# Patient Record
Sex: Female | Born: 1940 | Race: Black or African American | Hispanic: No | Marital: Married | Smoking: Never smoker
Health system: Southern US, Community
[De-identification: ages and names within clinical notes are randomized; demographics above are authoritative.]

## PROBLEM LIST (undated history)

## (undated) DIAGNOSIS — R5381 Other malaise: Secondary | ICD-10-CM

## (undated) DIAGNOSIS — G40901 Epilepsy, unspecified, not intractable, with status epilepticus: Secondary | ICD-10-CM

## (undated) DIAGNOSIS — I639 Cerebral infarction, unspecified: Secondary | ICD-10-CM

## (undated) DIAGNOSIS — E872 Acidosis, unspecified: Secondary | ICD-10-CM

## (undated) DIAGNOSIS — I951 Orthostatic hypotension: Secondary | ICD-10-CM

## (undated) DIAGNOSIS — R55 Syncope and collapse: Secondary | ICD-10-CM

## (undated) DIAGNOSIS — N1330 Unspecified hydronephrosis: Secondary | ICD-10-CM

## (undated) DIAGNOSIS — F0390 Unspecified dementia without behavioral disturbance: Secondary | ICD-10-CM

## (undated) DIAGNOSIS — R1310 Dysphagia, unspecified: Secondary | ICD-10-CM

## (undated) DIAGNOSIS — I4892 Unspecified atrial flutter: Secondary | ICD-10-CM

## (undated) DIAGNOSIS — D72829 Elevated white blood cell count, unspecified: Secondary | ICD-10-CM

## (undated) DIAGNOSIS — N189 Chronic kidney disease, unspecified: Secondary | ICD-10-CM

## (undated) DIAGNOSIS — I5032 Chronic diastolic (congestive) heart failure: Secondary | ICD-10-CM

## (undated) DIAGNOSIS — E860 Dehydration: Secondary | ICD-10-CM

## (undated) DIAGNOSIS — E43 Unspecified severe protein-calorie malnutrition: Secondary | ICD-10-CM

## (undated) DIAGNOSIS — J9600 Acute respiratory failure, unspecified whether with hypoxia or hypercapnia: Secondary | ICD-10-CM

## (undated) DIAGNOSIS — M8580 Other specified disorders of bone density and structure, unspecified site: Secondary | ICD-10-CM

## (undated) DIAGNOSIS — H409 Unspecified glaucoma: Secondary | ICD-10-CM

## (undated) DIAGNOSIS — I440 Atrioventricular block, first degree: Secondary | ICD-10-CM

## (undated) DIAGNOSIS — E785 Hyperlipidemia, unspecified: Secondary | ICD-10-CM

## (undated) DIAGNOSIS — R269 Unspecified abnormalities of gait and mobility: Secondary | ICD-10-CM

## (undated) DIAGNOSIS — I10 Essential (primary) hypertension: Secondary | ICD-10-CM

## (undated) DIAGNOSIS — E119 Type 2 diabetes mellitus without complications: Secondary | ICD-10-CM

## (undated) HISTORY — DX: Acidosis, unspecified: E87.20

## (undated) HISTORY — DX: Unspecified dementia without behavioral disturbance: F03.90

## (undated) HISTORY — DX: Hyperlipidemia, unspecified: E78.5

## (undated) HISTORY — DX: Essential (primary) hypertension: I10

## (undated) HISTORY — DX: Chronic kidney disease, unspecified: N18.9

## (undated) HISTORY — DX: Other malaise: R53.81

## (undated) HISTORY — DX: Orthostatic hypotension: I95.1

## (undated) HISTORY — DX: Unspecified atrial flutter: I48.92

## (undated) HISTORY — DX: Elevated white blood cell count, unspecified: D72.829

## (undated) HISTORY — DX: Unspecified glaucoma: H40.9

## (undated) HISTORY — DX: Acute respiratory failure, unspecified whether with hypoxia or hypercapnia: J96.00

## (undated) HISTORY — DX: Dysphagia, unspecified: R13.10

## (undated) HISTORY — PX: ABDOMINAL HYSTERECTOMY: SHX81

## (undated) HISTORY — DX: Unspecified severe protein-calorie malnutrition: E43

## (undated) HISTORY — DX: Cerebral infarction, unspecified: I63.9

## (undated) HISTORY — DX: Unspecified abnormalities of gait and mobility: R26.9

## (undated) HISTORY — DX: Type 2 diabetes mellitus without complications: E11.9

## (undated) HISTORY — DX: Acidosis: E87.2

## (undated) HISTORY — DX: Chronic diastolic (congestive) heart failure: I50.32

## (undated) HISTORY — DX: Unspecified hydronephrosis: N13.30

## (undated) HISTORY — DX: Epilepsy, unspecified, not intractable, with status epilepticus: G40.901

## (undated) HISTORY — DX: Other specified disorders of bone density and structure, unspecified site: M85.80

## (undated) HISTORY — DX: Atrioventricular block, first degree: I44.0

## (undated) HISTORY — DX: Dehydration: E86.0

---

## 1999-11-08 ENCOUNTER — Encounter: Payer: Self-pay | Admitting: Family Medicine

## 1999-11-08 ENCOUNTER — Ambulatory Visit (HOSPITAL_COMMUNITY): Admission: RE | Admit: 1999-11-08 | Discharge: 1999-11-08 | Payer: Self-pay | Admitting: Family Medicine

## 1999-11-14 ENCOUNTER — Encounter: Payer: Self-pay | Admitting: Family Medicine

## 1999-11-14 ENCOUNTER — Ambulatory Visit (HOSPITAL_COMMUNITY): Admission: RE | Admit: 1999-11-14 | Discharge: 1999-11-14 | Payer: Self-pay | Admitting: Family Medicine

## 1999-11-24 ENCOUNTER — Ambulatory Visit (HOSPITAL_COMMUNITY): Admission: RE | Admit: 1999-11-24 | Discharge: 1999-11-24 | Payer: Self-pay | Admitting: Family Medicine

## 1999-11-24 ENCOUNTER — Encounter: Payer: Self-pay | Admitting: Family Medicine

## 2003-11-07 ENCOUNTER — Emergency Department (HOSPITAL_COMMUNITY): Admission: EM | Admit: 2003-11-07 | Discharge: 2003-11-07 | Payer: Self-pay | Admitting: Emergency Medicine

## 2006-10-04 ENCOUNTER — Other Ambulatory Visit: Admission: RE | Admit: 2006-10-04 | Discharge: 2006-10-04 | Payer: Self-pay | Admitting: Family Medicine

## 2008-06-03 ENCOUNTER — Inpatient Hospital Stay (HOSPITAL_COMMUNITY)
Admission: RE | Admit: 2008-06-03 | Discharge: 2008-06-11 | Payer: Self-pay | Admitting: Physical Medicine & Rehabilitation

## 2008-06-03 ENCOUNTER — Ambulatory Visit: Payer: Self-pay | Admitting: Physical Medicine & Rehabilitation

## 2008-07-20 ENCOUNTER — Encounter
Admission: RE | Admit: 2008-07-20 | Discharge: 2008-07-20 | Payer: Self-pay | Admitting: Physical Medicine & Rehabilitation

## 2008-08-02 ENCOUNTER — Encounter
Admission: RE | Admit: 2008-08-02 | Discharge: 2008-10-31 | Payer: Self-pay | Admitting: Physical Medicine & Rehabilitation

## 2008-11-09 ENCOUNTER — Encounter
Admission: RE | Admit: 2008-11-09 | Discharge: 2008-11-11 | Payer: Self-pay | Admitting: Physical Medicine & Rehabilitation

## 2011-05-15 NOTE — Discharge Summary (Signed)
Dana Blair NO.:  [id number]   MEDICAL RECORD NO.:  [id number]          PATIENT TYPE:  IPS   LOCATION:  4004                         FACILITY:  [facility]   PHYSICIAN:  Dana Blair, M.D.   DATE OF BIRTH:  Apr 11, 1941   DATE OF ADMISSION:  06/03/2008  DATE OF DISCHARGE:  06/11/2008                               DISCHARGE SUMMARY   DISCHARGE DIAGNOSES:  1. Right caudate hemorrhage.  2. Dysphagia.  3. Diabetes mellitus.  4. Hypertension.  5. Hyperlipidemia.   HISTORY OF PRESENT ILLNESS:  This is a 70 year old [place] American  female with history of diabetes mellitus, the patient from [place],  who is on vacation in [place], [place] who was admitted to  [facility] in [place] with altered mental status  collapsed on 05/22/2008.  She was intubated in the emergency department.  Noted blood sugar of 862, hemoglobin A1c of 12.3.  Cranial CT scan  showed a right caudate hemorrhage with some intraventricular extension.  She was subsequently transferred to [facility] for management of  intracerebral hemorrhage.  Neurosurgery, Dr. Lyman Blair with conservative  care.  Followup cranial CT scan with subtle enlargement of the  ventricles, overall improved.  Blood pressures monitored with clonidine,  lisinopril, and Lopressor.  She was on a puree diet.   PAST MEDICAL HISTORY:  See discharge diagnoses.  No alcohol or tobacco.   ALLERGIES:  None.   SOCIAL HISTORY:  Married, lives with her husband, one level home with  family assistance.   MEDICATIONS:  Medications prior to admission, the patient could not  recall.   HOSPITAL COURSE:  The patient was admitted to inpatient rehab services  where therapy was initiated on a 3-hour daily basis consisting of  physical therapy, occupational therapy, speech therapy, and  rehabilitation nursing.  The following issues were addressed during the  patient's rehabilitation stay.  Pertaining to Ms.  Bergen's right  caudate hemorrhage, she continued to participate fully with her  therapies, progressing nicely.  She was minimal assist to supervision  without the use of assistive device for her mobility, supervision for  activities of daily living with moderate cues secondary to some  decreased cognition.  A safety belt initially was in place for her  overall safety.  Her diet was advanced to a regular thin liquid which  she tolerated well.  Her blood pressures were monitored while on  clonidine, Lopressor, and lisinopril.  She had no orthostatic changes.  However, she was stressed to follow up with Dr. Everardo Blair of [facility].  Her blood sugars had some mild elevations.  Her Glucophage had been increased to 850 mg twice daily on June 10, 2008.  She had been on statin drugs in the past.  However, old paperwork  had showed that this was discontinued due to a low LDL.  Again, she  would followup with her primary MD.   LABORATORY DATA:  Overall, her latest labs showed sodium 139, potassium  3.5, BUN 8, creatinine 0.7, hemoglobin 11.7, hematocrit 34.4, WBC of  10.1, and platelet 318,000.  Full family teaching was  completed and she  was discharged to home.   DISCHARGE MEDICATIONS:  1. Glucophage 850 mg twice daily.  2. Clonidine 0.2 mg 3 times daily.  3. Folic acid 1 mg twice daily.  4. Lopressor 50 mg every 6 hours.  5. Multivitamin daily.  6. Vitamin C 2 tablets daily.  7. Lisinopril 20 mg daily.  8. Amaryl 2 mg 3 tablets daily.   DIET:  Diabetic diet.   SPECIAL INSTRUCTIONS:  No drinking.  No driving.  No smoking.   FOLLOWUP:  Dr. Everardo Blair of [facility], Dr. Ellwood Blair  appointment to be made at the outpatient rehab service office.      Dana Blair, P.A.    ______________________________  Dana Blair, M.D.    DA/MEDQ  D:  06/10/2008  T:  06/10/2008  Job:  [id number]   cc:   Dana Signs A. Dana All, MD

## 2011-05-15 NOTE — H&P (Signed)
NAME:  Dana Blair, NOVAK NO.:  [id number]   MEDICAL RECORD NO.:  [id number]          PATIENT TYPE:  IPS   LOCATION:  4004                         FACILITY:  [facility]   PHYSICIAN:  Ellwood Dense, M.D.   DATE OF BIRTH:  November 12, 1941   DATE OF ADMISSION:  06/03/2008  DATE OF DISCHARGE:                              HISTORY & PHYSICAL   ADMITTING PHYSICIAN:  Herold Harms, MD   PRIMARY CARE PHYSICIAN:  Dr. Everardo All with .   HISTORY OF PRESENT ILLNESS:  Dana Blair is a 70 year old African  American female with history of diabetes mellitus and hypertension.  She  lives in [place] but was on vacation in the [place] part of the  state.  She [place] [facility] [place] with altered [place] status after having collapsed on May 22, 2008.  She was intubated in the emergency department with a blood sugar  of 862 and hemoglobin A1c of 12.3.  There was no report about initial  blood pressure on admission.   Cranial CT was positive for right caudate hemorrhage with some  intraventricular extension.  She was subsequently transferred to [facility] in [place] for management of intracranial hemorrhage.  She  was seen by Dr. Lyman Bishop, and they recommended conservative care from a  neurosurgical standpoint.  Followup cranial CT showed subtle enlargement  of ventricles with intraventricular component of blood being diminished.  Blood pressure was monitored on clonidine, lisinopril, and metoprolol.  She was on a pureed diet with a modified barium swallow to be completed.  She has had recent history of dyslipidemia but her statin was  discontinued secondary to low LDL level of 50.   The patient was evaluated by the rehabilitation physicians and felt to  be an appropriate candidate for inpatient rehabilitation.   REVIEW OF SYSTEMS:  Positive for reflux.   PAST MEDICAL HISTORY:  1. Hypertension.  2. Diabetes mellitus.  3.  Dyslipidemia.   FAMILY HISTORY:  Noncontributory.   SOCIAL HISTORY:  The patient is married, lives with her husband in a one  level-home locally in [place].  She does not use alcohol or tobacco.   FUNCTIONAL HISTORY PRIOR TO ADMISSION:  Independent per the patient's  reports.   ALLERGIES:  No known drug allergies.   MEDICATIONS:  Unknown at this time.   LABORATORY:  Recent hemoglobin was 12.2, hematocrit of 37.4, platelet  count of 185,000, and white count of 8.4.  Recent total cholesterol was  118 with LDL cholesterol of 50 as noted above.  Recent sodium was 134,  potassium 3.7, chloride 100, bicarbonate 30, BUN 12, and creatinine 1.0.   PHYSICAL EXAMINATION:  GENERAL:  Reasonably well-appearing elderly adult  female lying in bed in mild-to-no acute discomfort.  VITAL SIGNS:  Blood pressure is 204/92 with a pulse 78, respiratory rate  18, and temperature 98.8 with O2 saturation 98% on room air.  Weight was  58.5 kg.  HEENT:  Normocephalic and nontraumatic.  LUNGS:  Clear to auscultation bilaterally.  CARDIOVASCULAR:  Regular rate and rhythm, S1 and S2 without murmurs.  ABDOMEN:  Soft and nontender with positive bowel sounds.  NEUROLOGIC:  Alert and oriented x1 without cues and 2-3 with several  cues.  She was able to answer only the most simple questions and was not  sure that she was in [facility] or why she was there at the  time of this health problems.  She is unable to give any significant  medical history.  EXTREMITY:  Bilateral upper extremity exam shows 4-/5 strength  throughout.  Bulk and tone are normal and reflexes are 2+ and  symmetrical.  Sensation is intact to light touch throughout the  bilateral upper extremities.  Lower extremity exam shows 3+ to 4- over 5  strength throughout.  She had decreased coordinating movements and  decreased standing balance.  Sensation appears intact to light touch  throughout the bilateral upper and lower  extremities.   DIAGNOSES:  1. Status post right caudate hemorrhage, likely secondary to poorly      controlled hypertension.  2. Poorly controlled non-insulin dependent diabetes per admission labs      and hemoglobin A1c levels.  3. Dyslipidemia.   Presently, the patient has deficits in ADLs, transfers, ambulation,  cognition related to the above-noted right caudate hemorrhage.  She also  has a history of significant hypertension and diabetes mellitus with  probable poor control at home.   PLAN:  1. Admit to the rehabilitation unit for daily therapies to include      physical therapy for range of motion, strengthening, bed mobility,      transfers, pre-gait training, gait training, and equipment      evaluation.  2. Occupational therapy for range of motion, strengthening, ADLs,      cognitive/perceptual training, splinting and equipment evaluation.  3. Rehab nursing for skin care, wound care, and bowel and bladder      training as necessary.  4. Speech therapy for higher level communication and evaluation of      swallow as necessary with evaluation of communication aids.  5. Case management to assess home environment, assist with discharge      planning, and arrange for appropriate followup care.  6. Social work to assess family and social support and assist in      discharge planning.  7. Continue pureed, high-carbohydrate modified thin liquid diet.  8. Check admission labs including CBC and CMET in a.m. of June 04, 2008.  9. CBGs a.c. and at night.  10.Sliding-scale NovoLog insulin for blood sugars greater than 151.  11.Side rails up x4 for safety.  12.Vitamin C 1000 mg p.o. daily.  13.Clonidine 0.2 mg p.o. t.i.d.  14.Pepcid 20 mg p.o. b.i.d.  15.Folic acid 1 mg p.o. b.i.d.  16.Glimepiride 4 mg p.o. daily.  17.Lisinopril 10 mg p.o. daily.  18.Metformin 500 mg p.o. b.i.d.  19.Lopressor 50 mg p.o. q.6 h., holding for heart rate less than 60.  20.Multivitamin one p.o.  daily.  21.Routine turning to prevent skin breakdown.  22.Keep Foley tube for now.  23.Nystatin powder to sacral areas b.i.d. and p.r.n.  24.Extra barrier cream b.i.d. and p.r.n. to sacral area.   PROGNOSIS:  Fair.   ESTIMATED LENGTH OF STAY:  15-20 days.   GOALS:  Standby assist for ADLs, transfers, and ambulation.           ______________________________  Ellwood Dense, M.D.     DC/MEDQ  D:  06/04/2008  T:  06/04/2008  Job:  [id number]

## 2011-09-27 LAB — COMPREHENSIVE METABOLIC PANEL
ALT: 15
AST: 20
Albumin: 2.1 — ABNORMAL LOW
Alkaline Phosphatase: 51
BUN: 5 — ABNORMAL LOW
CO2: 32
Calcium: 8.3 — ABNORMAL LOW
Chloride: 102
Creatinine, Ser: 0.79
GFR calc Af Amer: >60
GFR calc non Af Amer: >60
Glucose, Bld: 130 — ABNORMAL HIGH
Potassium: 3 — ABNORMAL LOW
Sodium: 138
Total Bilirubin: 0.4
Total Protein: 5.1 — ABNORMAL LOW

## 2011-09-27 LAB — BASIC METABOLIC PANEL
CO2: 31
Chloride: 103
GFR calc Af Amer: >60
Glucose, Bld: 174 — ABNORMAL HIGH
Potassium: 3.5
Sodium: 139

## 2011-09-27 LAB — DIFFERENTIAL
Lymphocytes Relative: 13
Lymphs Abs: 1.3
Monocytes Relative: 7
Neutro Abs: 8 — ABNORMAL HIGH
Neutrophils Relative %: 79 — ABNORMAL HIGH

## 2011-09-27 LAB — CBC
HCT: 34.4 — ABNORMAL LOW
Hemoglobin: 11.7 — ABNORMAL LOW
MCHC: 34
MCV: 86
Platelets: 318
RBC: 4
RDW: 13.9
WBC: 10.1

## 2013-10-28 ENCOUNTER — Telehealth: Payer: Self-pay | Admitting: Cardiology

## 2013-10-28 NOTE — Telephone Encounter (Signed)
Walk In pt Form " Pt Needs Samples of Meds" gave to [facility]  10/28/13/KM

## 2013-10-30 ENCOUNTER — Telehealth: Payer: Self-pay

## 2013-10-30 NOTE — Telephone Encounter (Signed)
Gave samples to patient's son

## 2013-10-30 NOTE — Telephone Encounter (Signed)
Sent an assistance program application to patient for bystolic and vyitron for her son to fill out and return

## 2013-12-04 ENCOUNTER — Telehealth: Payer: Self-pay

## 2013-12-07 ENCOUNTER — Other Ambulatory Visit: Payer: Self-pay | Admitting: Cardiology

## 2013-12-07 MED ORDER — CLONIDINE HCL 0.1 MG PO TABS: 0.0500 mg | ORAL_TABLET | Freq: Two times a day (BID) | ORAL | Status: DC

## 2013-12-10 NOTE — Telephone Encounter (Signed)
refill 

## 2013-12-30 ENCOUNTER — Telehealth: Payer: Self-pay

## 2013-12-30 NOTE — Telephone Encounter (Signed)
[name] came to office to get samples of bystolic and vytorin

## 2014-01-08 ENCOUNTER — Telehealth: Payer: Self-pay | Admitting: *Deleted

## 2014-01-08 NOTE — Telephone Encounter (Signed)
Patient needs refill on amlodipine and lisinopril to be sent to [facility] on [address]. Thanks, MI

## 2014-01-12 MED ORDER — LISINOPRIL-HYDROCHLOROTHIAZIDE 20-12.5 MG PO TABS: 1.0000 | ORAL_TABLET | Freq: Every day | ORAL | Status: DC

## 2014-01-12 MED ORDER — AMLODIPINE BESYLATE 10 MG PO TABS: 10.0000 mg | ORAL_TABLET | Freq: Every day | ORAL | Status: DC

## 2014-05-14 ENCOUNTER — Telehealth: Payer: Self-pay

## 2014-05-14 NOTE — Telephone Encounter (Signed)
[name] came to office to pick up bystolic and vytorin gave them to him at the front

## 2014-06-17 ENCOUNTER — Telehealth: Payer: Self-pay | Admitting: Cardiology

## 2014-06-17 NOTE — Telephone Encounter (Signed)
[name] came by to get Bystolic and Vitorin samples.  He said he would come back later after you call him.

## 2014-06-18 ENCOUNTER — Telehealth: Payer: Self-pay

## 2014-06-18 NOTE — Telephone Encounter (Signed)
Pt has not been sen in this office.verified dosage of pt bystolic 20mg  daily  and vytorin 10-20mg  daily .pt [facility]. pt son walked in. Samples of bystolic and vytorin given.

## 2014-06-23 ENCOUNTER — Encounter: Payer: Self-pay | Admitting: Cardiology

## 2014-07-01 ENCOUNTER — Telehealth: Payer: Self-pay

## 2014-07-01 NOTE — Telephone Encounter (Signed)
Patient son came to office to pick up bystolic 20 mg and vytorin 10/20 mg placed up front

## 2014-07-16 ENCOUNTER — Ambulatory Visit: Payer: Self-pay | Admitting: Cardiology

## 2014-07-28 ENCOUNTER — Telehealth: Payer: Self-pay

## 2014-07-28 ENCOUNTER — Other Ambulatory Visit: Payer: Self-pay

## 2014-07-28 MED ORDER — AMLODIPINE BESYLATE 10 MG PO TABS: 10.0000 mg | ORAL_TABLET | Freq: Every day | ORAL | Status: DC

## 2014-07-28 NOTE — Telephone Encounter (Signed)
Yes just give enough till pts scheduled appt.

## 2014-08-13 ENCOUNTER — Encounter: Payer: Self-pay | Admitting: Cardiology

## 2014-08-13 ENCOUNTER — Ambulatory Visit (INDEPENDENT_AMBULATORY_CARE_PROVIDER_SITE_OTHER): Payer: Medicare Other | Admitting: Cardiology

## 2014-08-13 VITALS — BP 132/76 | HR 70 | Ht 64.0 in | Wt 134.0 lb

## 2014-08-13 DIAGNOSIS — I340 Nonrheumatic mitral (valve) insufficiency: Secondary | ICD-10-CM | POA: Insufficient documentation

## 2014-08-13 DIAGNOSIS — I10 Essential (primary) hypertension: Secondary | ICD-10-CM

## 2014-08-13 DIAGNOSIS — I059 Rheumatic mitral valve disease, unspecified: Secondary | ICD-10-CM

## 2014-08-13 DIAGNOSIS — I440 Atrioventricular block, first degree: Secondary | ICD-10-CM

## 2014-08-13 DIAGNOSIS — Z8673 Personal history of transient ischemic attack (TIA), and cerebral infarction without residual deficits: Secondary | ICD-10-CM

## 2014-08-13 DIAGNOSIS — E785 Hyperlipidemia, unspecified: Secondary | ICD-10-CM

## 2014-08-13 DIAGNOSIS — E119 Type 2 diabetes mellitus without complications: Secondary | ICD-10-CM | POA: Insufficient documentation

## 2014-08-13 NOTE — Progress Notes (Signed)
[address] [place]  [address] Phone: [phone] Fax:  [phone]  Date:  08/13/2014   ID:  Dana LeylandBarbara J Swander, DOB Jul 31, 1941, MRN [id number]  PCP:  Leanor RubensteinSUN,VYVYAN Y, MD   History of Present Illness: Scharlene GlossBarbara J Beightol is a 73 y.o. female with difficult to control hypertension, diabetes, prior CVA, hyperlipidemia here for followup hypertension.  Hypertension-very well controlled on multidrug regimen. She does have a mild first degree AV block on EKG and she is on beta blocker. We will carefully monitor. She's not having any syncopal symptoms or dizziness.  Murmur-echocardiogram performed on 7/13 demonstrated mild MR/TR. She has mild LVH with EF of 70%. Overall reassuring.  Hyperlipidemia-currently very well controlled with LDL of 85. Continuing with Vytorin     Wt Readings from Last 3 Encounters:  08/13/14 134 lb (60.782 kg)     Past Medical History  Diagnosis Date  . Diabetes     Diabetes mellitus with chronic kidney disease   . Hypertension     [facility]- ECHO 06/29/09 - LVH, normal EF, renal ultrasound-no renal artery stenosis  . Hyperlipidemia   . 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 She had concomitant respiratory failure. On May 22, 2008, CT of the head without contrast demonstrated a 9 x 20 mm acute hematoma within the head of the right caudate with intraventricular extension of hemorrhage. Neurosurgery consult-no ventriculostomy.  . Glaucoma   . Osteopenia   . Chronic kidney disease     Past Surgical History  Procedure Laterality Date  . Other surgical history  1970    Hysterectomy    Current Outpatient Prescriptions  Medication Sig Dispense Refill  . ALENDRONATE SODIUM PO Take by mouth.      Marland Kitchen. amLODipine (NORVASC) 10 MG tablet Take 1 tablet (10 mg total) by mouth daily.  30 tablet  0  . aspirin 81 MG tablet Take 81 mg by mouth daily.      . cloNIDine (CATAPRES) 0.1 MG tablet Take 0.5 tablets (0.05 mg total) by mouth  2 (two) times daily.  30 tablet  6  . Ezetimibe-Simvastatin (VYTORIN PO) Take by mouth.      Marland Kitchen. GLIPIZIDE PO Take by mouth.      Marland Kitchen. lisinopril-hydrochlorothiazide (PRINZIDE) 20-12.5 MG per tablet Take 1 tablet by mouth daily.  30 tablet  5  . Nebivolol HCl (BYSTOLIC PO) Take by mouth.      . SitaGLIPtin-MetFORMIN HCl (JANUMET PO) Take by mouth.       No current facility-administered medications for this visit.    Allergies:   No Known Allergies  Social History:  The patient  reports that she has never smoked. She does not have any smokeless tobacco history on file.   Family History  Problem Relation Age of Onset  . Diabetes Mother   . Diabetes Father   . Diabetes Sister     ROS:  Please see the history of present illness.   Denies any new strokelike symptoms, fevers, chills, orthopnea, PND   All other systems reviewed and negative.   PHYSICAL EXAM: VS:  BP 132/76  Pulse 70  Ht 5\' 4"  (1.626 m)  Wt 134 lb (60.782 kg)  BMI 22.99 kg/m2 Well nourished, well developed, in no acute distress HEENT: normal, [place]/AT, EOMI Neck: no JVD, normal carotid upstroke, no bruit Cardiac:  normal S1, S2; RRR; 2/6 apical systolic murmur Lungs:  clear to auscultation bilaterally, no  wheezing, rhonchi or raleskyphosis Abd: soft, nontender, no hepatomegaly, no bruits Ext: no edema, 2+ distal pulses Skin: warm and dry GU: deferred Neuro: no focal abnormalities noted, AAO x 3  EKG:  08/13/14-sinus rhythm, first degree AV block, PR interval 214 ms, poor R-wave progression    Labs: A1c-6.2, LDL 85  ASSESSMENT AND PLAN:  1. History of stroke-secondary prevention medications as above. No significant symptoms. 2. Hypertension-previously difficult to control. Currently doing very well on this regimen. 3. Mitral regurgitation-mild. Mild tricuspid regurgitation as well. Should be of no clinical significance. 4. Diabetes-well controlled. Dr. [name]. 5. Hyperlipidemia -- Vytorin 10/20. LDL 85. Continue. Post  stroke. 6. 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 Continue to monitor. I'm fine with her continuing with beta blocker. 7. One-year followup.  Signed, [name], MD [facility]  08/13/2014 9:03 AM

## 2014-08-13 NOTE — Patient Instructions (Signed)
The current medical regimen is effective;  continue present plan and medications.  Follow up in 1 year with Dr Skains.  You will receive a letter in the mail 2 months before you are due.  Please call us when you receive this letter to schedule your follow up appointment.  

## 2014-08-27 ENCOUNTER — Other Ambulatory Visit: Payer: Self-pay | Admitting: *Deleted

## 2014-08-27 MED ORDER — AMLODIPINE BESYLATE 10 MG PO TABS: 10.0000 mg | ORAL_TABLET | Freq: Every day | ORAL | Status: DC

## 2014-09-28 NOTE — Progress Notes (Signed)
Patient ID: [name], female   DOB: 1941-09-25, 73 y.o.   MRN: [id number] Patient son came to office to get samples of bystolic and vytorin. I gave a month of vytorin and just two weeks of bystolic this is all we had

## 2014-10-15 ENCOUNTER — Telehealth: Payer: Self-pay

## 2014-10-15 NOTE — Telephone Encounter (Signed)
Patient 's son came to office to pick up vytorin and bystolic, we have no bystolic but I gave him a month of vytorin

## 2014-10-29 ENCOUNTER — Telehealth: Payer: Self-pay | Admitting: *Deleted

## 2014-10-29 NOTE — Telephone Encounter (Signed)
Bystolic samples placed at the front desk for patient. 

## 2014-10-30 ENCOUNTER — Other Ambulatory Visit: Payer: Self-pay

## 2014-10-30 MED ORDER — CLONIDINE HCL 0.1 MG PO TABS: 0.0500 mg | ORAL_TABLET | Freq: Two times a day (BID) | ORAL | Status: DC

## 2014-11-02 ENCOUNTER — Other Ambulatory Visit: Payer: Self-pay

## 2014-11-02 MED ORDER — CLONIDINE HCL 0.1 MG PO TABS: 0.0500 mg | ORAL_TABLET | Freq: Two times a day (BID) | ORAL | Status: DC

## 2014-11-19 ENCOUNTER — Other Ambulatory Visit: Payer: Self-pay

## 2014-11-19 NOTE — Telephone Encounter (Signed)
Patient son came to the office to get samples of vytorin and bystolic. I gave him samples of vytorin but we were out of bystolic

## 2014-12-03 ENCOUNTER — Telehealth: Payer: Self-pay

## 2014-12-03 NOTE — Telephone Encounter (Signed)
[name] came to office to pick up samples of bystoilc

## 2015-02-25 ENCOUNTER — Telehealth: Payer: Self-pay

## 2015-02-25 NOTE — Telephone Encounter (Signed)
Patient son came to office  to pick up samples of vytorin 10/20 and bystolic 20 mg gave to son at front desk

## 2015-03-09 ENCOUNTER — Other Ambulatory Visit: Payer: Self-pay | Admitting: Cardiology

## 2015-06-15 ENCOUNTER — Encounter: Payer: Self-pay | Admitting: Cardiology

## 2015-06-21 ENCOUNTER — Other Ambulatory Visit: Payer: Self-pay

## 2015-06-21 MED ORDER — NEBIVOLOL HCL 20 MG PO TABS: 20.0000 mg | ORAL_TABLET | Freq: Every day | ORAL | Status: DC

## 2015-06-21 MED ORDER — EZETIMIBE-SIMVASTATIN 10-20 MG PO TABS
1.0000 | ORAL_TABLET | Freq: Every day | ORAL | Status: DC
Start: 2015-06-21 — End: 2015-10-02

## 2015-06-21 NOTE — Telephone Encounter (Signed)
[name] walked in today requesting samples for her. He requested Bystolic 20mg  and Vytorin 10-20mg . We have neither. I told him we could call them in to a pharmacy for her. He is agreeable to that. Also noted that she will need an appointment in August with Dr. Anne Fu. Said he would schedule it for her.

## 2015-07-07 ENCOUNTER — Encounter: Payer: Self-pay | Admitting: *Deleted

## 2015-07-26 ENCOUNTER — Emergency Department (HOSPITAL_COMMUNITY): Payer: Medicare Other

## 2015-07-26 ENCOUNTER — Encounter (HOSPITAL_COMMUNITY): Payer: Self-pay | Admitting: *Deleted

## 2015-07-26 ENCOUNTER — Inpatient Hospital Stay (HOSPITAL_COMMUNITY)
Admission: EM | Admit: 2015-07-26 | Discharge: 2015-08-02 | DRG: 682 | Disposition: A | Discharge status: Home Health | Payer: Medicare Other | Attending: Internal Medicine | Admitting: Internal Medicine

## 2015-07-26 DIAGNOSIS — I959 Hypotension, unspecified: Secondary | ICD-10-CM

## 2015-07-26 DIAGNOSIS — E872 Acidosis, unspecified: Secondary | ICD-10-CM | POA: Diagnosis present

## 2015-07-26 DIAGNOSIS — I5032 Chronic diastolic (congestive) heart failure: Secondary | ICD-10-CM | POA: Diagnosis present

## 2015-07-26 DIAGNOSIS — E876 Hypokalemia: Secondary | ICD-10-CM | POA: Diagnosis present

## 2015-07-26 DIAGNOSIS — H409 Unspecified glaucoma: Secondary | ICD-10-CM | POA: Diagnosis present

## 2015-07-26 DIAGNOSIS — M6282 Rhabdomyolysis: Secondary | ICD-10-CM | POA: Diagnosis present

## 2015-07-26 DIAGNOSIS — R195 Other fecal abnormalities: Secondary | ICD-10-CM | POA: Diagnosis present

## 2015-07-26 DIAGNOSIS — I517 Cardiomegaly: Secondary | ICD-10-CM | POA: Diagnosis present

## 2015-07-26 DIAGNOSIS — N1330 Unspecified hydronephrosis: Secondary | ICD-10-CM | POA: Diagnosis present

## 2015-07-26 DIAGNOSIS — Z681 Body mass index (BMI) 19 or less, adult: Secondary | ICD-10-CM

## 2015-07-26 DIAGNOSIS — Z7982 Long term (current) use of aspirin: Secondary | ICD-10-CM

## 2015-07-26 DIAGNOSIS — E785 Hyperlipidemia, unspecified: Secondary | ICD-10-CM | POA: Diagnosis present

## 2015-07-26 DIAGNOSIS — Z794 Long term (current) use of insulin: Secondary | ICD-10-CM

## 2015-07-26 DIAGNOSIS — Z8673 Personal history of transient ischemic attack (TIA), and cerebral infarction without residual deficits: Secondary | ICD-10-CM | POA: Diagnosis not present

## 2015-07-26 DIAGNOSIS — N179 Acute kidney failure, unspecified: Principal | ICD-10-CM | POA: Diagnosis present

## 2015-07-26 DIAGNOSIS — R55 Syncope and collapse: Secondary | ICD-10-CM | POA: Diagnosis present

## 2015-07-26 DIAGNOSIS — B9620 Unspecified Escherichia coli [E. coli] as the cause of diseases classified elsewhere: Secondary | ICD-10-CM | POA: Diagnosis present

## 2015-07-26 DIAGNOSIS — E860 Dehydration: Secondary | ICD-10-CM | POA: Diagnosis present

## 2015-07-26 DIAGNOSIS — E119 Type 2 diabetes mellitus without complications: Secondary | ICD-10-CM | POA: Diagnosis not present

## 2015-07-26 DIAGNOSIS — N189 Chronic kidney disease, unspecified: Secondary | ICD-10-CM | POA: Diagnosis present

## 2015-07-26 DIAGNOSIS — E1129 Type 2 diabetes mellitus with other diabetic kidney complication: Secondary | ICD-10-CM

## 2015-07-26 DIAGNOSIS — I951 Orthostatic hypotension: Secondary | ICD-10-CM | POA: Diagnosis present

## 2015-07-26 DIAGNOSIS — E1122 Type 2 diabetes mellitus with diabetic chronic kidney disease: Secondary | ICD-10-CM | POA: Diagnosis present

## 2015-07-26 DIAGNOSIS — I10 Essential (primary) hypertension: Secondary | ICD-10-CM | POA: Diagnosis not present

## 2015-07-26 DIAGNOSIS — I248 Other forms of acute ischemic heart disease: Secondary | ICD-10-CM | POA: Diagnosis present

## 2015-07-26 DIAGNOSIS — N390 Urinary tract infection, site not specified: Secondary | ICD-10-CM | POA: Diagnosis present

## 2015-07-26 DIAGNOSIS — E43 Unspecified severe protein-calorie malnutrition: Secondary | ICD-10-CM

## 2015-07-26 DIAGNOSIS — N1339 Other hydronephrosis: Secondary | ICD-10-CM | POA: Diagnosis not present

## 2015-07-26 DIAGNOSIS — I129 Hypertensive chronic kidney disease with stage 1 through stage 4 chronic kidney disease, or unspecified chronic kidney disease: Secondary | ICD-10-CM | POA: Diagnosis present

## 2015-07-26 DIAGNOSIS — R740 Nonspecific elevation of levels of transaminase and lactic acid dehydrogenase [LDH]: Secondary | ICD-10-CM

## 2015-07-26 DIAGNOSIS — I440 Atrioventricular block, first degree: Secondary | ICD-10-CM | POA: Diagnosis present

## 2015-07-26 DIAGNOSIS — M8580 Other specified disorders of bone density and structure, unspecified site: Secondary | ICD-10-CM | POA: Diagnosis present

## 2015-07-26 DIAGNOSIS — D72829 Elevated white blood cell count, unspecified: Secondary | ICD-10-CM | POA: Diagnosis not present

## 2015-07-26 HISTORY — DX: Syncope and collapse: R55

## 2015-07-26 LAB — URINE MICROSCOPIC-ADD ON

## 2015-07-26 LAB — CBG MONITORING, ED: Glucose-Capillary: 135 mg/dL — ABNORMAL HIGH (ref 65–99)

## 2015-07-26 LAB — BASIC METABOLIC PANEL
ANION GAP: 28 — AB (ref 5–15)
BUN: 42 mg/dL — AB (ref 6–20)
CO2: 14 mmol/L — ABNORMAL LOW (ref 22–32)
CREATININE: 2.59 mg/dL — AB (ref 0.44–1.00)
Calcium: 9.7 mg/dL (ref 8.9–10.3)
Chloride: 99 mmol/L — ABNORMAL LOW (ref 101–111)
GFR, EST AFRICAN AMERICAN: 20 mL/min — AB (ref >60)
GFR, EST NON AFRICAN AMERICAN: 17 mL/min — AB (ref >60)
GLUCOSE: 165 mg/dL — AB (ref 65–99)
Potassium: 5.7 mmol/L — ABNORMAL HIGH (ref 3.5–5.1)
Sodium: 141 mmol/L (ref 135–145)

## 2015-07-26 LAB — CBC
HEMATOCRIT: 44.9 % (ref 36.0–46.0)
HEMOGLOBIN: 14.6 g/dL (ref 12.0–15.0)
MCH: 28.1 pg (ref 26.0–34.0)
MCHC: 32.5 g/dL (ref 30.0–36.0)
MCV: 86.3 fL (ref 78.0–100.0)
PLATELETS: 333 10*3/uL (ref 150–400)
RBC: 5.2 MIL/uL — AB (ref 3.87–5.11)
RDW: 13.8 % (ref 11.5–15.5)
WBC: 14.2 10*3/uL — AB (ref 4.0–10.5)

## 2015-07-26 LAB — URINALYSIS, ROUTINE W REFLEX MICROSCOPIC
BILIRUBIN URINE: NEGATIVE
Glucose, UA: NEGATIVE mg/dL
KETONES UR: NEGATIVE mg/dL
NITRITE: NEGATIVE
PH: 5 (ref 5.0–8.0)
PROTEIN: 100 mg/dL — AB
Specific Gravity, Urine: 1.011 (ref 1.005–1.030)
Urobilinogen, UA: 0.2 mg/dL (ref 0.0–1.0)

## 2015-07-26 LAB — I-STAT CG4 LACTIC ACID, ED
LACTIC ACID, VENOUS: 15.13 mmol/L — AB (ref 0.5–2.0)
Lactic Acid, Venous: 4.01 mmol/L (ref 0.5–2.0)

## 2015-07-26 LAB — I-STAT TROPONIN, ED: TROPONIN I, POC: 0.02 ng/mL (ref 0.00–0.08)

## 2015-07-26 LAB — LIPASE, BLOOD: LIPASE: 64 U/L — AB (ref 22–51)

## 2015-07-26 MED ORDER — SODIUM CHLORIDE 0.9 % IV BOLUS (SEPSIS)
1000.0000 mL | Freq: Once | INTRAVENOUS | Status: AC
  Administered 2015-07-26: 1000 mL via INTRAVENOUS

## 2015-07-26 MED ORDER — EZETIMIBE-SIMVASTATIN 10-20 MG PO TABS: 1.0000 | ORAL_TABLET | Freq: Every day | ORAL | Status: DC

## 2015-07-26 MED ORDER — INSULIN ASPART 100 UNIT/ML ~~LOC~~ SOLN: 0.0000 [IU] | Freq: Three times a day (TID) | SUBCUTANEOUS | Status: DC

## 2015-07-26 MED ORDER — ASPIRIN EC 81 MG PO TBEC
81.0000 mg | DELAYED_RELEASE_TABLET | Freq: Every day | ORAL | Status: DC
  Administered 2015-07-28 – 2015-08-02 (×7): 81 mg via ORAL
  Filled 2015-07-26 (×7): qty 1

## 2015-07-26 MED ORDER — SODIUM CHLORIDE 0.9 % IV SOLN
INTRAVENOUS | Status: DC
  Administered 2015-07-27: via INTRAVENOUS
  Administered 2015-07-27: 1000 mL via INTRAVENOUS

## 2015-07-26 MED ORDER — SODIUM CHLORIDE 0.9 % IV BOLUS (SEPSIS)
500.0000 mL | Freq: Once | INTRAVENOUS | Status: AC
  Administered 2015-07-26: 500 mL via INTRAVENOUS

## 2015-07-26 NOTE — ED Notes (Signed)
CBG 135  

## 2015-07-26 NOTE — ED Notes (Addendum)
Ps son states that she became generalized weak 2 days ago. pts baseline is ambulatory without assistive devices. Son states that he found his mother on the floor at 6p tonight. Pt unsure how long mother was on the floor. States that she got dizzy before she fell. Denies hitting head, denies LOC. Son states that she was so weak he had to pick her up off the floor.

## 2015-07-26 NOTE — H&P (Addendum)
[facility] History and Physical  Dana Blair:[id number] DOB: 22-Jul-1941 DOA: 07/26/2015  Referring physician: EDP PCP: Leanor Rubenstein, MD   Chief Complaint: Fall, weakness   HPI: Dana Blair is a 74 y.o. female who is brought in by son after being found down at home.  Had generalized weakness onset 2 days ago.  At baseline is ambulatory and lives alone.  Patients son found mother on floor at 6pm tonight, unclear how long she was down for.  Patient states she was dizzy before she fell, denies hitting head, denies LOC.  Patient brought in to ED with initially low BPs with SBP in the 60s.  This has improved after 3L IVF.  Review of Systems: Systems reviewed.  As above, otherwise negative  Past Medical History  Diagnosis Date  . Diabetes     Diabetes mellitus with chronic kidney disease   . Hypertension     [facility]- [facility]CHO 06/29/09 - LVH, normal EF, renal ultrasound-no renal artery stenosis  . Hyperlipidemia   . 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 She had concomitant respiratory failure. On May 22, 2008, CT of the head without contrast demonstrated a 9 x 20 mm acute hematoma within the head of the right caudate with intraventricular extension of hemorrhage. Neurosurgery consult-no ventriculostomy.  . Glaucoma   . Osteopenia   . Chronic kidney disease    Past Surgical History  Procedure Laterality Date  . Other surgical history  1970    Hysterectomy  . Abdominal hysterectomy     Social History:  reports that she has never smoked. She does not have any smokeless tobacco history on file. She reports that she does not drink alcohol or use illicit drugs.  No Known Allergies  Family History  Problem Relation Age of Onset  . Diabetes Mother   . Diabetes Father   . Diabetes Sister      Prior to Admission medications   Medication Sig Start Date End Date Taking? Authorizing Provider  alendronate (FOSAMAX) 70 MG tablet Take 70 mg by mouth once a  week. Take with a full glass of water on an empty stomach.   Yes Historical Provider, MD  amLODipine (NORVASC) 10 MG tablet TAKE ONE TABLET BY MOUTH ONCE DAILY 03/10/15  Yes [name], MD  aspirin 81 MG tablet Take 81 mg by mouth daily.   Yes Historical Provider, MD  cloNIDine (CATAPRES) 0.1 MG tablet Take 0.5 tablets (0.05 mg total) by mouth 2 (two) times daily. Patient taking differently: Take 0.05 mg by mouth daily.  11/02/14  Yes [name], MD  ezetimibe-simvastatin (VYTORIN) 10-20 MG per tablet Take 1 tablet by mouth daily. 06/21/15  Yes [name], MD  lisinopril-hydrochlorothiazide (PRINZIDE) 20-12.5 MG per tablet Take 1 tablet by mouth daily. 01/12/14  Yes [name], MD  Nebivolol HCl 20 MG TABS Take 1 tablet (20 mg total) by mouth daily. 06/21/15  Yes [name], MD  sitaGLIPtin-metformin (JANUMET) 50-1000 MG per tablet Take 1 tablet by mouth 2 (two) times daily with a meal.   Yes Historical Provider, MD   Physical Exam: Filed Vitals:   07/26/15 2245  BP: 115/64  Pulse: 94  Temp:   Resp: 18    BP 115/64 mmHg  Pulse 94  Temp(Src) 96.4 F (35.8 C) (Rectal)  Resp 18  SpO2 99%  General Appearance:    Alert, oriented, no distress, appears stated age  Head:  Normocephalic, atraumatic  Eyes:    PERRL, EOMI, sclera non-icteric        Nose:   Nares without drainage or epistaxis. Mucosa, turbinates normal  Throat:   Dry mucous membranes. Oropharynx without erythema or exudate.  Neck:   Supple. No carotid bruits.  No thyromegaly.  No lymphadenopathy.   Back:     No CVA tenderness, no spinal tenderness  Lungs:     Clear to auscultation bilaterally, without wheezes, rhonchi or rales  Chest wall:    No tenderness to palpitation  Heart:    Regular rate and rhythm without murmurs, gallops, rubs  Abdomen:     Soft, non-tender, nondistended, normal bowel sounds, no organomegaly  Genitalia:    deferred  Rectal:    deferred  Extremities:   No clubbing, cyanosis or edema.   Pulses:   2+ and symmetric all extremities  Skin:   Skin color, texture, turgor normal, no rashes or lesions  Lymph nodes:   Cervical, supraclavicular, and axillary nodes normal  Neurologic:   CNII-XII intact. Normal strength, sensation and reflexes      throughout    Labs on Admission:  Basic Metabolic Panel:  Recent Labs Lab 07/26/15 1858  NA 141  K 5.7*  CL 99*  CO2 14*  GLUCOSE 165*  BUN 42*  CREATININE 2.59*  CALCIUM 9.7   Liver Function Tests:  Recent Labs Lab 07/26/15 1858  AST 225*  ALT <5*  ALKPHOS 82  BILITOT 2.2*  PROT 8.4*  ALBUMIN 3.4*    Recent Labs Lab 07/26/15 1858  LIPASE 64*   No results for input(s): AMMONIA in the last 168 hours. CBC:  Recent Labs Lab 07/26/15 1858  WBC 14.2*  HGB 14.6  HCT 44.9  MCV 86.3  PLT 333   Cardiac Enzymes: No results for input(s): CKTOTAL, CKMB, CKMBINDEX, TROPONINI in the last 168 hours.  BNP (last 3 results) No results for input(s): PROBNP in the last 8760 hours. CBG:  Recent Labs Lab 07/26/15 1837  GLUCAP 135*    Radiological Exams on Admission: Dg Chest Portable 1 View  07/26/2015   CLINICAL DATA:  Mallet pain for 1 week.  Hypotension today.  EXAM: PORTABLE CHEST - 1 VIEW  COMPARISON:  None.  FINDINGS: The lungs are clear. Heart size is normal. No pneumothorax or pleural effusion.  IMPRESSION: No acute disease.   Electronically Signed   By: Drusilla Kanner M.D.   On: 07/26/2015 19:15    EKG: Independently reviewed.  Assessment/Plan Principal Problem:   AKI (acute kidney injury) Active Problems:   Essential hypertension   DM2 (diabetes mellitus, type 2)   Dehydration   Occult blood positive stool   1. AKI - due to profound dehydration and possibly rhabdomyolysis 1. Large HGB in urine, only 3-6 RBC, suspicious for rhabdo.  Holding statin and checking CPK.  Is already receiving large volumes of IVF. 2. CPK ordered and pending 3. Repeat BMP in AM 4. Intake and  output 5.  2. Dehydration - 1. improved after 3L of IVF 2. given another 500 CC bolus for SBP in the 90s 3. continuous IVF at 125 cc/hr 3. Lactic acidosis -  1. Initially lactate of 15, this improves down to 4 after 3L IVF suggesting dehydration as the cause. 2. Doesn't really appear toxic or septic at this point, no obvious source of infection on exam. 4. Occult blood positive stool - 1. HGB is NL at 13 2. Repeat in AM 3. No stigmata of  massive of frank GIB as primary cause for todays presentation at this point (no hemetemesis, melena, hematochezia, etc). 4. SCDs only for DVT ppx. 5. DM2 - 1. Hold metformin given AKI 2. SSI sensitive scale AC/HS 6. Transaminitis? 1. AST of 225, ALT < 5... Repeat in AM, again am suspicious that this may be related to rhabdo.    Code Status: Full Code  Family Communication: No family in room Disposition Plan: Admit to SDU   Time spent: 70 min  GARDNER, JARED M. [facility] Pager [phone]  If 7AM-7PM, please contact the day team taking care of the patient [email or web address] Password [facility] 07/26/2015, 11:22 PM

## 2015-07-26 NOTE — ED Provider Notes (Signed)
CSN: [id number]     Arrival date & time 07/26/15  1811 History   First MD Initiated Contact with Patient 07/26/15 1838     Chief Complaint  Patient presents with  . Fall  . Weakness   (Consider location/radiation/quality/duration/timing/severity/associated sxs/prior Treatment) Patient is a 74 y.o. female presenting with general illness. The history is provided by the patient and a relative. No language interpreter was used.  Illness Location:  Diffuse weakness Severity:  Moderate Onset quality:  Gradual Timing:  Constant Progression:  Worsening Chronicity:  New Context:  At rest Relieved by:  Nothing tried Worsened by:  Nothing tried Ineffective treatments:  Nothing tried Associated symptoms: fatigue and myalgias   Associated symptoms: no abdominal pain, no chest pain, no cough, no diarrhea, no fever, no headaches, no nausea, no rash, no shortness of breath and no vomiting     Past Medical History  Diagnosis Date  . Diabetes     Diabetes mellitus with chronic kidney disease   . Hypertension     [facility]- [facility] 06/29/09 - LVH, normal EF, renal ultrasound-no renal artery stenosis  . Hyperlipidemia   . 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 She had concomitant respiratory failure. On May 22, 2008, CT of the head without contrast demonstrated a 9 x 20 mm acute hematoma within the head of the right caudate with intraventricular extension of hemorrhage. Neurosurgery consult-no ventriculostomy.  . Glaucoma   . Osteopenia   . Chronic kidney disease    Past Surgical History  Procedure Laterality Date  . Other surgical history  1970    Hysterectomy  . Abdominal hysterectomy     Family History  Problem Relation Age of Onset  . Diabetes Mother   . Diabetes Father   . Diabetes Sister    History  Substance Use Topics  . Smoking status: Never Smoker   . Smokeless tobacco: Not on file  . Alcohol Use: No   OB History    No data available     Review of Systems   Constitutional: Positive for fatigue. Negative for fever.  Respiratory: Negative for cough, chest tightness and shortness of breath.   Cardiovascular: Negative for chest pain.  Gastrointestinal: Negative for nausea, vomiting, abdominal pain and diarrhea.  Musculoskeletal: Positive for myalgias and gait problem (2/2 diffuse weakness).  Skin: Negative for rash.  Neurological: Positive for weakness (generalized). Negative for light-headedness and headaches.  Psychiatric/Behavioral: Negative for confusion.  All other systems reviewed and are negative.     Allergies  Review of patient's allergies indicates no known allergies.  Home Medications   Prior to Admission medications   Medication Sig Start Date End Date Taking? Authorizing Provider  ALENDRONATE SODIUM PO Take by mouth.    Historical Provider, MD  amLODipine (NORVASC) 10 MG tablet TAKE ONE TABLET BY MOUTH ONCE DAILY 03/10/15   [name], MD  aspirin 81 MG tablet Take 81 mg by mouth daily.    Historical Provider, MD  cloNIDine (CATAPRES) 0.1 MG tablet Take 0.5 tablets (0.05 mg total) by mouth 2 (two) times daily. 11/02/14   [name], MD  ezetimibe-simvastatin (VYTORIN) 10-20 MG per tablet Take 1 tablet by mouth daily. 06/21/15   [name], MD  GLIPIZIDE PO Take by mouth.    Historical Provider, MD  lisinopril-hydrochlorothiazide (PRINZIDE) 20-12.5 MG per tablet Take 1 tablet by mouth daily. 01/12/14   [name], MD  Nebivolol HCl 20 MG TABS Take 1  tablet (20 mg total) by mouth daily. 06/21/15   [name], MD  SitaGLIPtin-MetFORMIN HCl (JANUMET PO) Take by mouth.    Historical Provider, MD   BP 61/40 mmHg  Temp(Src)   SpO2    Filed Vitals:   07/26/15 1930 07/26/15 2000 07/26/15 2015 07/26/15 2046  BP: 112/69 110/56 111/58 126/60  Pulse: 92 92 95 59  Temp:      TempSrc:      Resp: SpO2: 100% 100% 100%   ,  Physical Exam  Constitutional: She is oriented to person, place, and time. She appears  well-developed and well-nourished. No distress.  HENT:  Head: Normocephalic and atraumatic.  Nose: Nose normal.  Mouth/Throat: Oropharynx is clear and moist. No oropharyngeal exudate.  Eyes: EOM are normal. Pupils are equal, round, and reactive to light.  Neck: Normal range of motion. Neck supple.  Cardiovascular: Normal rate, regular rhythm, normal heart sounds and intact distal pulses.   No murmur heard. Pulmonary/Chest: Effort normal and breath sounds normal. No respiratory distress. She has no wheezes. She exhibits no tenderness.  Abdominal: Soft. There is no tenderness. There is no rebound and no guarding.  Musculoskeletal: Normal range of motion. She exhibits no tenderness.  Lymphadenopathy:    She has no cervical adenopathy.  Neurological: She is alert and oriented to person, place, and time. No cranial nerve deficit. Coordination normal.  Skin: Skin is warm and dry. She is not diaphoretic.  Psychiatric: She has a normal mood and affect. Her behavior is normal. Judgment and thought content normal.  Nursing note and vitals reviewed.   ED Course  Procedures (including critical care time) Labs Review Labs Reviewed  BASIC METABOLIC PANEL - Abnormal; Notable for the following:    Potassium 5.7 (*)    Chloride 99 (*)    CO2 14 (*)    Glucose, Bld 165 (*)    BUN 42 (*)    Creatinine, Ser 2.59 (*)    GFR calc non Af Amer 17 (*)    GFR calc Af Amer 20 (*)    Anion gap 28 (*)    All other components within normal limits  CBC - Abnormal; Notable for the following:    WBC 14.2 (*)    RBC 5.20 (*)    All other components within normal limits  URINALYSIS, ROUTINE W REFLEX MICROSCOPIC (NOT AT [facility]) - Abnormal; Notable for the following:    APPearance CLOUDY (*)    Hgb urine dipstick LARGE (*)    Protein, ur 100 (*)    Leukocytes, UA TRACE (*)    All other components within normal limits  HEPATIC FUNCTION PANEL - Abnormal; Notable for the following:    Total Protein 8.4 (*)     Albumin 3.4 (*)    AST 225 (*)    ALT <5 (*)    Total Bilirubin 2.2 (*)    Indirect Bilirubin 1.7 (*)    All other components within normal limits  LIPASE, BLOOD - Abnormal; Notable for the following:    Lipase 64 (*)    All other components within normal limits  URINE MICROSCOPIC-ADD ON - Abnormal; Notable for the following:    Squamous Epithelial / LPF FEW (*)    All other components within normal limits  CBG MONITORING, ED - Abnormal; Notable for the following:    Glucose-Capillary 135 (*)    All other components within normal limits  I-STAT CG4 LACTIC ACID, ED - Abnormal; Notable  for the following:    Lactic Acid, Venous 15.13 (*)    All other components within normal limits  URINE CULTURE  CULTURE, BLOOD (ROUTINE X 2)  CULTURE, BLOOD (ROUTINE X 2)  I-STAT TROPOININ, ED  I-STAT CG4 LACTIC ACID, ED  I-STAT CG4 LACTIC ACID, ED    Imaging Review Dg Chest Portable 1 View  07/26/2015   CLINICAL DATA:  Mallet pain for 1 week.  Hypotension today.  EXAM: PORTABLE CHEST - 1 VIEW  COMPARISON:  None.  FINDINGS: The lungs are clear. Heart size is normal. No pneumothorax or pleural effusion.  IMPRESSION: No acute disease.   Electronically Signed   By: Drusilla Kanner M.D.   On: 07/26/2015 19:15     EKG Interpretation   Date/Time:  Tuesday July 26 2015 18:39:14 EDT Ventricular Rate:  93 PR Interval:  172 QRS Duration: 68 QT Interval:  352 QTC Calculation: 437 R Axis:   125 Text Interpretation:  Normal sinus rhythm Right atrial enlargement Right  axis deviation Anterior infarct , age undetermined Abnormal ECG Sinus  rhythm Rightward axis QT prolonged Abnormal ekg Confirmed by Gerhard Munch  MD ([id number]) on 07/26/2015 8:00:26 PM      MDM   Final diagnoses:  AKI (acute kidney injury)  Hypotension, unspecified hypotension type   Pt is a 74 yo F with hx of DM, HTN, CVA, CKD who presents with 2 days of fatigue and generalized weakness.  Initially was hypotensive in triage with  60/palp, then up to 80/60 when brought back to the room.  Alert and oriented to person/place/situation but not time.  Denies current chest or abdominal pain.  No obvious wounds or rashes.  Lungs clear with normal work of breathing, good O2 sats on room air after repositioning it several times to get a good wave form.    Son presented to the ED and reported that he found her sitting on the ground, unable to get up from the floor on her own.  She reports that she felt weak and sat down on the ground but denies any fall.  Felt diffusely weak so couldn't get up on her own.  Denies hitting head, nontraumatic exam.    Due to hypotension in this elderly patient with several risk factors, will work up with blood and urine cultures and broad labs.   Given 2 L NS bolus.  Blood pressures significantly improved after initial IVF boluses.  Still mentating well.    EKG: NSR at 93 bpm, QTc 437.  No acute ischemic changes.  No peaked T waves.   Initial lactate 15.  Labs show significant AKI.  Baseline Cr < 2, today is 2.59 with BUN 42.   K 5.7 but no EKG changes.  Treated with IVF hydration, will continue to follow for now.    Given 3L NS boluses in the ED Repeat lactate trended down to 4  Spoke to Dr. Julian Reil with hospitalist team for admission    If performed, labs, EKGs, and imaging were reviewed and interpreted by myself and my attending, and incorporated in the medical decision making.  Patient was seen with ED Attending, Dr. Derrick Ravel, MD   Lenell Antu, MD 07/27/15 1610  Gerhard Munch, MD 07/29/15 1022

## 2015-07-26 NOTE — ED Notes (Addendum)
Dana Blair (son) [phone]  (CELL)

## 2015-07-27 ENCOUNTER — Inpatient Hospital Stay (HOSPITAL_COMMUNITY): Payer: Medicare Other

## 2015-07-27 ENCOUNTER — Encounter (HOSPITAL_COMMUNITY): Payer: Self-pay | Admitting: General Practice

## 2015-07-27 DIAGNOSIS — R55 Syncope and collapse: Secondary | ICD-10-CM | POA: Diagnosis present

## 2015-07-27 DIAGNOSIS — E872 Acidosis, unspecified: Secondary | ICD-10-CM | POA: Diagnosis present

## 2015-07-27 DIAGNOSIS — I5032 Chronic diastolic (congestive) heart failure: Secondary | ICD-10-CM | POA: Diagnosis present

## 2015-07-27 DIAGNOSIS — I951 Orthostatic hypotension: Secondary | ICD-10-CM | POA: Diagnosis present

## 2015-07-27 DIAGNOSIS — I517 Cardiomegaly: Secondary | ICD-10-CM | POA: Diagnosis present

## 2015-07-27 LAB — LIPID PANEL
Cholesterol: 99 mg/dL (ref 0–200)
Cholesterol: 119 mg/dL (ref 0–200)
HDL: 38 mg/dL — ABNORMAL LOW (ref >40)
HDL: 45 mg/dL (ref >40)
LDL Cholesterol: 48 mg/dL (ref 0–99)
LDL Cholesterol: 52 mg/dL (ref 0–99)
Total CHOL/HDL Ratio: 2.6 RATIO
Total CHOL/HDL Ratio: 2.6 RATIO
Triglycerides: 65 mg/dL (ref <150)
Triglycerides: 111 mg/dL (ref <150)
VLDL: 13 mg/dL (ref 0–40)
VLDL: 22 mg/dL (ref 0–40)

## 2015-07-27 LAB — GLUCOSE, CAPILLARY
GLUCOSE-CAPILLARY: 209 mg/dL — AB (ref 65–99)
Glucose-Capillary: 120 mg/dL — ABNORMAL HIGH (ref 65–99)

## 2015-07-27 LAB — COMPREHENSIVE METABOLIC PANEL
ALK PHOS: 63 U/L (ref 38–126)
ALT: 207 U/L — ABNORMAL HIGH (ref 14–54)
ANION GAP: 12 (ref 5–15)
AST: 315 U/L — ABNORMAL HIGH (ref 15–41)
Albumin: 2.8 g/dL — ABNORMAL LOW (ref 3.5–5.0)
BILIRUBIN TOTAL: 0.6 mg/dL (ref 0.3–1.2)
BUN: 41 mg/dL — ABNORMAL HIGH (ref 6–20)
CALCIUM: 8.3 mg/dL — AB (ref 8.9–10.3)
CHLORIDE: 113 mmol/L — AB (ref 101–111)
CO2: 20 mmol/L — ABNORMAL LOW (ref 22–32)
Creatinine, Ser: 1.47 mg/dL — ABNORMAL HIGH (ref 0.44–1.00)
GFR calc Af Amer: 39 mL/min — ABNORMAL LOW (ref >60)
GFR calc non Af Amer: 34 mL/min — ABNORMAL LOW (ref >60)
Glucose, Bld: 162 mg/dL — ABNORMAL HIGH (ref 65–99)
Potassium: 3.7 mmol/L (ref 3.5–5.1)
Sodium: 145 mmol/L (ref 135–145)
TOTAL PROTEIN: 6.7 g/dL (ref 6.5–8.1)

## 2015-07-27 LAB — CBC WITH DIFFERENTIAL/PLATELET
BASOS ABS: 0 10*3/uL (ref 0.0–0.1)
BASOS PCT: 0 % (ref 0–1)
EOS ABS: 0 10*3/uL (ref 0.0–0.7)
Eosinophils Relative: 0 % (ref 0–5)
HCT: 37.8 % (ref 36.0–46.0)
Hemoglobin: 12.6 g/dL (ref 12.0–15.0)
Lymphocytes Relative: 10 % — ABNORMAL LOW (ref 12–46)
Lymphs Abs: 2.1 10*3/uL (ref 0.7–4.0)
MCH: 27.5 pg (ref 26.0–34.0)
MCHC: 33.3 g/dL (ref 30.0–36.0)
MCV: 82.4 fL (ref 78.0–100.0)
MONOS PCT: 6 % (ref 3–12)
Monocytes Absolute: 1.2 10*3/uL — ABNORMAL HIGH (ref 0.1–1.0)
NEUTROS PCT: 84 % — AB (ref 43–77)
Neutro Abs: 17.7 10*3/uL — ABNORMAL HIGH (ref 1.7–7.7)
Platelets: 317 10*3/uL (ref 150–400)
RBC: 4.59 MIL/uL (ref 3.87–5.11)
RDW: 13.7 % (ref 11.5–15.5)
WBC: 21 10*3/uL — AB (ref 4.0–10.5)

## 2015-07-27 LAB — CBC
HEMATOCRIT: 54.1 % — AB (ref 36.0–46.0)
Hemoglobin: 17.3 g/dL — ABNORMAL HIGH (ref 12.0–15.0)
MCH: 26.6 pg (ref 26.0–34.0)
MCHC: 32 g/dL (ref 30.0–36.0)
MCV: 83.1 fL (ref 78.0–100.0)
Platelets: 159 10*3/uL (ref 150–400)
RBC: 6.51 MIL/uL — ABNORMAL HIGH (ref 3.87–5.11)
RDW: 13.6 % (ref 11.5–15.5)
WBC: 18.7 10*3/uL — ABNORMAL HIGH (ref 4.0–10.5)

## 2015-07-27 LAB — TROPONIN I
TROPONIN I: 0.24 ng/mL — AB (ref <0.031)
TROPONIN I: 0.2 ng/mL — AB (ref <0.031)
Troponin I: 0.23 ng/mL — ABNORMAL HIGH (ref <0.031)

## 2015-07-27 LAB — HEPATIC FUNCTION PANEL
ALT: 194 U/L — ABNORMAL HIGH (ref 14–54)
AST: 225 U/L — AB (ref 15–41)
Albumin: 3.4 g/dL — ABNORMAL LOW (ref 3.5–5.0)
Alkaline Phosphatase: 82 U/L (ref 38–126)
BILIRUBIN INDIRECT: 1.7 mg/dL — AB (ref 0.3–0.9)
Bilirubin, Direct: 0.5 mg/dL (ref 0.1–0.5)
Total Bilirubin: 2.2 mg/dL — ABNORMAL HIGH (ref 0.3–1.2)
Total Protein: 8.4 g/dL — ABNORMAL HIGH (ref 6.5–8.1)

## 2015-07-27 LAB — HEPARIN LEVEL (UNFRACTIONATED): Heparin Unfractionated: 0.53 IU/mL (ref 0.30–0.70)

## 2015-07-27 LAB — I-STAT CG4 LACTIC ACID, ED: Lactic Acid, Venous: 1.04 mmol/L (ref 0.5–2.0)

## 2015-07-27 LAB — CBG MONITORING, ED: GLUCOSE-CAPILLARY: 105 mg/dL — AB (ref 65–99)

## 2015-07-27 LAB — LACTIC ACID, PLASMA: LACTIC ACID, VENOUS: 1 mmol/L (ref 0.5–2.0)

## 2015-07-27 LAB — CK: CK TOTAL: 157 U/L (ref 38–234)

## 2015-07-27 MED ORDER — PIPERACILLIN-TAZOBACTAM 3.375 G IVPB 30 MIN
3.3750 g | Freq: Once | INTRAVENOUS | Status: AC
  Administered 2015-07-27: 3.375 g via INTRAVENOUS
  Filled 2015-07-27: qty 50

## 2015-07-27 MED ORDER — VANCOMYCIN HCL IN DEXTROSE 750-5 MG/150ML-% IV SOLN
750.0000 mg | INTRAVENOUS | Status: DC
  Filled 2015-07-27: qty 150

## 2015-07-27 MED ORDER — PIPERACILLIN-TAZOBACTAM 3.375 G IVPB
3.3750 g | Freq: Three times a day (TID) | INTRAVENOUS | Status: DC
  Administered 2015-07-27 – 2015-07-28 (×3): 3.375 g via INTRAVENOUS
  Filled 2015-07-27 (×4): qty 50

## 2015-07-27 MED ORDER — INSULIN ASPART 100 UNIT/ML ~~LOC~~ SOLN
0.0000 [IU] | SUBCUTANEOUS | Status: DC
  Administered 2015-07-27: 3 [IU] via SUBCUTANEOUS
  Administered 2015-07-28 (×2): 1 [IU] via SUBCUTANEOUS
  Administered 2015-07-28: 2 [IU] via SUBCUTANEOUS
  Administered 2015-07-28: 1 [IU] via SUBCUTANEOUS
  Administered 2015-07-28: 2 [IU] via SUBCUTANEOUS
  Administered 2015-07-28: 1 [IU] via SUBCUTANEOUS
  Administered 2015-07-29: 9 [IU] via SUBCUTANEOUS
  Administered 2015-07-29: 5 [IU] via SUBCUTANEOUS
  Administered 2015-07-29: 1 [IU] via SUBCUTANEOUS
  Administered 2015-07-29: 2 [IU] via SUBCUTANEOUS
  Administered 2015-07-29: 3 [IU] via SUBCUTANEOUS
  Administered 2015-07-30 (×2): 1 [IU] via SUBCUTANEOUS
  Administered 2015-07-30: 2 [IU] via SUBCUTANEOUS
  Administered 2015-07-30: 7 [IU] via SUBCUTANEOUS
  Administered 2015-07-30 – 2015-07-31 (×2): 3 [IU] via SUBCUTANEOUS
  Administered 2015-07-31: 2 [IU] via SUBCUTANEOUS
  Administered 2015-07-31: 3 [IU] via SUBCUTANEOUS
  Administered 2015-07-31: 2 [IU] via SUBCUTANEOUS
  Administered 2015-07-31: 5 [IU] via SUBCUTANEOUS
  Administered 2015-08-01: 2 [IU] via SUBCUTANEOUS
  Administered 2015-08-01: 3 [IU] via SUBCUTANEOUS
  Administered 2015-08-01 – 2015-08-02 (×6): 2 [IU] via SUBCUTANEOUS
  Administered 2015-08-02: 3 [IU] via SUBCUTANEOUS
  Administered 2015-08-02 (×2): 2 [IU] via SUBCUTANEOUS

## 2015-07-27 MED ORDER — HEPARIN (PORCINE) IN NACL 100-0.45 UNIT/ML-% IJ SOLN
650.0000 [IU]/h | INTRAMUSCULAR | Status: DC
  Administered 2015-07-27: 650 [IU]/h via INTRAVENOUS
  Filled 2015-07-27: qty 250

## 2015-07-27 MED ORDER — HEPARIN BOLUS VIA INFUSION
3000.0000 [IU] | Freq: Once | INTRAVENOUS | Status: AC
  Administered 2015-07-27: 3000 [IU] via INTRAVENOUS
  Filled 2015-07-27: qty 3000

## 2015-07-27 MED ORDER — VANCOMYCIN HCL IN DEXTROSE 1-5 GM/200ML-% IV SOLN: 1000.0000 mg | Freq: Once | INTRAVENOUS | Status: DC

## 2015-07-27 NOTE — Progress Notes (Signed)
Paged on-call with 3rd + Troponin of 0.20. Also, family fixated on scant amount of urine and often needing of bedpan. Urine is dark and no odor

## 2015-07-27 NOTE — ED Notes (Signed)
Pt requesting food; RN explained to pt that she is NPO

## 2015-07-27 NOTE — Progress Notes (Signed)
  Echocardiogram 2D Echocardiogram has been performed.  Arvil Chaco 07/27/2015, 4:45 PM

## 2015-07-27 NOTE — ED Notes (Signed)
Paged Dr Joseph Art to report troponin of 0.23.

## 2015-07-27 NOTE — ED Notes (Signed)
Dr Joseph Art aware of pt's critical troponin 0.23

## 2015-07-27 NOTE — Progress Notes (Signed)
ANTIBIOTIC CONSULT NOTE - INITIAL  Pharmacy Consult:  Vancomycin / Zosyn Indication:  Sepsis  No Known Allergies  Patient Measurements: Height:  (162.6 cm) Weight: 120 lb (54.432 kg) IBW/kg (Calculated) : 54.7  Vital Signs: BP: 108/56 mmHg (07/27 0800) Pulse Rate: 62 (07/27 0800) Intake/Output from previous day: 07/26 0701 - 07/27 0700 In: 4500 [I.V.:1500; IV Piggyback:3000] Out: 900 [Urine:900]  Labs:  Recent Labs  07/26/15 1858 07/27/15 0320  WBC 14.2* 18.7*  HGB 14.6 17.3*  PLT 333 159  CREATININE 2.59* 1.47*   Estimated Creatinine Clearance: 28.8 mL/min (by C-G formula based on Cr of 1.47). No results for input(s): VANCOTROUGH, VANCOPEAK, VANCORANDOM, GENTTROUGH, GENTPEAK, GENTRANDOM, TOBRATROUGH, TOBRAPEAK, TOBRARND, AMIKACINPEAK, AMIKACINTROU, AMIKACIN in the last 72 hours.   Microbiology: No results found for this or any previous visit (from the past 720 hour(s)).  Medical History: Past Medical History  Diagnosis Date  . Diabetes     Diabetes mellitus with chronic kidney disease   . Hypertension     [facility]- [facility] 06/29/09 - LVH, normal EF, renal ultrasound-no renal artery stenosis  . Hyperlipidemia   . 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 She had concomitant respiratory failure. On May 22, 2008, CT of the head without contrast demonstrated a 9 x 20 mm acute hematoma within the head of the right caudate with intraventricular extension of hemorrhage. Neurosurgery consult-no ventriculostomy.  . Glaucoma   . Osteopenia   . Chronic kidney disease       Assessment: 20 YOF brought to the ED by her son after finding her down at home.  Pharmacy consulted to initiate vancomycin and Zosyn for sepsis.  Patient's AKI is resolving.   Goal of Therapy:  Vancomycin trough level 15-20 mcg/ml   Plan:  - Vanc 1gm IV x 1, then  IV Q24H - Zosyn 3.375gm IV Q8H, 4 hr infusion - Monitor renal fxn, clinical progress, vanc trough as  indicated   Icel Castles D. Laney Potash, PharmD, BCPS Pager:  [phone] 07/27/2015, 9:12 AM

## 2015-07-27 NOTE — Progress Notes (Signed)
[facility] TEAM 1 - Stepdown/ICU TEAM Progress Note  Dana Blair:[id number] DOB: 1941/10/23 DOA: 07/26/2015 PCP: Dana Rubenstein, MD  Admit HPI / Brief Narrative: 74 y.o. BF PMHx diabetes type 2, HTN, HLD, CVA/Iintracranial Hemorrhage, DM Type 2, CKD   Brought in by son after being found down at home. Had generalized weakness onset 2 days ago. At baseline is ambulatory and lives alone. Patients son found mother on floor at 6pm tonight, unclear how long she was down for. Patient states she was dizzy before she fell, denies hitting head, denies LOC.  HPI/Subjective: 7/27 A/O 4, NAD. States she was inside the house stood up and remembers becoming dizzy prior to positive syncope. States was also aware of her previous CVAs and that there were no neuro deficits.  Assessment/Plan: Sepsis unspecified organism -Patient meets criteria for sepsis unknown source -Blood cultures, urine cultures pending -PCXR nondiagnostic -Start patient on sepsis protocol antibiotics; will stop antibiotics in a.m. if no organism found. Most likely stress  Demargination.  Syncope with collapse -Cause unclear; CVA ? vs arrhythmia/MI? vs hypoglycemia? vs vasovagal? vs orthostatic hypotension? -Orthostatic; patient positive orthostatic vitals; still hypotensive for her age. -Patient with history of previous CVA/intracranial hemorrhage Head CT nondiagnostic -MRI/MRA brain; shows multiple chronic strokes, negative acute stroke  -Cardiac echo ; diastolic CHF -cycle troponins; troponins trending up most likely secondary to demand ischemia; patient currently asymptomatic  MI vs Demand Ischemia -Elevated troponin most likely secondary to demand ischemia, echocardiogram shows LVH/diastolic CHF (mild)  -Although patient has a positive occult blood stool, H/H has remained stable. GI bleed is questionable will start patient on heparin drip per ACS protocol, and monitor closely for any additional bleeding  Diastolic  CHF/LVH -Currently patient's BP will not tolerate CHF medication -Patient currently hypotensive for a patient of her age. Increase normal  saline 12ml/hr -Patient on multiple blood pressure medications at home amlodipine, clonidine, lisinopril-Hydrocort thiazide, Nebivolol. All these medications will be placed on hold, consider not restarting prior to discharge. -Symptomatic orthostatic hypotension  Essential HTN/hypotension -See diastolic CHF  Orthostatic hypotension -Most likely iatrogenic secondary to multiple BP medication -See diastolic CHF  Diabetes type 2  -Hemoglobin A1c pending -Lipid panel pending -Continue sensitive SSI  Acute kidney failure ?(Last Cr visible is 05/2008 Cr 0.77)/Dehydration -Monitor closely for improvement with hydration. -Hold all nephrotoxic medication  Lactic Acidosis  -Resolved   GI bleed? -Fecal occult positive however patient H&H stable, monitor closely    Code Status: FULL Family Communication: no family present at time of exam Disposition Plan: Resolution orthostatic hypotension    Consultants: NA  Procedure/Significant Events: 7/27 CT head without contrast;No acute intracranial pathology. Chronic changes noted. 7/27 MR I/MRA brain without contrast;- Moderate chronic small vessel ischemic disease. 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 -Chronic lacunar infarcts in the thalami. - Moderate to severe right and mild left proximal ACA stenoses. 7/27 echocardiogram;- Left ventricle: moderate LVH. -LVEF= 65%- 70%. - (grade 1 diastolicdysfunction).  -Pulmonary arteries: PA peak pressure: 17 mm Hg (S).   Culture 7/26 urine pending 7/26 blood 2 pending  Antibiotics: Zosyn 7/27>> Vancomycin 7/27>>  DVT prophylaxis: SCD   Devices    LINES / TUBES:      Continuous Infusions: . sodium chloride 1,000 mL (07/27/15 1758)  . heparin 650 Units/hr (07/27/15 1302)    Objective: VITAL SIGNS: Temp: 98.4 F  (36.9 C) (07/27 1645) Temp Source: Oral (07/27 1645) BP: 177/84 mmHg (07/27 1645) Pulse Rate: 66 (07/27 1122) SPO2; FIO2:  Intake/Output Summary (Last 24 hours) at 07/27/15 1924 Last data filed at 07/27/15 1230  Gross per 24 hour  Intake   4500 ml  Output   1200 ml  Net   3300 ml     Exam: General: A/O 4, cachectic, NAD, No acute respiratory distress Eyes: Negative headache, eye pain, double vision, negative scleral hemorrhage ENT: Negative Runny nose, negative ear pain, negative tinnitus, negative gingival bleeding Neck:  Negative scars, masses, torticollis, lymphadenopathy, JVD Lungs: Clear to auscultation bilaterally without wheezes or crackles Cardiovascular: Regular rate and rhythm without murmur gallop or rub normal S1 and S2 Abdomen:negative abdominal pain, negative dysphagia, Nontender, nondistended, soft, bowel sounds positive, no rebound, no ascites, no appreciable mass Extremities: No significant cyanosis, clubbing, or edema bilateral lower extremities Psychiatric:  Negative depression, negative anxiety, negative fatigue, negative mania  Neurologic:  Cranial nerves II through XII intact, tongue/uvula midline, all extremities muscle strength 5/5, sensation intact throughout,  negative dysarthria, negative expressive aphasia, negative receptive aphasia.      Data Reviewed: Basic Metabolic Panel:  Recent Labs Lab 07/26/15 1858 07/27/15 0320  NA 141 145  K 5.7* 3.7  CL 99* 113*  CO2 14* 20*  GLUCOSE 165* 162*  BUN 42* 41*  CREATININE 2.59* 1.47*  CALCIUM 9.7 8.3*   Liver Function Tests:  Recent Labs Lab 07/26/15 1858 07/27/15 0320  AST 225* 315*  ALT 194* 207*  ALKPHOS 82 63  BILITOT 2.2* 0.6  PROT 8.4* 6.7  ALBUMIN 3.4* 2.8*    Recent Labs Lab 07/26/15 1858  LIPASE 64*   No results for input(s): AMMONIA in the last 168 hours. CBC:  Recent Labs Lab 07/26/15 1858 07/27/15 0320 07/27/15 1336  WBC 14.2* 18.7* 21.0*  NEUTROABS  --   --   17.7*  HGB 14.6 17.3* 12.6  HCT 44.9 54.1* 37.8  MCV 86.3 83.1 82.4  PLT 333 159 317   Cardiac Enzymes:  Recent Labs Lab 07/26/15 1858 07/27/15 0859 07/27/15 1337  CKTOTAL 157  --   --   TROPONINI  --  0.23* 0.24*   BNP (last 3 results) No results for input(s): BNP in the last 8760 hours.  ProBNP (last 3 results) No results for input(s): PROBNP in the last 8760 hours.  CBG:  Recent Labs Lab 07/26/15 1837 07/27/15 1127 07/27/15 1630  GLUCAP 135* 105* 120*    Recent Results (from the past 240 hour(s))  Blood culture (routine x 2)     Status: None (Preliminary result)   Collection Time: 07/26/15  6:58 PM  Result Value Ref Range Status   Specimen Description BLOOD LEFT ANTECUBITAL  Final   Special Requests BOTTLES DRAWN AEROBIC AND ANAEROBIC 2CC  Final   Culture NO GROWTH < 24 HOURS  Final   Report Status PENDING  Incomplete  Blood culture (routine x 2)     Status: None (Preliminary result)   Collection Time: 07/26/15  7:43 PM  Result Value Ref Range Status   Specimen Description BLOOD LEFT HAND  Final   Special Requests BOTTLES DRAWN AEROBIC ONLY 3CC  Final   Culture NO GROWTH < 24 HOURS  Final   Report Status PENDING  Incomplete  Urine culture     Status: None (Preliminary result)   Collection Time: 07/26/15  8:23 PM  Result Value Ref Range Status   Specimen Description URINE, CATHETERIZED  Final   Special Requests NONE  Final   Culture CULTURE REINCUBATED FOR BETTER GROWTH  Final   Report Status PENDING  Incomplete     Studies:  Recent x-ray studies have been reviewed in detail by the Attending Physician  Scheduled Meds:  Scheduled Meds: . aspirin EC  81 mg Oral Daily  . insulin aspart  0-9 Units Subcutaneous 6 times per day  . piperacillin-tazobactam (ZOSYN)  IV  3.375 g Intravenous Q8H  . vancomycin  1,000 mg Intravenous Once  . [START ON 07/28/2015] vancomycin  750 mg Intravenous Q24H    Time spent on care of this patient: 40 mins   Dadrian Ballantine,  Roselind Messier , MD  [facility] Office  [phone] Pager - [phone]  On-Call/Text Page:      [email or web address]      password [email or web address]  If 7PM-7AM, please contact night-coverage [email or web address] Password [email or web address] 07/27/2015, 7:24 PM   LOS: 1 day   Care during the described time interval was provided by me .  I have reviewed this patient's available data, including medical history, events of note, physical examination, and all test results as part of my evaluation. I have personally reviewed and interpreted all radiology studies.   Carolyne Littles, MD [phone] Pager

## 2015-07-27 NOTE — Progress Notes (Addendum)
ANTICOAGULATION CONSULT NOTE - Initial Consult  Pharmacy Consult for heparin Indication: ACS  No Known Allergies  Patient Measurements: Height:  (162.6 cm) Weight: 120 lb (54.432 kg) IBW/kg (Calculated) : 54.7   Vital Signs: Temp: 98.6 F (37 C) (07/27 1205) Temp Source: Oral (07/27 1205) BP: 146/77 mmHg (07/27 1205) Pulse Rate: 66 (07/27 1122)  Labs:  Recent Labs  07/26/15 1858 07/27/15 0320 07/27/15 0859  HGB 14.6 17.3*  --   HCT 44.9 54.1*  --   PLT 333 159  --   CREATININE 2.59* 1.47*  --   CKTOTAL 157  --   --   TROPONINI  --   --  0.23*    Estimated Creatinine Clearance: 28.8 mL/min (by C-G formula based on Cr of 1.47).   Medical History: Past Medical History  Diagnosis Date  . Diabetes     Diabetes mellitus with chronic kidney disease   . Hypertension     [facility]- ECHO 06/29/09 - LVH, normal EF, renal ultrasound-no renal artery stenosis  . Hyperlipidemia   . 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 She had concomitant respiratory failure. On May 22, 2008, CT of the head without contrast demonstrated a 9 x 20 mm acute hematoma within the head of the right caudate with intraventricular extension of hemorrhage. Neurosurgery consult-no ventriculostomy.  . Glaucoma   . Osteopenia   . Chronic kidney disease     Medications:  Scheduled:  . aspirin EC  81 mg Oral Daily  . insulin aspart  0-9 Units Subcutaneous 6 times per day  . piperacillin-tazobactam (ZOSYN)  IV  3.375 g Intravenous Q8H  . vancomycin  1,000 mg Intravenous Once  . [START ON 07/28/2015] vancomycin  750 mg Intravenous Q24H    Assessment: 74 YOF brought to the ED by her son after finding her down at home.  Pharmacy consulted to initiate heparin for ACS. Troponin 0.23. H/H elevated 17.3/54.1. Plts wnl, trend down 333 > 159 prior to heparin initiation. Patient's AKI is resolving. SCr trend down 2.56 > 1.47.   Goal of Therapy:  Heparin level 0.3-0.7 units/ml Monitor  platelets by anticoagulation protocol: Yes   Plan:  - Heparin bolus 3000 units, then heparin gtt 650 units/hr - 1900 HL - Monitor CBC, s/sx bleeding, renal function  Greggory Stallion, PharmD Clinical Pharmacist Pager # [phone] 07/27/2015 12:37 PM   Addendum: Heparin level 0.53 which is therapeutic.   Will continue heparin at the same rate and recheck labs in the morning.  Celedonio Miyamoto, PharmD, BCPS-AQ ID Clinical Pharmacist Pager [phone]

## 2015-07-28 ENCOUNTER — Inpatient Hospital Stay (HOSPITAL_COMMUNITY): Payer: Medicare Other

## 2015-07-28 DIAGNOSIS — E43 Unspecified severe protein-calorie malnutrition: Secondary | ICD-10-CM

## 2015-07-28 LAB — CBC
HCT: 42.7 % (ref 36.0–46.0)
HEMOGLOBIN: 14.9 g/dL (ref 12.0–15.0)
MCH: 27.8 pg (ref 26.0–34.0)
MCHC: 34.9 g/dL (ref 30.0–36.0)
MCV: 79.7 fL (ref 78.0–100.0)
Platelets: DECREASED 10*3/uL (ref 150–400)
RBC: 5.36 MIL/uL — AB (ref 3.87–5.11)
RDW: 13.6 % (ref 11.5–15.5)
WBC: 21.6 10*3/uL — AB (ref 4.0–10.5)

## 2015-07-28 LAB — GLUCOSE, CAPILLARY
GLUCOSE-CAPILLARY: 145 mg/dL — AB (ref 65–99)
Glucose-Capillary: 139 mg/dL — ABNORMAL HIGH (ref 65–99)
Glucose-Capillary: 153 mg/dL — ABNORMAL HIGH (ref 65–99)
Glucose-Capillary: 136 mg/dL — ABNORMAL HIGH (ref 65–99)
Glucose-Capillary: 161 mg/dL — ABNORMAL HIGH (ref 65–99)
Glucose-Capillary: 138 mg/dL — ABNORMAL HIGH (ref 65–99)

## 2015-07-28 LAB — COMPREHENSIVE METABOLIC PANEL
ALT: 152 U/L — AB (ref 14–54)
ANION GAP: 16 — AB (ref 5–15)
AST: 100 U/L — AB (ref 15–41)
Albumin: 2.9 g/dL — ABNORMAL LOW (ref 3.5–5.0)
Alkaline Phosphatase: 74 U/L (ref 38–126)
BILIRUBIN TOTAL: 1.2 mg/dL (ref 0.3–1.2)
BUN: 22 mg/dL — AB (ref 6–20)
CHLORIDE: 109 mmol/L (ref 101–111)
CO2: 20 mmol/L — ABNORMAL LOW (ref 22–32)
CREATININE: 1.35 mg/dL — AB (ref 0.44–1.00)
Calcium: 8.8 mg/dL — ABNORMAL LOW (ref 8.9–10.3)
GFR, EST AFRICAN AMERICAN: 44 mL/min — AB (ref >60)
GFR, EST NON AFRICAN AMERICAN: 38 mL/min — AB (ref >60)
Glucose, Bld: 174 mg/dL — ABNORMAL HIGH (ref 65–99)
Potassium: 3.1 mmol/L — ABNORMAL LOW (ref 3.5–5.1)
SODIUM: 145 mmol/L (ref 135–145)
Total Protein: 7.5 g/dL (ref 6.5–8.1)

## 2015-07-28 LAB — MAGNESIUM: Magnesium: 1.7 mg/dL (ref 1.7–2.4)

## 2015-07-28 LAB — HEPARIN LEVEL (UNFRACTIONATED): Heparin Unfractionated: 0.31 IU/mL (ref 0.30–0.70)

## 2015-07-28 LAB — CLOSTRIDIUM DIFFICILE BY PCR: Toxigenic C. Difficile by PCR: NEGATIVE

## 2015-07-28 LAB — HEMOGLOBIN A1C
HEMOGLOBIN A1C: 6.2 % — AB (ref 4.8–5.6)
MEAN PLASMA GLUCOSE: 131 mg/dL

## 2015-07-28 MED ORDER — IOHEXOL 300 MG/ML  SOLN
25.0000 mL | INTRAMUSCULAR | Status: AC
  Administered 2015-07-28 (×2): 25 mL via ORAL

## 2015-07-28 MED ORDER — ENSURE ENLIVE PO LIQD
237.0000 mL | Freq: Three times a day (TID) | ORAL | Status: DC
  Administered 2015-07-29 – 2015-07-31 (×6): 237 mL via ORAL

## 2015-07-28 NOTE — Care Management Note (Signed)
Case Management Note  Patient Details  Name: Dana Blair MRN: [id number] Date of Birth: 1941-01-21  Subjective/Objective:   Pt admitted for Fall and weakness. Pt is from home with family support of son. However, son works during the day long hours. Pt has no DME @ home. Per daughter she had been pretty independent.                  Action/Plan: CM did speak with daughter in regards to disposition needs. Per daughter she would like to keep the pt in the home as long as possible since it is familiar to pt. CM will provide to daughter list of Personal Care Services that family will have to pay out of pocket. CM will also provide information for life alert button. PT /OT to consult for additional recommendations. CM will place a [facility] consult for pt as well to see if they can f/u in the community. CM will continue to monitor for additional disposition needs. Expected Discharge Date:                  Expected Discharge Plan:  Home w Home Health Services  In-House Referral:     Discharge planning Services  CM Consult  Post Acute Care Choice:    Choice offered to:     DME Arranged:    DME Agency:     HH Arranged:    HH Agency:     Status of Service:  In process, will continue to follow  Medicare Important Message Given:    Date Medicare IM Given:    Medicare IM give by:    Date Additional Medicare IM Given:    Additional Medicare Important Message give by:     If discussed at Long Length of Stay Meetings, dates discussed:    Additional Comments:  Gala Lewandowsky, RN 07/28/2015, 11:13 AM

## 2015-07-28 NOTE — Progress Notes (Addendum)
[facility] PROGRESS NOTE   Assessment/Plan: AKI (acute kidney injury): - In the setting of lisinopril, hydrochlorothiazide and diarrhea. - Likely prerenal, she was started on aggressive fluid hydration she seems to be euvolemic. Her creatinine has improved significantly. - Will continue IV fluids and check a basic metabolic panel in the morning.  Leukocytosis: - Started on empiric antibiotic vancomycin and Zosyn, her lactic acidosis did improve with fluid resuscitation. - She is in acute renal failure with a mild leukocytosis and she relates she's been having watery diarrhea 3 stools per day for the last 4 days. - I will hold antibiotics, will check a C. Difficile, check a CT of the abdomen and pelvis without contrast as mild tenderness on physical exam.. - Chest x-ray shows no acute cardio pulmonary disease. She is awake alert and oriented 3 no nuchal rigidity. UA shows 0-1 blood cells with negative leukocytes esterase.  Severe protein-calorie malnutrition - Get a nutrition consult.  Chronic diastolic congestive heart failure - Seems to be euvolemic. Continue nebivolol hold ACE inhibitor and hydrochlorothiazide.  Orthostatic hypotension: - Improving with IV fluids.  Lactic acidosis Likely due to hypotensive episode due to antihypertensive medications and possibly diarrhea. Resolved with IV hydration.  Essential hypertension C chronic diastolic heart failure further details.  DM2 (diabetes mellitus, type 2) A1c was 6.2 we'll continue sliding scale insulin.   Elevated troponins: Likely due to demand ischemia due to hypotensive episode. We'll stop heparin. EKG shows sinus rhythm with right axis deviation poor preparation but no ST segment changes. Code Status: full Family Communication: Daughter  Disposition Plan: home in 2-3 day   Consultants:  none  Procedures:  CT abd and pelvis  Antibiotics:   single dose on 07/28/2015 of vancomycin and  Zosyn  HPI/Subjective: She relates she feels much better than yesterday.  Objective: Filed Vitals:   07/27/15 2008 07/28/15 0014 07/28/15 0455 07/28/15 0740  BP: 160/71 137/64 160/74 171/76  Pulse:  71 78 73  Temp: 99.3 F (37.4 C) 98.7 F (37.1 C) 99.1 F (37.3 C) 99.2 F (37.3 C)  TempSrc: Oral Oral Oral Oral  Resp:    18  Height:      Weight:   54.885 kg (121 lb)   SpO2: 100% 100% 100% 100%    Intake/Output Summary (Last 24 hours) at 07/28/15 0929 Last data filed at 07/27/15 1230  Gross per 24 hour  Intake      0 ml  Output    300 ml  Net   -300 ml   Filed Weights   07/27/15 0900 07/27/15 1152 07/28/15 0455  Weight: 54.432 kg (120 lb) 54.432 kg (120 lb) 54.885 kg (121 lb)    Exam:  General: Alert, awake, oriented x3, in no acute distress.  HEENT: No bruits, no goiter.  Heart: Regular rate and rhythm. Lungs: Good air movement, clear. Abdomen: Soft, mild tender, nondistended, positive bowel sounds.  Neuro: Grossly intact, nonfocal. No nuchal rigidity   Data Reviewed: Basic Metabolic Panel:  Recent Labs Lab 07/26/15 1858 07/27/15 0320  NA 141 145  K 5.7* 3.7  CL 99* 113*  CO2 14* 20*  GLUCOSE 165* 162*  BUN 42* 41*  CREATININE 2.59* 1.47*  CALCIUM 9.7 8.3*   Liver Function Tests:  Recent Labs Lab 07/26/15 1858 07/27/15 0320  AST 225* 315*  ALT 194* 207*  ALKPHOS 82 63  BILITOT 2.2* 0.6  PROT 8.4* 6.7  ALBUMIN 3.4* 2.8*    Recent Labs Lab 07/26/15 1858  LIPASE  64*   No results for input(s): AMMONIA in the last 168 hours. CBC:  Recent Labs Lab 07/26/15 1858 07/27/15 0320 07/27/15 1336 07/28/15 0430  WBC 14.2* 18.7* 21.0* 21.6*  NEUTROABS  --   --  17.7*  --   HGB 14.6 17.3* 12.6 14.9  HCT 44.9 54.1* 37.8 42.7  MCV 86.3 83.1 82.4 79.7  PLT 333 159 317 PLATELET CLUMPS NOTED ON SMEAR, COUNT APPEARS DECREASED   Cardiac Enzymes:  Recent Labs Lab 07/26/15 1858 07/27/15 0859 07/27/15 1337 07/27/15 2130  CKTOTAL 157  --   --    --   TROPONINI  --  0.23* 0.24* 0.20*   BNP (last 3 results) No results for input(s): BNP in the last 8760 hours.  ProBNP (last 3 results) No results for input(s): PROBNP in the last 8760 hours.  CBG:  Recent Labs Lab 07/27/15 1630 07/27/15 2030 07/28/15 0017 07/28/15 0429 07/28/15 0737  GLUCAP 120* 209* 139* 153* 136*    Recent Results (from the past 240 hour(s))  Blood culture (routine x 2)     Status: None (Preliminary result)   Collection Time: 07/26/15  6:58 PM  Result Value Ref Range Status   Specimen Description BLOOD LEFT ANTECUBITAL  Final   Special Requests BOTTLES DRAWN AEROBIC AND ANAEROBIC 2CC  Final   Culture NO GROWTH < 24 HOURS  Final   Report Status PENDING  Incomplete  Blood culture (routine x 2)     Status: None (Preliminary result)   Collection Time: 07/26/15  7:43 PM  Result Value Ref Range Status   Specimen Description BLOOD LEFT HAND  Final   Special Requests BOTTLES DRAWN AEROBIC ONLY 3CC  Final   Culture NO GROWTH < 24 HOURS  Final   Report Status PENDING  Incomplete  Urine culture     Status: None (Preliminary result)   Collection Time: 07/26/15  8:23 PM  Result Value Ref Range Status   Specimen Description URINE, CATHETERIZED  Final   Special Requests NONE  Final   Culture CULTURE REINCUBATED FOR BETTER GROWTH  Final   Report Status PENDING  Incomplete     Studies: Ct Head Wo Contrast  07/27/2015   CLINICAL DATA:  Weakness and dizziness.  Hypotension.  EXAM: CT HEAD WITHOUT CONTRAST  TECHNIQUE: Contiguous axial images were obtained from the base of the skull through the vertex without intravenous contrast.  COMPARISON:  None.  FINDINGS: Chronic ischemic changes in the periventricular white matter. Mild global atrophy appropriate to age. No mass effect, midline shift, or acute intracranial hemorrhage. Mastoid air cells are clear. Minimal bubbly mucous material in the left sphenoid sinus. Visualized paranasal sinuses are otherwise clear.  Cranium is intact.  IMPRESSION: No acute intracranial pathology.  Chronic changes are noted.   Electronically Signed   By: Jolaine Click M.D.   On: 07/27/2015 11:32   Mr Maxine Glenn Head Wo Contrast  07/27/2015   CLINICAL DATA:  Syncope. Found down. Increased weakness the last 2 days. Prior stroke.  EXAM: MRI HEAD WITHOUT CONTRAST  MRA HEAD WITHOUT CONTRAST  TECHNIQUE: Multiplanar, multiecho pulse sequences of the brain and surrounding structures were obtained without intravenous contrast. Angiographic images of the head were obtained using MRA technique without contrast.  COMPARISON:  Head CT 07/27/2015  FINDINGS: MRI HEAD FINDINGS  There is no evidence of acute infarct, mass, midline shift, or extra-axial fluid collection. There is mild generalized cerebral atrophy. Patchy and confluent T2 hyperintensities involving the deep greater than subcortical cerebral white  matter bilaterally and pons are nonspecific but compatible with moderate chronic small vessel ischemic disease. There may be 1 or 2 tiny chronic infarcts in the cerebellum.  There is a chronic hemorrhagic infarct involving the body of the right caudate nucleus and internal capsule. Numerous additional T2 hyperintense foci throughout the basal ganglia likely represent dilated perivascular spaces. Chronic lacunar infarcts and microhemorrhages are noted in the thalami.  Prior bilateral cataract extraction is noted. Paranasal sinuses and mastoid air cells are clear. Major intracranial vascular flow voids are preserved.  MRA HEAD FINDINGS  The visualized distal vertebral arteries are patent with the right being dominant. PICA origins are patent. Right AICA origin is patent. SCA origins are patent. Basilar artery is patent without stenosis. There is a patent left posterior communicating artery. PCAs are patent with mild narrowing of the left P1 segment, although this may be at least partly developmental given the presence of a medium-sized ipsilateral posterior  communicating artery. There is mild PCA branch vessel irregularity bilaterally.  Internal carotid arteries are patent from skullbase to carotid termini without stenosis. Anterior communicating artery is patent. Proximal A1 stenoses are moderate to severe on the right and mild on the left. MCAs are patent with mild-to-moderate branch vessel irregularity but no evidence of significant proximal stenosis. No intracranial aneurysm is identified.  IMPRESSION: 1. No acute intracranial abnormality. 2. Moderate chronic small vessel ischemic disease. 3. 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 4. 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 5. No evidence of major intracranial arterial occlusion. 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   Electronically Signed   By: Sebastian Ache   On: 07/27/2015 11:50   Mr Brain Wo Contrast  07/27/2015   CLINICAL DATA:  Syncope. Found down. Increased weakness the last 2 days. Prior stroke.  EXAM: MRI HEAD WITHOUT CONTRAST  MRA HEAD WITHOUT CONTRAST  TECHNIQUE: Multiplanar, multiecho pulse sequences of the brain and surrounding structures were obtained without intravenous contrast. Angiographic images of the head were obtained using MRA technique without contrast.  COMPARISON:  Head CT 07/27/2015  FINDINGS: MRI HEAD FINDINGS  There is no evidence of acute infarct, mass, midline shift, or extra-axial fluid collection. There is mild generalized cerebral atrophy. Patchy and confluent T2 hyperintensities involving the deep greater than subcortical cerebral white matter bilaterally and pons are nonspecific but compatible with moderate chronic small vessel ischemic disease. There may be 1 or 2 tiny chronic infarcts in the cerebellum.  There is a chronic hemorrhagic infarct involving the body of the right caudate nucleus and internal capsule. Numerous additional T2 hyperintense foci throughout the basal ganglia likely represent dilated perivascular spaces. Chronic  lacunar infarcts and microhemorrhages are noted in the thalami.  Prior bilateral cataract extraction is noted. Paranasal sinuses and mastoid air cells are clear. Major intracranial vascular flow voids are preserved.  MRA HEAD FINDINGS  The visualized distal vertebral arteries are patent with the right being dominant. PICA origins are patent. Right AICA origin is patent. SCA origins are patent. Basilar artery is patent without stenosis. There is a patent left posterior communicating artery. PCAs are patent with mild narrowing of the left P1 segment, although this may be at least partly developmental given the presence of a medium-sized ipsilateral posterior communicating artery. There is mild PCA branch vessel irregularity bilaterally.  Internal carotid arteries are patent from skullbase to carotid termini without stenosis. Anterior communicating artery is patent. Proximal A1 stenoses are moderate to severe on the right and mild  on the left. MCAs are patent with mild-to-moderate branch vessel irregularity but no evidence of significant proximal stenosis. No intracranial aneurysm is identified.  IMPRESSION: 1. No acute intracranial abnormality. 2. Moderate chronic small vessel ischemic disease. 3. 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 4. 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 5. No evidence of major intracranial arterial occlusion. 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   Electronically Signed   By: Sebastian Ache   On: 07/27/2015 11:50   Dg Chest Portable 1 View  07/26/2015   CLINICAL DATA:  Mallet pain for 1 week.  Hypotension today.  EXAM: PORTABLE CHEST - 1 VIEW  COMPARISON:  None.  FINDINGS: The lungs are clear. Heart size is normal. No pneumothorax or pleural effusion.  IMPRESSION: No acute disease.   Electronically Signed   By: Drusilla Kanner M.D.   On: 07/26/2015 19:15    Scheduled Meds: . aspirin EC  81 mg Oral Daily  . insulin aspart  0-9 Units Subcutaneous  6 times per day  . vancomycin  1,000 mg Intravenous Once   Continuous Infusions: . sodium chloride 100 mL/hr at 07/27/15 1949  . heparin 650 Units/hr (07/27/15 1302)    Time Spent: 40 min   Marinda Elk  [facility] Pager [phone]. If 7PM-7AM, please contact night-coverage at [email or web address], password [facility] 07/28/2015, 9:29 AM  LOS: 2 days

## 2015-07-28 NOTE — Consult Note (Signed)
   [facility] CM Inpatient Consult   07/28/2015  Dana Blair 1941-02-28 [id number]    Referral received. Patient evaluated for [facility] Care Management services. Patient is not eligible for [facility] Care Management services because unfortunately, patient's [facility] Medicare is not in the delegation for [facility] Care Management services at this time. Will update inpatient care manager of outcome. For questions, please contact: Charlesetta Shanks, RN BSN CCM [facility]  [phone] business mobile phone

## 2015-07-28 NOTE — Progress Notes (Signed)
Initial Nutrition Assessment  DOCUMENTATION CODES:   Severe malnutrition in context of acute illness/injury  INTERVENTION:    Ensure Enlive PO TID, each supplement provides 350 kcal and 20 grams of protein  NUTRITION DIAGNOSIS:   Malnutrition related to acute illness as evidenced by moderate depletions of muscle mass, moderate depletion of body fat.  GOAL:   Patient will meet greater than or equal to 90% of their needs  MONITOR:   PO intake, Supplement acceptance, Labs, Weight trends  REASON FOR ASSESSMENT:   Malnutrition Screening Tool    ASSESSMENT:   74 y.o. female who is brought in by son after being found down at home. Low BP in the ED, improved with 3 L IVF.  Labs reviewed: potassium low, BUN and creatinine elevated.  Patient reports that she has been eating poorly for the past 2 weeks and has lost weight, but unsure amount of weight loss or her usual weight. Nutrition-Focused physical exam completed. Findings are mild-moderate fat depletion, mild-moderate and severe muscle depletion, and no edema. She says she has had Ensure supplements before, likes vanilla and chocolate flavors.  Diet Order:  Diet heart healthy/carb modified Room service appropriate?: Yes; Fluid consistency:: Thin  Skin:  Reviewed, no issues  Last BM:  7/27  Height:   Ht Readings from Last 1 Encounters:  07/27/15  (1.626 m)    Weight:   Wt Readings from Last 1 Encounters:  07/28/15 121 lb (54.885 kg)    Ideal Body Weight:  54.4 kg  BMI:  Body mass index is 20.76 kg/(m^2).  Estimated Nutritional Needs:   Kcal:  1550-1750  Protein:  75-85 gm  Fluid:  1.6-1.8 L  EDUCATION NEEDS:   Education needs addressed   Joaquin Courts, RD, LDN, CNSC Pager [phone] After Hours Pager [phone]

## 2015-07-28 NOTE — Progress Notes (Signed)
Utilization review completed. Codi Kertz, RN, BSN. 

## 2015-07-29 DIAGNOSIS — E43 Unspecified severe protein-calorie malnutrition: Secondary | ICD-10-CM

## 2015-07-29 DIAGNOSIS — R55 Syncope and collapse: Secondary | ICD-10-CM

## 2015-07-29 LAB — BASIC METABOLIC PANEL
ANION GAP: 7 (ref 5–15)
Anion gap: 10 (ref 5–15)
BUN: 14 mg/dL (ref 6–20)
BUN: 18 mg/dL (ref 6–20)
CALCIUM: 8.7 mg/dL — AB (ref 8.9–10.3)
CO2: 24 mmol/L (ref 22–32)
CO2: 27 mmol/L (ref 22–32)
CREATININE: 1.01 mg/dL — AB (ref 0.44–1.00)
Calcium: 8.4 mg/dL — ABNORMAL LOW (ref 8.9–10.3)
Chloride: 107 mmol/L (ref 101–111)
Chloride: 107 mmol/L (ref 101–111)
Creatinine, Ser: 0.96 mg/dL (ref 0.44–1.00)
GFR calc Af Amer: >60 mL/min (ref >60)
GFR calc Af Amer: >60 mL/min (ref >60)
GFR, EST NON AFRICAN AMERICAN: 57 mL/min — AB (ref >60)
GFR, EST NON AFRICAN AMERICAN: 53 mL/min — AB (ref >60)
GLUCOSE: 405 mg/dL — AB (ref 65–99)
Glucose, Bld: 148 mg/dL — ABNORMAL HIGH (ref 65–99)
Potassium: 2.5 mmol/L — CL (ref 3.5–5.1)
Potassium: 2.9 mmol/L — ABNORMAL LOW (ref 3.5–5.1)
SODIUM: 141 mmol/L (ref 135–145)
SODIUM: 141 mmol/L (ref 135–145)

## 2015-07-29 LAB — URINALYSIS, ROUTINE W REFLEX MICROSCOPIC
Bilirubin Urine: NEGATIVE
GLUCOSE, UA: NEGATIVE mg/dL
KETONES UR: 15 mg/dL — AB
Leukocytes, UA: NEGATIVE
Nitrite: NEGATIVE
PH: 5.5 (ref 5.0–8.0)
Protein, ur: NEGATIVE mg/dL
SPECIFIC GRAVITY, URINE: 1.008 (ref 1.005–1.030)
Urobilinogen, UA: 0.2 mg/dL (ref 0.0–1.0)

## 2015-07-29 LAB — CBC
HEMATOCRIT: 40.7 % (ref 36.0–46.0)
Hemoglobin: 13.6 g/dL (ref 12.0–15.0)
MCH: 27.1 pg (ref 26.0–34.0)
MCHC: 33.4 g/dL (ref 30.0–36.0)
MCV: 81.1 fL (ref 78.0–100.0)
Platelets: 310 10*3/uL (ref 150–400)
RBC: 5.02 MIL/uL (ref 3.87–5.11)
RDW: 13.6 % (ref 11.5–15.5)
WBC: 18.6 10*3/uL — ABNORMAL HIGH (ref 4.0–10.5)

## 2015-07-29 LAB — GLUCOSE, CAPILLARY
GLUCOSE-CAPILLARY: 100 mg/dL — AB (ref 65–99)
GLUCOSE-CAPILLARY: 281 mg/dL — AB (ref 65–99)
Glucose-Capillary: 138 mg/dL — ABNORMAL HIGH (ref 65–99)
Glucose-Capillary: 174 mg/dL — ABNORMAL HIGH (ref 65–99)
Glucose-Capillary: 224 mg/dL — ABNORMAL HIGH (ref 65–99)
Glucose-Capillary: 375 mg/dL — ABNORMAL HIGH (ref 65–99)

## 2015-07-29 LAB — URINE CULTURE: Culture: 50000

## 2015-07-29 LAB — URINE MICROSCOPIC-ADD ON

## 2015-07-29 LAB — HEMOGLOBIN A1C
Hgb A1c MFr Bld: 6.3 % — ABNORMAL HIGH (ref 4.8–5.6)
Mean Plasma Glucose: 134 mg/dL

## 2015-07-29 LAB — MAGNESIUM: Magnesium: 1.5 mg/dL — ABNORMAL LOW (ref 1.7–2.4)

## 2015-07-29 MED ORDER — POTASSIUM CHLORIDE 10 MEQ/100ML IV SOLN
10.0000 meq | INTRAVENOUS | Status: AC
  Administered 2015-07-29 (×3): 10 meq via INTRAVENOUS
  Filled 2015-07-29 (×3): qty 100

## 2015-07-29 MED ORDER — MAGNESIUM SULFATE 2 GM/50ML IV SOLN
2.0000 g | Freq: Once | INTRAVENOUS | Status: AC
  Administered 2015-07-29: 2 g via INTRAVENOUS
  Filled 2015-07-29: qty 50

## 2015-07-29 MED ORDER — CEFTRIAXONE SODIUM IN DEXTROSE 20 MG/ML IV SOLN
1.0000 g | INTRAVENOUS | Status: DC
  Administered 2015-07-29 – 2015-08-01 (×4): 1 g via INTRAVENOUS
  Filled 2015-07-29 (×5): qty 50

## 2015-07-29 MED ORDER — NEBIVOLOL HCL 5 MG PO TABS
5.0000 mg | ORAL_TABLET | Freq: Every day | ORAL | Status: DC
  Administered 2015-07-29 – 2015-08-02 (×5): 5 mg via ORAL
  Filled 2015-07-29 (×5): qty 1

## 2015-07-29 MED ORDER — POTASSIUM CHLORIDE CRYS ER 20 MEQ PO TBCR
30.0000 meq | EXTENDED_RELEASE_TABLET | ORAL | Status: AC
  Administered 2015-07-29 (×2): 30 meq via ORAL
  Filled 2015-07-29 (×6): qty 1

## 2015-07-29 NOTE — Care Management Note (Signed)
Case Management Note  Patient Details  Name: Dana Blair MRN: [id number] Date of Birth: 1941/06/25  Subjective/Objective: Pt admitted for Fall and weakness. Pt is from home with family support of son. However, son works during the day long hours. Pt has no DME @ home. Per daughter she had been pretty independent.    Action/Plan: CM did speak with daughter in regards to disposition needs. Per daughter she would like to keep the pt in the home as long as possible since it is familiar to pt. CM will provide to daughter list of Personal Care Services that family will have to pay out of pocket. CM will also provide information for life alert button. PT /OT to consult for additional recommendations. CM will place a [facility] consult for pt as well to see if they can f/u in the community. CM will continue to monitor for additional disposition needs.   Expected Discharge Date:                  Expected Discharge Plan:  Home w Home Health Services  In-House Referral:  NA  Discharge planning Services  CM Consult  Post Acute Care Choice:  NA Choice offered to:  Patient, Adult Children  DME Arranged:  N/A DME Agency:  NA  HH Arranged:  RN, PT, Nurse's Aide HH Agency:  [facility]  Status of Service:  Completed, signed off  Medicare Important Message Given:  Yes-second notification given Date Medicare IM Given:    Medicare IM give by:    Date Additional Medicare IM Given:    Additional Medicare Important Message give by:     If discussed at Long Length of Stay Meetings, dates discussed:    Additional Comments: 07-29-15 [address] Tomi Bamberger, RN,BSN [phone] CM did speak with pt and daughter both agreeable to [facility] to provide services listed above. CM made referral with [facility] and [facility] to begin within 24-48 hrs post d/c. [facility] will not be able to f/u outpatient. Daughter has PCS list and she is aware in order for services to be set up family will have to call. No  further needs from CM at this time.   Gala Lewandowsky, RN 07/29/2015, 3:49 PM

## 2015-07-29 NOTE — Care Management Important Message (Signed)
Important Message  Patient Details  Name: Dana Blair MRN: [id number] Date of Birth: Jul 19, 1941   Medicare Important Message Given:  Yes-second notification given    [name] 07/29/2015, 12:30 PM

## 2015-07-29 NOTE — Progress Notes (Signed)
PATIENT DETAILS Name: Dana Blair Age: 74 y.o. Sex: female Date of Birth: 1941/07/10 Admit Date: 07/26/2015 Admitting Physician Hillary Bow, DO PCP:SUN,VYVYAN Y, MD  Subjective: No further diarrhea. Complains of frequent urination. Still dizzy upon standing up.  Assessment/Plan: Principal Problem: Syncope and collapse: Likely secondary to orthostatic hypotension. Although still orthostatic, appears clinically euvolemic. Stop IV fluids, place TED hose. Follow.  Active Problems: Acute renal failure: Resolved. Etiology felt to be multifactorial-from dehydration/diarrhea/lisinopril and HCTZ use. Follow electrolytes periodically.  Leukocytosis: Has persistent leukocytosis, but afebrile. Chronically sick appearing. UA/chest x-ray negative for UTI/pneumonia. Blood cultures negative, urine culture on 7/26 positive for 50,000 colonies of Escherichia coli-given bilateral hydronephrosis-will cover with IV Rocephin-although UA on same date negative. CT abdomen shows distended bladder with bilateral hydronephrosis and rectal thickening. Discontinue vancomycin.  Urinary retention: Place Foley catheter, discontinue IV fluids. Voiding trial over the next few days.  Rectal thickening seen on CT scan abdomen: GI consultation placed. Given weight loss-high suspicion for malignancy. Suspect will require endoscopic evaluation.  Chronic diastolic heart failure: Clinically compensated. Stop IV fluids. Continue Bystolic cautiously  Hypokalemia: Suspect secondary to GI loss. Replete and recheck.  Mildly elevated troponins: Noted in the pattern of ACS-likely demand ischemia-however given ST changes and EKG Will consult cardiology. 2-D echocardiogram shows no regional wall motion abnormality.  Essential hypertension: Although still orthostatic-BP no significantly high-stop IV fluids, cautiously started on Bystolic. Follow.  Type 2 diabetes: CBGs controlled with SSI. Resume oral  hypoglycemics on discharge  Dyslipidemia: Hold Vytorin for now.  Severe malnutrition: Continue supplements  Disposition: Remain inpatient-home with home health services in the next 2-3 days once workup is complete  Antimicrobial agents  See below  Anti-infectives    Start     Dose/Rate Route Frequency Ordered Stop   07/28/15 1000  vancomycin (VANCOCIN) IVPB 750 mg/150 ml premix  Status:  Discontinued     750 mg 150 mL/hr over 60 Minutes Intravenous Every 24 hours 07/27/15 0915 07/28/15 0901   07/27/15 1800  piperacillin-tazobactam (ZOSYN) IVPB 3.375 g  Status:  Discontinued     3.375 g 12.5 mL/hr over 240 Minutes Intravenous Every 8 hours 07/27/15 0915 07/28/15 0901   07/27/15 0915  vancomycin (VANCOCIN) IVPB 1000 mg/200 mL premix  Status:  Discontinued     1,000 mg 200 mL/hr over 60 Minutes Intravenous  Once 07/27/15 0913 07/29/15 0917   07/27/15 0915  piperacillin-tazobactam (ZOSYN) IVPB 3.375 g     3.375 g 100 mL/hr over 30 Minutes Intravenous  Once 07/27/15 0913 07/27/15 1200      DVT Prophylaxis: SCD's  Code Status: Full code  Family Communication Daughter at bedside  Procedures: None  CONSULTS:  cardiology and GI  Time spent 30 minutes-Greater than 50% of this time was spent in counseling, explanation of diagnosis, planning of further management, and coordination of care  MEDICATIONS: Scheduled Meds: . aspirin EC  81 mg Oral Daily  . feeding supplement (ENSURE ENLIVE)  237 mL Oral TID BM  . insulin aspart  0-9 Units Subcutaneous 6 times per day  . nebivolol  5 mg Oral Daily   Continuous Infusions:  PRN Meds:.    PHYSICAL EXAM: Vital signs in last 24 hours: Filed Vitals:   07/28/15 2007 07/29/15 0001 07/29/15 0351 07/29/15 0800  BP: 174/70 153/100  169/69  Pulse: 74 72 86 70  Temp: 98.3 F (36.8 C) 98.6 F (37 C) 99.1  F (37.3 C) 98.7 F (37.1 C)  TempSrc: Oral Oral Oral Oral  Resp: Height:      Weight:   50.122 kg (110 lb 8  oz)   SpO2: 99% 99% 100% 100%    Weight change: -4.309 kg (-9 lb 8 oz) Filed Weights   07/27/15 1152 07/28/15 0455 07/29/15 0351  Weight: 54.432 kg (120 lb) 54.885 kg (121 lb) 50.122 kg (110 lb 8 oz)   Body mass index is 18.96 kg/(m^2).   Gen Exam: Awake and alert with clear speech. Chronically sick appearing Neck: Supple, No JVD.   Chest: B/L Clear.   CVS: S1 S2 Regular, no murmurs.  Abdomen: soft, BS +, non tender, bladder palpable. Extremities: no edema, lower extremities warm to touch. Neurologic: Non Focal.   Skin: No Rash.   Wounds: N/A.   Intake/Output from previous day:  Intake/Output Summary (Last 24 hours) at 07/29/15 1317 Last data filed at 07/29/15 1159  Gross per 24 hour  Intake    240 ml  Output   1000 ml  Net   -760 ml     LAB RESULTS: CBC  Recent Labs Lab 07/26/15 1858 07/27/15 0320 07/27/15 1336 07/28/15 0430 07/29/15 0338  WBC 14.2* 18.7* 21.0* 21.6* 18.6*  HGB 14.6 17.3* 12.6 14.9 13.6  HCT 44.9 54.1* 37.8 42.7 40.7  PLT 333 159 317 PLATELET CLUMPS NOTED ON SMEAR, COUNT APPEARS DECREASED 310  MCV 86.3 83.1 82.4 79.7 81.1  MCH 28.1 26.6 27.5 27.8 27.1  MCHC 32.5 32.0 33.3 34.9 33.4  RDW 13.8 13.6 13.7 13.6 13.6  LYMPHSABS  --   --  2.1  --   --   MONOABS  --   --  1.2*  --   --   EOSABS  --   --  0.0  --   --   BASOSABS  --   --  0.0  --   --     Chemistries   Recent Labs Lab 07/26/15 1858 07/27/15 0320 07/28/15 0855 07/29/15 0338  NA 141 145 145 141  K 5.7* 3.7 3.1* 2.5*  CL 99* 113* 109 107  CO2 14* 20* 20* 24  GLUCOSE 165* 162* 174* 148*  BUN 42* 41* 22* 14  CREATININE 2.59* 1.47* 1.35* 0.96  CALCIUM 9.7 8.3* 8.8* 8.7*  MG  --   --  1.7 1.5*    CBG:  Recent Labs Lab 07/28/15 2005 07/28/15 2359 07/29/15 0343 07/29/15 0736 07/29/15 1132  GLUCAP 145* 174* 138* 100* 224*    GFR Estimated Creatinine Clearance: 40.7 mL/min (by C-G formula based on Cr of 0.96).  Coagulation profile No results for input(s): INR,  PROTIME in the last 168 hours.  Cardiac Enzymes  Recent Labs Lab 07/27/15 0859 07/27/15 1337 07/27/15 2130  TROPONINI 0.23* 0.24* 0.20*    Invalid input(s): POCBNP No results for input(s): DDIMER in the last 72 hours.  Recent Labs  07/27/15 1334 07/27/15 2130  HGBA1C 6.2* 6.3*    Recent Labs  07/27/15 0918 07/27/15 2130  CHOL 99 119  HDL 38* 45  LDLCALC 48 52  TRIG 65 111  CHOLHDL 2.6 2.6   No results for input(s): TSH, T4TOTAL, T3FREE, THYROIDAB in the last 72 hours.  Invalid input(s): FREET3 No results for input(s): VITAMINB12, FOLATE, FERRITIN, TIBC, IRON, RETICCTPCT in the last 72 hours.  Recent Labs  07/26/15 1858  LIPASE 64*    Urine Studies No results for input(s): UHGB, CRYS in the  last 72 hours.  Invalid input(s): UACOL, UAPR, USPG, UPH, UTP, UGL, UKET, UBIL, UNIT, UROB, ULEU, UEPI, UWBC, URBC, UBAC, CAST, UCOM, BILUA  MICROBIOLOGY: Recent Results (from the past 240 hour(s))  Blood culture (routine x 2)     Status: None (Preliminary result)   Collection Time: 07/26/15  6:58 PM  Result Value Ref Range Status   Specimen Description BLOOD LEFT ANTECUBITAL  Final   Special Requests BOTTLES DRAWN AEROBIC AND ANAEROBIC 2CC  Final   Culture NO GROWTH 2 DAYS  Final   Report Status PENDING  Incomplete  Blood culture (routine x 2)     Status: None (Preliminary result)   Collection Time: 07/26/15  7:43 PM  Result Value Ref Range Status   Specimen Description BLOOD LEFT HAND  Final   Special Requests BOTTLES DRAWN AEROBIC ONLY 3CC  Final   Culture NO GROWTH 2 DAYS  Final   Report Status PENDING  Incomplete  Urine culture     Status: None   Collection Time: 07/26/15  8:23 PM  Result Value Ref Range Status   Specimen Description URINE, CATHETERIZED  Final   Special Requests NONE  Final   Culture 50,000 COLONIES/mL ESCHERICHIA COLI  Final   Report Status 07/29/2015 FINAL  Final   Organism ID, Bacteria ESCHERICHIA COLI  Final      Susceptibility    Escherichia coli - MIC*    AMPICILLIN <=2 SENSITIVE Sensitive     CEFAZOLIN <=4 SENSITIVE Sensitive     CEFTRIAXONE <=1 SENSITIVE Sensitive     CIPROFLOXACIN <=0.25 SENSITIVE Sensitive     GENTAMICIN <=1 SENSITIVE Sensitive     IMIPENEM <=0.25 SENSITIVE Sensitive     NITROFURANTOIN <=16 SENSITIVE Sensitive     TRIMETH/SULFA <=20 SENSITIVE Sensitive     AMPICILLIN/SULBACTAM <=2 SENSITIVE Sensitive     PIP/TAZO <=4 SENSITIVE Sensitive     * 50,000 COLONIES/mL ESCHERICHIA COLI  Clostridium Difficile by PCR (not at [facility])     Status: None   Collection Time: 07/28/15  2:56 PM  Result Value Ref Range Status   C difficile by pcr NEGATIVE NEGATIVE Final    RADIOLOGY STUDIES/RESULTS: Ct Abdomen Pelvis Wo Contrast  07/28/2015   CLINICAL DATA:  Found unresponsive on the floor.  EXAM: CT ABDOMEN AND PELVIS WITHOUT CONTRAST  TECHNIQUE: Multidetector CT imaging of the abdomen and pelvis was performed following the standard protocol without IV contrast.  COMPARISON:  None.  FINDINGS: Atheromatous calcifications, including the coronary arteries. Small amount of linear atelectasis or scarring at both lung bases. Small liver cysts. Distended urinary bladder. Right posterior bladder diverticulum or loculated free peritoneal fluid. Left ovarian coarse calcification and 2.2 cm cyst. Surgically absent uterus.  Concentric inferior rectal wall thickening with irregular luminal narrowing. No bowel dilatation. No enlarged lymph nodes. Mild to moderate dilatation of both renal collecting systems, or proximal ureters and mid ureters. No obstructing mass or calculi seen.  No findings suspicious for appendicitis. Unremarkable non contrasted appearance of the spleen, pancreas, gallbladder and adrenal glands. Mild lumbar spine degenerative changes. 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  IMPRESSION: 1. Diffuse, concentric inferior rectal wall thickening with irregular luminal narrowing. This is  suspicious for a rectal neoplasm. Proctitis is less likely. 2. Mild to moderate bilateral hydronephrosis and hydroureter, most likely due to bladder distention. 3. 2.2 cm left ovarian cyst. This is almost certainly benign, but follow up ultrasound is recommended in 1 year according to the Society of Radiologists in  Ultrasound 2010 Consensus Conference Statement Grier Rocher et al. Management of Asymptomatic Ovarian and Other Adnexal Cysts Imaged at [place]: Society of Radiologists in Ultrasound Consensus Conference Statement 2010. Radiology 256 (Sept 2010): 943-954.). 4. Right posterior bladder diverticulum or loculated fluid. An ovarian cyst is less likely.   Electronically Signed   By: Beckie Salts M.D.   On: 07/28/2015 15:56   Ct Head Wo Contrast  07/27/2015   CLINICAL DATA:  Weakness and dizziness.  Hypotension.  EXAM: CT HEAD WITHOUT CONTRAST  TECHNIQUE: Contiguous axial images were obtained from the base of the skull through the vertex without intravenous contrast.  COMPARISON:  None.  FINDINGS: Chronic ischemic changes in the periventricular white matter. Mild global atrophy appropriate to age. No mass effect, midline shift, or acute intracranial hemorrhage. Mastoid air cells are clear. Minimal bubbly mucous material in the left sphenoid sinus. Visualized paranasal sinuses are otherwise clear. Cranium is intact.  IMPRESSION: No acute intracranial pathology.  Chronic changes are noted.   Electronically Signed   By: Jolaine Click M.D.   On: 07/27/2015 11:32   Mr Maxine Glenn Head Wo Contrast  07/27/2015   CLINICAL DATA:  Syncope. Found down. Increased weakness the last 2 days. Prior stroke.  EXAM: MRI HEAD WITHOUT CONTRAST  MRA HEAD WITHOUT CONTRAST  TECHNIQUE: Multiplanar, multiecho pulse sequences of the brain and surrounding structures were obtained without intravenous contrast. Angiographic images of the head were obtained using MRA technique without contrast.  COMPARISON:  Head CT 07/27/2015  FINDINGS: MRI HEAD  FINDINGS  There is no evidence of acute infarct, mass, midline shift, or extra-axial fluid collection. There is mild generalized cerebral atrophy. Patchy and confluent T2 hyperintensities involving the deep greater than subcortical cerebral white matter bilaterally and pons are nonspecific but compatible with moderate chronic small vessel ischemic disease. There may be 1 or 2 tiny chronic infarcts in the cerebellum.  There is a chronic hemorrhagic infarct involving the body of the right caudate nucleus and internal capsule. Numerous additional T2 hyperintense foci throughout the basal ganglia likely represent dilated perivascular spaces. Chronic lacunar infarcts and microhemorrhages are noted in the thalami.  Prior bilateral cataract extraction is noted. Paranasal sinuses and mastoid air cells are clear. Major intracranial vascular flow voids are preserved.  MRA HEAD FINDINGS  The visualized distal vertebral arteries are patent with the right being dominant. PICA origins are patent. Right AICA origin is patent. SCA origins are patent. Basilar artery is patent without stenosis. There is a patent left posterior communicating artery. PCAs are patent with mild narrowing of the left P1 segment, although this may be at least partly developmental given the presence of a medium-sized ipsilateral posterior communicating artery. There is mild PCA branch vessel irregularity bilaterally.  Internal carotid arteries are patent from skullbase to carotid termini without stenosis. Anterior communicating artery is patent. Proximal A1 stenoses are moderate to severe on the right and mild on the left. MCAs are patent with mild-to-moderate branch vessel irregularity but no evidence of significant proximal stenosis. No intracranial aneurysm is identified.  IMPRESSION: 1. No acute intracranial abnormality. 2. Moderate chronic small vessel ischemic disease. 3. 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 4. 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 5. No evidence of major intracranial arterial occlusion. 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   Electronically Signed   By: Sebastian Ache   On: 07/27/2015 11:50   Mr Brain Wo Contrast  07/27/2015   CLINICAL DATA:  Syncope. Found  down. Increased weakness the last 2 days. Prior stroke.  EXAM: MRI HEAD WITHOUT CONTRAST  MRA HEAD WITHOUT CONTRAST  TECHNIQUE: Multiplanar, multiecho pulse sequences of the brain and surrounding structures were obtained without intravenous contrast. Angiographic images of the head were obtained using MRA technique without contrast.  COMPARISON:  Head CT 07/27/2015  FINDINGS: MRI HEAD FINDINGS  There is no evidence of acute infarct, mass, midline shift, or extra-axial fluid collection. There is mild generalized cerebral atrophy. Patchy and confluent T2 hyperintensities involving the deep greater than subcortical cerebral white matter bilaterally and pons are nonspecific but compatible with moderate chronic small vessel ischemic disease. There may be 1 or 2 tiny chronic infarcts in the cerebellum.  There is a chronic hemorrhagic infarct involving the body of the right caudate nucleus and internal capsule. Numerous additional T2 hyperintense foci throughout the basal ganglia likely represent dilated perivascular spaces. Chronic lacunar infarcts and microhemorrhages are noted in the thalami.  Prior bilateral cataract extraction is noted. Paranasal sinuses and mastoid air cells are clear. Major intracranial vascular flow voids are preserved.  MRA HEAD FINDINGS  The visualized distal vertebral arteries are patent with the right being dominant. PICA origins are patent. Right AICA origin is patent. SCA origins are patent. Basilar artery is patent without stenosis. There is a patent left posterior communicating artery. PCAs are patent with mild narrowing of the left P1 segment, although this may be at least partly developmental given the presence  of a medium-sized ipsilateral posterior communicating artery. There is mild PCA branch vessel irregularity bilaterally.  Internal carotid arteries are patent from skullbase to carotid termini without stenosis. Anterior communicating artery is patent. Proximal A1 stenoses are moderate to severe on the right and mild on the left. MCAs are patent with mild-to-moderate branch vessel irregularity but no evidence of significant proximal stenosis. No intracranial aneurysm is identified.  IMPRESSION: 1. No acute intracranial abnormality. 2. Moderate chronic small vessel ischemic disease. 3. 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 4. 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 5. No evidence of major intracranial arterial occlusion. 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   Electronically Signed   By: Sebastian Ache   On: 07/27/2015 11:50   Dg Chest Portable 1 View  07/26/2015   CLINICAL DATA:  Mallet pain for 1 week.  Hypotension today.  EXAM: PORTABLE CHEST - 1 VIEW  COMPARISON:  None.  FINDINGS: The lungs are clear. Heart size is normal. No pneumothorax or pleural effusion.  IMPRESSION: No acute disease.   Electronically Signed   By: Drusilla Kanner M.D.   On: 07/26/2015 19:15    Jeoffrey Massed, MD  [facility] Pager:[phone]  If 7PM-7AM, please contact night-coverage [email or web address] Password [email or web address] 07/29/2015, 1:17 PM   LOS: 3 days

## 2015-07-29 NOTE — Consult Note (Signed)
[facility] Consult Note  Referring Provider: No ref. provider found Primary Care Physician:  Leanor Rubenstein, MD Primary Gastroenterologist:  Dr.  Antony Contras Complaint: Rectal mass HPI: Dana Blair is an 74 y.o. black female  who is admitted with weakness and orthostasis and underwent an abdominal CT scan today which shows rectal thickening suspicious for a mass. The patient is not sure she's ever had a colonoscopy. She denies rectal bleeding and she is not anemic.  Past Medical History  Diagnosis Date  . Diabetes     Diabetes mellitus with chronic kidney disease   . Hypertension     [facility]- ECHO 06/29/09 - LVH, normal EF, renal ultrasound-no renal artery stenosis  . Hyperlipidemia   . 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 She had concomitant respiratory failure. On May 22, 2008, CT of the head without contrast demonstrated a 9 x 20 mm acute hematoma within the head of the right caudate with intraventricular extension of hemorrhage. Neurosurgery consult-no ventriculostomy.  . Glaucoma   . Osteopenia   . Chronic kidney disease   . Syncope 07/26/2015    Past Surgical History  Procedure Laterality Date  . Other surgical history  1970    Hysterectomy  . Abdominal hysterectomy      Medications Prior to Admission  Medication Sig Dispense Refill  . alendronate (FOSAMAX) 70 MG tablet Take 70 mg by mouth once a week. Take with a full glass of water on an empty stomach.    Marland Kitchen amLODipine (NORVASC) 10 MG tablet TAKE ONE TABLET BY MOUTH ONCE DAILY 30 tablet 5  . aspirin 81 MG tablet Take 81 mg by mouth daily.    . cloNIDine (CATAPRES) 0.1 MG tablet Take 0.5 tablets (0.05 mg total) by mouth 2 (two) times daily. (Patient taking differently: Take 0.05 mg by mouth daily. ) 30 tablet 11  . ezetimibe-simvastatin (VYTORIN) 10-20 MG per tablet Take 1 tablet by mouth daily. 30 tablet 1  . lisinopril-hydrochlorothiazide (PRINZIDE) 20-12.5 MG per tablet Take 1 tablet by mouth  daily. 30 tablet 5  . Nebivolol HCl 20 MG TABS Take 1 tablet (20 mg total) by mouth daily. 30 tablet 1  . sitaGLIPtin-metformin (JANUMET) 50-1000 MG per tablet Take 1 tablet by mouth 2 (two) times daily with a meal.      Allergies: No Known Allergies  Family History  Problem Relation Age of Onset  . Diabetes Mother   . Diabetes Father   . Diabetes Sister     Social History:  reports that she has never smoked. She has never used smokeless tobacco. She reports that she does not drink alcohol or use illicit drugs.  Review of Systems: negative except as above   Blood pressure 169/69, pulse 70, temperature 98.7 F (37.1 C), temperature source Oral, resp. rate 17, height 5\' 4"  (1.626 m), weight 50.122 kg (110 lb 8 oz), SpO2 100 %. Head: Normocephalic, without obvious abnormality, atraumatic Neck: no adenopathy, no carotid bruit, no JVD, supple, symmetrical, trachea midline and thyroid not enlarged, symmetric, no tenderness/mass/nodules Resp: clear to auscultation bilaterally Cardio: regular rate and rhythm, S1, S2 normal, no murmur, click, rub or gallop GI: Abdomen soft nondistended with normoactive bowel sounds. No hepatomegaly masses or guarding. Extremities: extremities normal, atraumatic, no cyanosis or edema  Results for orders placed or performed during the hospital encounter of 07/26/15 (from the past 48 hour(s))  Glucose, capillary     Status: Abnormal   Collection Time: 07/27/15  4:30 PM  Result Value Ref Range   Glucose-Capillary 120 (H) 65 - 99 mg/dL   Comment 1 Notify RN    Comment 2 Document in Chart   Heparin level (unfractionated)     Status: None   Collection Time: 07/27/15  6:41 PM  Result Value Ref Range   Heparin Unfractionated 0.53 0.30 - 0.70 IU/mL    Comment:        IF HEPARIN RESULTS ARE BELOW EXPECTED VALUES, AND PATIENT DOSAGE HAS BEEN CONFIRMED, SUGGEST FOLLOW UP TESTING OF ANTITHROMBIN III LEVELS.   Glucose, capillary     Status: Abnormal    Collection Time: 07/27/15  8:30 PM  Result Value Ref Range   Glucose-Capillary 209 (H) 65 - 99 mg/dL  Troponin I (q 6hr x 3)     Status: Abnormal   Collection Time: 07/27/15  9:30 PM  Result Value Ref Range   Troponin I 0.20 (H) <0.031 ng/mL    Comment:        PERSISTENTLY INCREASED TROPONIN VALUES IN THE RANGE OF 0.04-0.49 ng/mL CAN BE SEEN IN:       -UNSTABLE ANGINA       -CONGESTIVE HEART FAILURE       -MYOCARDITIS       -CHEST TRAUMA       -ARRYHTHMIAS       -LATE PRESENTING MYOCARDIAL INFARCTION       -COPD   CLINICAL FOLLOW-UP RECOMMENDED.   Hemoglobin A1c     Status: Abnormal   Collection Time: 07/27/15  9:30 PM  Result Value Ref Range   Hgb A1c MFr Bld 6.3 (H) 4.8 - 5.6 %    Comment: (NOTE)         Pre-diabetes: 5.7 - 6.4         Diabetes: >6.4         Glycemic control for adults with diabetes: <7.0    Mean Plasma Glucose 134 mg/dL    Comment: (NOTE) Performed At: [facility] [address] [place], [place] [id number] Mila Homer MD [id number]   Lipid panel     Status: None   Collection Time: 07/27/15  9:30 PM  Result Value Ref Range   Cholesterol 119 0 - 200 mg/dL   Triglycerides 586 <897 mg/dL   HDL 45 >74 mg/dL   Total CHOL/HDL Ratio 2.6 RATIO   VLDL 22 0 - 40 mg/dL   LDL Cholesterol 52 0 - 99 mg/dL    Comment:        Total Cholesterol/HDL:CHD Risk Coronary Heart Disease Risk Table                     Men   Women  1/2 Average Risk   3.4   3.3  Average Risk       5.0   4.4  2 X Average Risk   9.6   7.1  3 X Average Risk  23.4   11.0        Use the calculated Patient Ratio above and the CHD Risk Table to determine the patient's CHD Risk.        ATP III CLASSIFICATION (LDL):  <100     mg/dL   Optimal  813-638  mg/dL   Near or Above                    Optimal  130-159  mg/dL   Borderline  236-540  mg/dL   High  >388  mg/dL   Very High   Glucose, capillary     Status: Abnormal   Collection Time: 07/28/15 12:17 AM  Result  Value Ref Range   Glucose-Capillary 139 (H) 65 - 99 mg/dL  Glucose, capillary     Status: Abnormal   Collection Time: 07/28/15  4:29 AM  Result Value Ref Range   Glucose-Capillary 153 (H) 65 - 99 mg/dL  CBC     Status: Abnormal   Collection Time: 07/28/15  4:30 AM  Result Value Ref Range   WBC 21.6 (H) 4.0 - 10.5 K/uL    Comment: WHITE COUNT CONFIRMED ON SMEAR CONSISTENT WITH PREVIOUS RESULT    RBC 5.36 (H) 3.87 - 5.11 MIL/uL   Hemoglobin 14.9 12.0 - 15.0 g/dL   HCT 42.7 36.0 - 46.0 %   MCV 79.7 78.0 - 100.0 fL   MCH 27.8 26.0 - 34.0 pg   MCHC 34.9 30.0 - 36.0 g/dL   RDW 13.6 11.5 - 15.5 %   Platelets  150 - 400 K/uL    PLATELET CLUMPS NOTED ON SMEAR, COUNT APPEARS DECREASED  Heparin level (unfractionated)     Status: None   Collection Time: 07/28/15  4:30 AM  Result Value Ref Range   Heparin Unfractionated 0.31 0.30 - 0.70 IU/mL    Comment:        IF HEPARIN RESULTS ARE BELOW EXPECTED VALUES, AND PATIENT DOSAGE HAS BEEN CONFIRMED, SUGGEST FOLLOW UP TESTING OF ANTITHROMBIN III LEVELS.   Glucose, capillary     Status: Abnormal   Collection Time: 07/28/15  7:37 AM  Result Value Ref Range   Glucose-Capillary 136 (H) 65 - 99 mg/dL   Comment 1 Notify RN    Comment 2 Document in Chart   Comprehensive metabolic panel     Status: Abnormal   Collection Time: 07/28/15  8:55 AM  Result Value Ref Range   Sodium 145 135 - 145 mmol/L   Potassium 3.1 (L) 3.5 - 5.1 mmol/L   Chloride 109 101 - 111 mmol/L   CO2 20 (L) 22 - 32 mmol/L   Glucose, Bld 174 (H) 65 - 99 mg/dL   BUN 22 (H) 6 - 20 mg/dL   Creatinine, Ser 1.35 (H) 0.44 - 1.00 mg/dL   Calcium 8.8 (L) 8.9 - 10.3 mg/dL   Total Protein 7.5 6.5 - 8.1 g/dL   Albumin 2.9 (L) 3.5 - 5.0 g/dL   AST 100 (H) 15 - 41 U/L   ALT 152 (H) 14 - 54 U/L   Alkaline Phosphatase 74 38 - 126 U/L   Total Bilirubin 1.2 0.3 - 1.2 mg/dL   GFR calc non Af Amer 38 (L) >60 mL/min   GFR calc Af Amer 44 (L) >60 mL/min    Comment: (NOTE) The eGFR has  been calculated using the CKD EPI equation. This calculation has not been validated in all clinical situations. eGFR's persistently <60 mL/min signify possible Chronic Kidney Disease.    Anion gap 16 (H) 5 - 15  Magnesium     Status: None   Collection Time: 07/28/15  8:55 AM  Result Value Ref Range   Magnesium 1.7 1.7 - 2.4 mg/dL  Glucose, capillary     Status: Abnormal   Collection Time: 07/28/15 11:40 AM  Result Value Ref Range   Glucose-Capillary 161 (H) 65 - 99 mg/dL   Comment 1 Notify RN    Comment 2 Document in Chart   Clostridium Difficile by PCR (not at [facility])  Status: None   Collection Time: 07/28/15  2:56 PM  Result Value Ref Range   C difficile by pcr NEGATIVE NEGATIVE  Glucose, capillary     Status: Abnormal   Collection Time: 07/28/15  4:22 PM  Result Value Ref Range   Glucose-Capillary 138 (H) 65 - 99 mg/dL   Comment 1 Notify RN    Comment 2 Document in Chart   Glucose, capillary     Status: Abnormal   Collection Time: 07/28/15  8:05 PM  Result Value Ref Range   Glucose-Capillary 145 (H) 65 - 99 mg/dL  Glucose, capillary     Status: Abnormal   Collection Time: 07/28/15 11:59 PM  Result Value Ref Range   Glucose-Capillary 174 (H) 65 - 99 mg/dL  CBC     Status: Abnormal   Collection Time: 07/29/15  3:38 AM  Result Value Ref Range   WBC 18.6 (H) 4.0 - 10.5 K/uL   RBC 5.02 3.87 - 5.11 MIL/uL   Hemoglobin 13.6 12.0 - 15.0 g/dL   HCT 40.7 36.0 - 46.0 %   MCV 81.1 78.0 - 100.0 fL   MCH 27.1 26.0 - 34.0 pg   MCHC 33.4 30.0 - 36.0 g/dL   RDW 13.6 11.5 - 15.5 %   Platelets 310 150 - 400 K/uL  Magnesium     Status: Abnormal   Collection Time: 07/29/15  3:38 AM  Result Value Ref Range   Magnesium 1.5 (L) 1.7 - 2.4 mg/dL  Basic metabolic panel     Status: Abnormal   Collection Time: 07/29/15  3:38 AM  Result Value Ref Range   Sodium 141 135 - 145 mmol/L   Potassium 2.5 (LL) 3.5 - 5.1 mmol/L    Comment: REPEATED TO VERIFY CRITICAL RESULT CALLED TO, READ BACK  BY AND VERIFIED WITH: Estevan Ryder [id number] WILDERK    Chloride 107 101 - 111 mmol/L   CO2 24 22 - 32 mmol/L   Glucose, Bld 148 (H) 65 - 99 mg/dL   BUN 14 6 - 20 mg/dL   Creatinine, Ser 0.96 0.44 - 1.00 mg/dL   Calcium 8.7 (L) 8.9 - 10.3 mg/dL   GFR calc non Af Amer 57 (L) >60 mL/min   GFR calc Af Amer >60 >60 mL/min    Comment: (NOTE) The eGFR has been calculated using the CKD EPI equation. This calculation has not been validated in all clinical situations. eGFR's persistently <60 mL/min signify possible Chronic Kidney Disease.    Anion gap 10 5 - 15  Glucose, capillary     Status: Abnormal   Collection Time: 07/29/15  3:43 AM  Result Value Ref Range   Glucose-Capillary 138 (H) 65 - 99 mg/dL  Glucose, capillary     Status: Abnormal   Collection Time: 07/29/15  7:36 AM  Result Value Ref Range   Glucose-Capillary 100 (H) 65 - 99 mg/dL  Urinalysis, Routine w reflex microscopic (not at [facility])     Status: Abnormal   Collection Time: 07/29/15 11:27 AM  Result Value Ref Range   Color, Urine YELLOW YELLOW   APPearance CLEAR CLEAR   Specific Gravity, Urine 1.008 1.005 - 1.030   pH 5.5 5.0 - 8.0   Glucose, UA NEGATIVE NEGATIVE mg/dL   Hgb urine dipstick LARGE (A) NEGATIVE   Bilirubin Urine NEGATIVE NEGATIVE   Ketones, ur 15 (A) NEGATIVE mg/dL   Protein, ur NEGATIVE NEGATIVE mg/dL   Urobilinogen, UA 0.2 0.0 - 1.0 mg/dL   Nitrite NEGATIVE NEGATIVE  Leukocytes, UA NEGATIVE NEGATIVE  Urine microscopic-add on     Status: None   Collection Time: 07/29/15 11:27 AM  Result Value Ref Range   Squamous Epithelial / LPF RARE RARE   WBC, UA 0-2 <3 WBC/hpf   RBC / HPF 11-20 <3 RBC/hpf  Glucose, capillary     Status: Abnormal   Collection Time: 07/29/15 11:32 AM  Result Value Ref Range   Glucose-Capillary 224 (H) 65 - 99 mg/dL  Basic metabolic panel     Status: Abnormal   Collection Time: 07/29/15  2:00 PM  Result Value Ref Range   Sodium 141 135 - 145 mmol/L   Potassium 2.9 (L) 3.5  - 5.1 mmol/L   Chloride 107 101 - 111 mmol/L   CO2 27 22 - 32 mmol/L   Glucose, Bld 405 (H) 65 - 99 mg/dL   BUN 18 6 - 20 mg/dL   Creatinine, Ser 1.01 (H) 0.44 - 1.00 mg/dL   Calcium 8.4 (L) 8.9 - 10.3 mg/dL   GFR calc non Af Amer 53 (L) >60 mL/min   GFR calc Af Amer >60 >60 mL/min    Comment: (NOTE) The eGFR has been calculated using the CKD EPI equation. This calculation has not been validated in all clinical situations. eGFR's persistently <60 mL/min signify possible Chronic Kidney Disease.    Anion gap 7 5 - 15   Ct Abdomen Pelvis Wo Contrast  07/28/2015   CLINICAL DATA:  Found unresponsive on the floor.  EXAM: CT ABDOMEN AND PELVIS WITHOUT CONTRAST  TECHNIQUE: Multidetector CT imaging of the abdomen and pelvis was performed following the standard protocol without IV contrast.  COMPARISON:  None.  FINDINGS: Atheromatous calcifications, including the coronary arteries. Small amount of linear atelectasis or scarring at both lung bases. Small liver cysts. Distended urinary bladder. Right posterior bladder diverticulum or loculated free peritoneal fluid. Left ovarian coarse calcification and 2.2 cm cyst. Surgically absent uterus.  Concentric inferior rectal wall thickening with irregular luminal narrowing. No bowel dilatation. No enlarged lymph nodes. Mild to moderate dilatation of both renal collecting systems, or proximal ureters and mid ureters. No obstructing mass or calculi seen.  No findings suspicious for appendicitis. Unremarkable non contrasted appearance of the spleen, pancreas, gallbladder and adrenal glands. Mild lumbar spine degenerative changes. 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  IMPRESSION: 1. Diffuse, concentric inferior rectal wall thickening with irregular luminal narrowing. This is suspicious for a rectal neoplasm. Proctitis is less likely. 2. Mild to moderate bilateral hydronephrosis and hydroureter, most likely due to bladder distention. 3.  2.2 cm left ovarian cyst. This is almost certainly benign, but follow up ultrasound is recommended in 1 year according to the Society of Radiologists in Ultrasound 2010 Consensus Conference Statement (D Clovis Riley et al. Management of Asymptomatic Ovarian and Other Adnexal Cysts Imaged at [place]: Society of Radiologists in [place] Statement 2010. Radiology 256 (Sept 2010): 943-954.). 4. Right posterior bladder diverticulum or loculated fluid. An ovarian cyst is less likely.   Electronically Signed   By: Claudie Revering M.D.   On: 07/28/2015 15:56    Assessment: Rectal thickening suspicious for neoplasm on CT scan Plan:  Flexible sigmoidoscopy in the morning. Camylle Whicker C 07/29/2015, 3:39 PM  Pager [phone] If no answer or after 5 PM call [phone]

## 2015-07-29 NOTE — Evaluation (Signed)
Physical Therapy Evaluation Patient Details Name: MARSELLA SUMAN MRN: [id number] DOB: 19-Sep-1941 Today's Date: 07/29/2015   History of Present Illness  Dana Blair is a 74 y.o. female who is brought in by son after being found down at home. Had generalized weakness onset 2 days ago. At baseline is ambulatory and lives alone. Patients son found mother on floor at 6pm tonight, unclear how long she was down for. Patient states she was dizzy before she fell, denies hitting head, denies LOC.  Found to be significantly dehydrated.  Clinical Impression  Pt admitted with/for fall, weakness and found to be significantly dehydrated.  Pt currently limited functionally due to the problems listed below.  (see problems list.)  Pt will benefit from PT to maximize function and safety to be able to get home safely with available assist of family.     Follow Up Recommendations Home health PT;Supervision for mobility/OOB    Equipment Recommendations  Other (comment) (TBA)    Recommendations for Other Services       Precautions / Restrictions Precautions Precautions: Fall Restrictions Weight Bearing Restrictions: No      Mobility  Bed Mobility Overal bed mobility: Needs Assistance Bed Mobility: Rolling;Sidelying to Sit Rolling: Min guard Sidelying to sit: Min guard       General bed mobility comments: no rail, no assist, but extra time  Transfers Overall transfer level: Needs assistance   Transfers: Sit to/from Stand Sit to Stand: Min assist         General transfer comment: cues for hand placement  Ambulation/Gait             General Gait Details: deferred due to observance of bloody discharge or urine.  Stairs            Wheelchair Mobility    Modified Rankin (Stroke Patients Only)       Balance Overall balance assessment: Needs assistance Sitting-balance support: No upper extremity supported Sitting balance-Leahy Scale: Good Sitting balance -  Comments: based on reactions while donning socks   Standing balance support: No upper extremity supported Standing balance-Leahy Scale: Poor Standing balance comment: needing assistance                             Pertinent Vitals/Pain Pain Assessment: Faces Faces Pain Scale: No hurt    Home Living Family/patient expects to be discharged to:: Private residence Living Arrangements: Children (son stays with her) Available Help at Discharge: Family Type of Home: House Home Access: Stairs to enter Entrance Stairs-Rails: None Entrance Stairs-Number of Steps: 1/3 Home Layout: One level Home Equipment: Shower seat Additional Comments: doesn't drive, son runs errands,  goes to church.    Prior Function Level of Independence: Needs assistance         Comments: son does chores for her.     Hand Dominance        Extremity/Trunk Assessment   Upper Extremity Assessment: Defer to OT evaluation           Lower Extremity Assessment: RLE deficits/detail;LLE deficits/detail RLE Deficits / Details: proximal weakness 4/5, quads >4/5 df/pf 4/5 LLE Deficits / Details: Proximally weak at 4- to 4/5, quads 4/5, df/pf 4/5     Communication   Communication: No difficulties  Cognition Arousal/Alertness: Awake/alert Behavior During Therapy: [facility] for tasks assessed/performed                        General  Comments      Exercises        Assessment/Plan    PT Assessment Patient needs continued PT services  PT Diagnosis Generalized weakness   PT Problem List Decreased strength;Decreased activity tolerance;Decreased balance;Decreased mobility;Decreased knowledge of use of DME  PT Treatment Interventions Gait training;Stair training;Functional mobility training;Therapeutic activities;Balance training;Patient/family education   PT Goals (Current goals can be found in the Care Plan section) Acute Rehab PT Goals Patient Stated Goal: back home PT Goal  Formulation: With patient Time For Goal Achievement: 07/29/15 Potential to Achieve Goals: Good    Frequency Min 3X/week   Barriers to discharge        Co-evaluation               End of Session   Activity Tolerance: Patient tolerated treatment well Patient left: in bed;with nursing/sitter in room Nurse Communication: Mobility status         Time: 0931-1000 PT Time Calculation (min) (ACUTE ONLY): 29 min   Charges:   PT Evaluation $Initial PT Evaluation Tier I: 1 Procedure PT Treatments $Therapeutic Activity: 8-22 mins   PT G Codes:        Zephan Beauchaine, Eliseo Gum 07/29/2015, 10:32 AM 07/29/2015  [place] Bing, PT [phone]  (pager)

## 2015-07-29 NOTE — Progress Notes (Addendum)
PT working with pt when upon standing moderate amount of dark fluid noted, unable to determine if urine or stool, Bladder scan per MD order with 850 ml residual, foley catheter placed with 400 ml clear straw colored urine returned with moderate amount of rust colored sedement, pt then had incidence of small very watery dark brown stool, Raymon Mutton RN

## 2015-07-29 NOTE — Consult Note (Signed)
CARDIOLOGY CONSULT NOTE   Patient ID: Dana Blair MRN: [id number], DOB/AGE: 74-Nov-1942   Admit date: 07/26/2015 Date of Consult: 07/29/2015   Primary Physician: Leanor Rubenstein, MD Primary Cardiologist: Dr. Anne Fu  Pt. Profile  74 year old woman found down at home by her son on 07/26/15.  She has a past history of hypertension and mild valvular heart disease.  No history of ischemic heart disease.  Problem List  Past Medical History  Diagnosis Date  . Diabetes     Diabetes mellitus with chronic kidney disease   . Hypertension     [facility]- ECHO 06/29/09 - LVH, normal EF, renal ultrasound-no renal artery stenosis  . Hyperlipidemia   . 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 She had concomitant respiratory failure. On May 22, 2008, CT of the head without contrast demonstrated a 9 x 20 mm acute hematoma within the head of the right caudate with intraventricular extension of hemorrhage. Neurosurgery consult-no ventriculostomy.  . Glaucoma   . Osteopenia   . Chronic kidney disease   . Syncope 07/26/2015    Past Surgical History  Procedure Laterality Date  . Other surgical history  1970    Hysterectomy  . Abdominal hysterectomy       Allergies  No Known Allergies  HPI   This pleasant 74 year old woman is followed from a cardiac standpoint by Dr. Anne Fu.  She does not have any history of ischemic heart disease.  She has a history of hypertension and an old stroke.  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  She has a history of hypercholesterolemia.  She does not have any history of exertional chest pain or angina pectoris.  She was in her usual state of health until approximately July 24 when she began to feel weak.  She does not recall any change in her bladder or bowel habits but she apparently was taking in less food and fluids.  On the evening of July 26 her son returned home in the evening and found her down on the  floor.  She does not recall any symptoms leading up to her episode of possible syncope.  She was brought to the emergency room and was found to have evidence of significant dehydration with serum creatinine of 2.59.  Her systolic blood pressure was in the 60s on arrival.  She responded to 3 L of saline.  Subsequently her troponins have been elevated and a plateau pattern of 0.23, 0.24, and 0.20.  She denies any chest pain or angina.  She has had significant leukocytosis with white cell count of 21,000 yesterday and 18,600 today.  She was on Prinzide at home and here in the hospital has had significant hypokalemia.  All of her home medications are currently on hold except for a baby aspirin daily.  Here in the hospital her telemetry has shown normal sinus rhythm.  Most recent EKG on 07/26/15 showed ST-T wave abnormalities which may have been secondary to her severe hypokalemia.  Workup here in the hospital has included a CT scan of the abdomen which has suggested possible rectal neoplasm.  She is not anemic.     Inpatient Medications  . aspirin EC  81 mg Oral Daily  . feeding supplement (ENSURE ENLIVE)  237 mL Oral TID BM  . insulin aspart  0-9 Units Subcutaneous 6 times per day    Family History Family History  Problem Relation Age of Onset  .  Diabetes Mother   . Diabetes Father   . Diabetes Sister      Social History History   Social History  . Marital Status: Married    Spouse Name: N/A  . Number of Children: N/A  . Years of Education: N/A   Occupational History  . Not on file.   Social History Main Topics  . Smoking status: Never Smoker   . Smokeless tobacco: Never Used  . Alcohol Use: No  . Drug Use: No  . Sexual Activity: Not on file   Other Topics Concern  . Not on file   Social History Narrative     Review of Systems  General:  No chills, fever, night sweats or weight changes.  Cardiovascular:  No chest pain, dyspnea on exertion, edema, orthopnea, palpitations,  paroxysmal nocturnal dyspnea. Dermatological: No rash, lesions/masses Respiratory: No cough, dyspnea Urologic: No hematuria, dysuria Abdominal:   No nausea, vomiting, , bright red blood per rectum, melena, or hematemesis.  Apparent history of loose stools for several days prior to admission Neurologic:  No visual changes, wkns, changes in mental status. All other systems reviewed and are otherwise negative except as noted above.  Physical Exam  Blood pressure 169/69, pulse 70, temperature 98.7 F (37.1 C), temperature source Oral, resp. rate 17, height 5\' 4"  (1.626 m), weight 110 lb 8 oz (50.122 kg), SpO2 100 %.  General: Pleasant, NAD.  Her memory appears to be somewhat limited.  Not a good historian. Psych: Normal affect. Neuro: Marland Kitchen Moves all extremities spontaneously. HEENT: Normal  Neck: Supple without bruits or JVD. Lungs:  Resp regular and unlabored, CTA. Heart: Regular sinus rhythm.  Grade 2/6 systolic murmur at lower left sternal edge. Abdomen: Soft, non-tender, non-distended, BS + x 4.  Extremities: No clubbing, cyanosis or edema. DP/PT/Radials 2+ and equal bilaterally.  Labs   Recent Labs  07/26/15 1858 07/27/15 0859 07/27/15 1337 07/27/15 2130  CKTOTAL 157  --   --   --   TROPONINI  --  0.23* 0.24* 0.20*   Lab Results  Component Value Date   WBC 18.6* 07/29/2015   HGB 13.6 07/29/2015   HCT 40.7 07/29/2015   MCV 81.1 07/29/2015   PLT 310 07/29/2015     Recent Labs Lab 07/28/15 0855 07/29/15 0338  NA 145 141  K 3.1* 2.5*  CL 109 107  CO2 20* 24  BUN 22* 14  CREATININE 1.35* 0.96  CALCIUM 8.8* 8.7*  PROT 7.5  --   BILITOT 1.2  --   ALKPHOS 74  --   ALT 152*  --   AST 100*  --   GLUCOSE 174* 148*   Lab Results  Component Value Date   CHOL 119 07/27/2015   HDL 45 07/27/2015   LDLCALC 52 07/27/2015   TRIG 111 07/27/2015   No results found for: DDIMER  Radiology/Studies  Ct Abdomen Pelvis Wo Contrast  07/28/2015   CLINICAL DATA:  Found  unresponsive on the floor.  EXAM: CT ABDOMEN AND PELVIS WITHOUT CONTRAST  TECHNIQUE: Multidetector CT imaging of the abdomen and pelvis was performed following the standard protocol without IV contrast.  COMPARISON:  None.  FINDINGS: Atheromatous calcifications, including the coronary arteries. Small amount of linear atelectasis or scarring at both lung bases. Small liver cysts. Distended urinary bladder. Right posterior bladder diverticulum or loculated free peritoneal fluid. Left ovarian coarse calcification and 2.2 cm cyst. Surgically absent uterus.  Concentric inferior rectal wall thickening with irregular luminal narrowing. No bowel dilatation. No  enlarged lymph nodes. Mild to moderate dilatation of both renal collecting systems, or proximal ureters and mid ureters. No obstructing mass or calculi seen.  No findings suspicious for appendicitis. Unremarkable non contrasted appearance of the spleen, pancreas, gallbladder and adrenal glands. Mild lumbar spine degenerative changes. 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  IMPRESSION: 1. Diffuse, concentric inferior rectal wall thickening with irregular luminal narrowing. This is suspicious for a rectal neoplasm. Proctitis is less likely. 2. Mild to moderate bilateral hydronephrosis and hydroureter, most likely due to bladder distention. 3. 2.2 cm left ovarian cyst. This is almost certainly benign, but follow up ultrasound is recommended in 1 year according to the Society of Radiologists in Ultrasound 2010 Consensus Conference Statement (D Lenis Noon et al. Management of Asymptomatic Ovarian and Other Adnexal Cysts Imaged at [place]: Society of Radiologists in Ultrasound Consensus Conference Statement 2010. Radiology 256 (Sept 2010): 943-954.). 4. Right posterior bladder diverticulum or loculated fluid. An ovarian cyst is less likely.   Electronically Signed   By: Beckie Salts M.D.   On: 07/28/2015 15:56   Ct Head Wo Contrast  07/27/2015    CLINICAL DATA:  Weakness and dizziness.  Hypotension.  EXAM: CT HEAD WITHOUT CONTRAST  TECHNIQUE: Contiguous axial images were obtained from the base of the skull through the vertex without intravenous contrast.  COMPARISON:  None.  FINDINGS: Chronic ischemic changes in the periventricular white matter. Mild global atrophy appropriate to age. No mass effect, midline shift, or acute intracranial hemorrhage. Mastoid air cells are clear. Minimal bubbly mucous material in the left sphenoid sinus. Visualized paranasal sinuses are otherwise clear. Cranium is intact.  IMPRESSION: No acute intracranial pathology.  Chronic changes are noted.   Electronically Signed   By: Jolaine Click M.D.   On: 07/27/2015 11:32   Mr Maxine Glenn Head Wo Contrast  07/27/2015   CLINICAL DATA:  Syncope. Found down. Increased weakness the last 2 days. Prior stroke.  EXAM: MRI HEAD WITHOUT CONTRAST  MRA HEAD WITHOUT CONTRAST  TECHNIQUE: Multiplanar, multiecho pulse sequences of the brain and surrounding structures were obtained without intravenous contrast. Angiographic images of the head were obtained using MRA technique without contrast.  COMPARISON:  Head CT 07/27/2015  FINDINGS: MRI HEAD FINDINGS  There is no evidence of acute infarct, mass, midline shift, or extra-axial fluid collection. There is mild generalized cerebral atrophy. Patchy and confluent T2 hyperintensities involving the deep greater than subcortical cerebral white matter bilaterally and pons are nonspecific but compatible with moderate chronic small vessel ischemic disease. There may be 1 or 2 tiny chronic infarcts in the cerebellum.  There is a chronic hemorrhagic infarct involving the body of the right caudate nucleus and internal capsule. Numerous additional T2 hyperintense foci throughout the basal ganglia likely represent dilated perivascular spaces. Chronic lacunar infarcts and microhemorrhages are noted in the thalami.  Prior bilateral cataract extraction is noted. Paranasal  sinuses and mastoid air cells are clear. Major intracranial vascular flow voids are preserved.  MRA HEAD FINDINGS  The visualized distal vertebral arteries are patent with the right being dominant. PICA origins are patent. Right AICA origin is patent. SCA origins are patent. Basilar artery is patent without stenosis. There is a patent left posterior communicating artery. PCAs are patent with mild narrowing of the left P1 segment, although this may be at least partly developmental given the presence of a medium-sized ipsilateral posterior communicating artery. There is mild PCA branch vessel irregularity bilaterally.  Internal carotid arteries are patent from  skullbase to carotid termini without stenosis. Anterior communicating artery is patent. Proximal A1 stenoses are moderate to severe on the right and mild on the left. MCAs are patent with mild-to-moderate branch vessel irregularity but no evidence of significant proximal stenosis. No intracranial aneurysm is identified.  IMPRESSION: 1. No acute intracranial abnormality. 2. Moderate chronic small vessel ischemic disease. 3. 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 4. 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 5. No evidence of major intracranial arterial occlusion. 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   Electronically Signed   By: Sebastian Ache   On: 07/27/2015 11:50   Mr Brain Wo Contrast  07/27/2015   CLINICAL DATA:  Syncope. Found down. Increased weakness the last 2 days. Prior stroke.  EXAM: MRI HEAD WITHOUT CONTRAST  MRA HEAD WITHOUT CONTRAST  TECHNIQUE: Multiplanar, multiecho pulse sequences of the brain and surrounding structures were obtained without intravenous contrast. Angiographic images of the head were obtained using MRA technique without contrast.  COMPARISON:  Head CT 07/27/2015  FINDINGS: MRI HEAD FINDINGS  There is no evidence of acute infarct, mass, midline shift, or extra-axial fluid collection. There is  mild generalized cerebral atrophy. Patchy and confluent T2 hyperintensities involving the deep greater than subcortical cerebral white matter bilaterally and pons are nonspecific but compatible with moderate chronic small vessel ischemic disease. There may be 1 or 2 tiny chronic infarcts in the cerebellum.  There is a chronic hemorrhagic infarct involving the body of the right caudate nucleus and internal capsule. Numerous additional T2 hyperintense foci throughout the basal ganglia likely represent dilated perivascular spaces. Chronic lacunar infarcts and microhemorrhages are noted in the thalami.  Prior bilateral cataract extraction is noted. Paranasal sinuses and mastoid air cells are clear. Major intracranial vascular flow voids are preserved.  MRA HEAD FINDINGS  The visualized distal vertebral arteries are patent with the right being dominant. PICA origins are patent. Right AICA origin is patent. SCA origins are patent. Basilar artery is patent without stenosis. There is a patent left posterior communicating artery. PCAs are patent with mild narrowing of the left P1 segment, although this may be at least partly developmental given the presence of a medium-sized ipsilateral posterior communicating artery. There is mild PCA branch vessel irregularity bilaterally.  Internal carotid arteries are patent from skullbase to carotid termini without stenosis. Anterior communicating artery is patent. Proximal A1 stenoses are moderate to severe on the right and mild on the left. MCAs are patent with mild-to-moderate branch vessel irregularity but no evidence of significant proximal stenosis. No intracranial aneurysm is identified.  IMPRESSION: 1. No acute intracranial abnormality. 2. Moderate chronic small vessel ischemic disease. 3. 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 4. 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 5. No evidence of major intracranial arterial occlusion. Moderate to severe right and mild  left proximal ACA stenoses.   Electronically Signed   By: Sebastian Ache   On: 07/27/2015 11:50   Dg Chest Portable 1 View  07/26/2015   CLINICAL DATA:  Mallet pain for 1 week.  Hypotension today.  EXAM: PORTABLE CHEST - 1 VIEW  COMPARISON:  None.  FINDINGS: The lungs are clear. Heart size is normal. No pneumothorax or pleural effusion.  IMPRESSION: No acute disease.   Electronically Signed   By: Drusilla Kanner M.D.   On: 07/26/2015 19:15    ECG on 07/26/15: 26-Jul-2015 18:39:14 [facility] ROUTINE RECORD Normal sinus rhythm Right atrial enlargement Right axis deviation Anterior infarct ,  age undetermined Abnormal ECG Sinus rhythm Rightward axis QT prolonged Abnormal ekg ... Personally reviewed.  2-D echocardiogram on 07/27/15: - Left ventricle: The cavity size was normal. Wall thickness was increased in a pattern of moderate LVH. Systolic function was vigorous. The estimated ejection fraction was in the range of 65% to 70%. Wall motion was normal; there were no regional wall motion abnormalities. Doppler parameters are consistent with abnormal left ventricular relaxation (grade 1 diastolic dysfunction). The E/e&' ratio is between 8-15, suggesting indeterminate LV filling pressure. - Mitral valve: Calcified annulus. Mildly thickened leaflets . - Left atrium: The atrium was normal in size. - Tricuspid valve: There was trivial regurgitation. - Pulmonary arteries: PA peak pressure: 17 mm Hg (S). - Inferior vena cava: The vessel was normal in size. The respirophasic diameter changes were in the normal range (>= 50%), consistent with normal central venous pressure.  Impressions:  - LVEF 65-70%, moderate LVH, normal wall motion, diastolic dysfunction, indeterminate LV filling pressure, normal LA size. ASSESSMENT AND PLAN  1.  Syncope and collapse secondary to dehydration which was probably secondary to several days of loose stools at home  without adequate oral fluid intake.  She was also on diuretic HCTZ at home. 2.  Hypotension secondary to dehydration and blood pressure meds. 3.  Leukocytosis 4.  Abnormal CT of abdomen raising question of rectal neoplasm.  Further evaluation per primary service. 5.  Elevation of troponins secondary to demand ischemia secondary to prolonged hypotension.  No further ischemic inpatient workup planned at this time but we will get a follow-up EKG.  ST-T wave changes on EKG may be secondary to persistent hypokalemia.  Potassium is being repleted.  Diuretics are on hold.  I will restart her nebivolol in a lower initial dose of 5 mg daily.  Her systolic blood pressure is now high again. Will follow with you.  Karie Schwalbe MD  07/29/2015, 11:09 AM

## 2015-07-30 ENCOUNTER — Encounter (HOSPITAL_COMMUNITY)
Admission: EM | Disposition: A | Discharge status: Home Health | Payer: Self-pay | Source: Home / Self Care | Attending: Internal Medicine

## 2015-07-30 ENCOUNTER — Encounter (HOSPITAL_COMMUNITY): Payer: Self-pay

## 2015-07-30 DIAGNOSIS — N179 Acute kidney failure, unspecified: Principal | ICD-10-CM

## 2015-07-30 DIAGNOSIS — E860 Dehydration: Secondary | ICD-10-CM

## 2015-07-30 DIAGNOSIS — I5032 Chronic diastolic (congestive) heart failure: Secondary | ICD-10-CM

## 2015-07-30 DIAGNOSIS — I10 Essential (primary) hypertension: Secondary | ICD-10-CM

## 2015-07-30 HISTORY — PX: FLEXIBLE SIGMOIDOSCOPY: SHX5431

## 2015-07-30 LAB — CBC
HCT: 33.8 % — ABNORMAL LOW (ref 36.0–46.0)
HEMOGLOBIN: 11.1 g/dL — AB (ref 12.0–15.0)
MCH: 26.4 pg (ref 26.0–34.0)
MCHC: 32.8 g/dL (ref 30.0–36.0)
MCV: 80.5 fL (ref 78.0–100.0)
Platelets: 263 10*3/uL (ref 150–400)
RBC: 4.2 MIL/uL (ref 3.87–5.11)
RDW: 13.7 % (ref 11.5–15.5)
WBC: 10.2 10*3/uL (ref 4.0–10.5)

## 2015-07-30 LAB — GLUCOSE, CAPILLARY
GLUCOSE-CAPILLARY: 149 mg/dL — AB (ref 65–99)
GLUCOSE-CAPILLARY: 197 mg/dL — AB (ref 65–99)
GLUCOSE-CAPILLARY: 331 mg/dL — AB (ref 65–99)
GLUCOSE-CAPILLARY: 94 mg/dL (ref 65–99)
Glucose-Capillary: 60 mg/dL — ABNORMAL LOW (ref 65–99)
Glucose-Capillary: 129 mg/dL — ABNORMAL HIGH (ref 65–99)
Glucose-Capillary: 225 mg/dL — ABNORMAL HIGH (ref 65–99)

## 2015-07-30 LAB — BASIC METABOLIC PANEL
Anion gap: 6 (ref 5–15)
BUN: 17 mg/dL (ref 6–20)
CALCIUM: 8.6 mg/dL — AB (ref 8.9–10.3)
CHLORIDE: 109 mmol/L (ref 101–111)
CO2: 28 mmol/L (ref 22–32)
Creatinine, Ser: 0.8 mg/dL (ref 0.44–1.00)
Glucose, Bld: 53 mg/dL — ABNORMAL LOW (ref 65–99)
Potassium: 3.5 mmol/L (ref 3.5–5.1)
Sodium: 143 mmol/L (ref 135–145)

## 2015-07-30 LAB — MAGNESIUM: MAGNESIUM: 1.9 mg/dL (ref 1.7–2.4)

## 2015-07-30 SURGERY — SIGMOIDOSCOPY, FLEXIBLE
Anesthesia: Moderate Sedation

## 2015-07-30 NOTE — Progress Notes (Signed)
Pt up in chair, needs one person assist and minimal coaching, verbalizes anxiety about falling, denies any dizziness upon sitting or standing, Raymon Mutton RN

## 2015-07-30 NOTE — Progress Notes (Signed)
4 am CBG 60. Patient was asymptomatic. Patient given orange juice. Rechecked CBG and it was 94.

## 2015-07-30 NOTE — Op Note (Signed)
[facility] [address] [place], [address]   FLEXIBLE SIGMOIDOSCOPY PROCEDURE REPORT  PATIENT: Dana, Blair  MR#: [id number] BIRTHDATE: [birth year] , 74  yrs. old GENDER: female ENDOSCOPIST: Dorena Cookey, MD REFERRED BY: PROCEDURE DATE:  07/30/2015 PROCEDURE: ASA CLASS: INDICATIONS:rectal thickening on CT MEDICATIONS: no sedation  DESCRIPTION OF PROCEDURE:   After the risks benefits and alternatives of the procedure were thoroughly explained, informed consent was obtained.  digital exam was performed with no abnormality      The Pentax colonoscope       endoscope was introduced through the anus  and advanced to the distal descending colon      , The exam was Without limitations.    The quality of the prep was fair      . Estimated blood loss is zero unless otherwise noted in this procedure report. The instrument was then slowly withdrawn as the mucosa was fully examined.       in the distal rectum from about 4-6 cm from the anus there was a 2-3 cm ulcerated inflamed area with significant adherent exudate. No obvious visible mass or adenoma was appreciated. There was no definit or well-defined ulcer. Multiple biopsies were taken. retroflex view the anus was unremarkable         The scope was then withdrawn from the patient and the procedure terminated.  COMPLICATIONS: There were no immediate complications.  ENDOSCOPIC IMPRESSION: vague ulcerated/inflamed focal abnormality of the distal rectum cannot rule out neoplasm but not obviously neoplastic visibly could be a stercoral ulcer  RECOMMENDATIONS: await biopsy results.  REPEAT EXAM:  eSigned:  Dorena Cookey, MD 07/30/2015 10:32 AM   CC:

## 2015-07-30 NOTE — Progress Notes (Signed)
PATIENT DETAILS Name: Dana Blair Age: 74 y.o. Sex: female Date of Birth: 1941/09/17 Admit Date: 07/26/2015 Admitting Physician Hillary Bow, DO PCP:SUN,VYVYAN Y, MD  Subjective: Denies shortness of breath. Apparently not dizzy when she stood up with nursing yesterday.  Assessment/Plan: Principal Problem: Syncope and collapse: Likely secondary to orthostatic hypotension. Treated with IV fluids, now off on IV fluids-recheck orthostatics, place TED hose.   Active Problems: Acute renal failure: Resolved. Etiology felt to be multifactorial-from dehydration/diarrhea/lisinopril and HCTZ use. Follow electrolytes periodically.  Leukocytosis: Has persistent leukocytosis, but afebrile. Chronically sick appearing. UA/chest x-ray negative for UTI/pneumonia. Blood cultures negative, urine culture on 7/26 positive for 50,000 colonies of Escherichia coli-given bilateral hydronephrosis-will cover with IV Rocephin-although UA on same date negative. CT abdomen shows distended bladder with bilateral hydronephrosis and rectal thickening.   Urinary retention: Place Foley catheter. Voiding trial over the next few days.  Rectal thickening seen on CT scan abdomen: GI consultation placed. Given weight loss-high suspicion for malignancy. Underwent flexible sigmoidoscopy on 7/30 which showed ulceration/inflammation in the distal rectum-await biopsy results.  Chronic diastolic heart failure: Clinically compensated. Continue Bystolic cautiously  Hypokalemia: Suspect secondary to GI loss. Repleted, recheck electrolytes periodically.  Mildly elevated troponins: Not in the pattern of ACS-likely demand ischemia-however given ST changes and EKG -consulted cardiology. 2-D echocardiogram shows no regional wall motion abnormality.  Essential hypertension: Although still orthostatic-BP no significantly high-stop IV fluids, cautiously started on Bystolic. Follow.  Type 2 diabetes: CBGs controlled  with SSI. Resume oral hypoglycemics on discharge  Dyslipidemia: Hold Vytorin for now.  Severe malnutrition: Continue supplements  Disposition: Remain inpatient-home with home health services in the next 2-3 days once workup is complete  Antimicrobial agents  See below  Anti-infectives    Start     Dose/Rate Route Frequency Ordered Stop   07/29/15 1900  cefTRIAXone (ROCEPHIN) 1 g in dextrose 5 % 50 mL IVPB - Premix     1 g 100 mL/hr over 30 Minutes Intravenous Every 24 hours 07/29/15 1828     07/28/15 1000  vancomycin (VANCOCIN) IVPB 750 mg/150 ml premix  Status:  Discontinued     750 mg 150 mL/hr over 60 Minutes Intravenous Every 24 hours 07/27/15 0915 07/28/15 0901   07/27/15 1800  piperacillin-tazobactam (ZOSYN) IVPB 3.375 g  Status:  Discontinued     3.375 g 12.5 mL/hr over 240 Minutes Intravenous Every 8 hours 07/27/15 0915 07/28/15 0901   07/27/15 0915  vancomycin (VANCOCIN) IVPB 1000 mg/200 mL premix  Status:  Discontinued     1,000 mg 200 mL/hr over 60 Minutes Intravenous  Once 07/27/15 0913 07/29/15 0917   07/27/15 0915  piperacillin-tazobactam (ZOSYN) IVPB 3.375 g     3.375 g 100 mL/hr over 30 Minutes Intravenous  Once 07/27/15 0913 07/27/15 1200      DVT Prophylaxis: SCD's  Code Status: Full code  Family Communication Daughter at bedside  Procedures: None  CONSULTS:  cardiology and GI  Time spent 30 minutes-Greater than 50% of this time was spent in counseling, explanation of diagnosis, planning of further management, and coordination of care  MEDICATIONS: Scheduled Meds: . aspirin EC  81 mg Oral Daily  . cefTRIAXone (ROCEPHIN)  IV  1 g Intravenous Q24H  . feeding supplement (ENSURE ENLIVE)  237 mL Oral TID BM  . insulin aspart  0-9 Units Subcutaneous 6 times per day  . nebivolol  5 mg Oral Daily  Continuous Infusions:  PRN Meds:.    PHYSICAL EXAM: Vital signs in last 24 hours: Filed Vitals:   07/30/15 1015 07/30/15 1020 07/30/15 1025  07/30/15 1230  BP: 157/58 156/56 138/54 112/40  Pulse:    93  Temp:    97.7 F (36.5 C)  TempSrc:    Oral  Resp:    18  Height:      Weight:      SpO2: 99% 98% 100% 96%    Weight change: 1.118 kg (2 lb 7.4 oz) Filed Weights   07/28/15 0455 07/29/15 0351 07/30/15 0536  Weight: 54.885 kg (121 lb) 50.122 kg (110 lb 8 oz) 51.24 kg (112 lb 15.4 oz)   Body mass index is 19.38 kg/(m^2).   Gen Exam: Awake and alert with clear speech. Chronically sick appearing Neck: Supple, No JVD.   Chest: B/L Clear.   CVS: S1 S2 Regular, no murmurs.  Abdomen: soft, BS +, non tender, bladder palpable. Extremities: no edema, lower extremities warm to touch. Neurologic: Non Focal.   Skin: No Rash.   Wounds: N/A.   Intake/Output from previous day:  Intake/Output Summary (Last 24 hours) at 07/30/15 1334 Last data filed at 07/30/15 0650  Gross per 24 hour  Intake    462 ml  Output   2450 ml  Net  -1988 ml     LAB RESULTS: CBC  Recent Labs Lab 07/27/15 0320 07/27/15 1336 07/28/15 0430 07/29/15 0338 07/30/15 0311  WBC 18.7* 21.0* 21.6* 18.6* 10.2  HGB 17.3* 12.6 14.9 13.6 11.1*  HCT 54.1* 37.8 42.7 40.7 33.8*  PLT 159 317 PLATELET CLUMPS NOTED ON SMEAR, COUNT APPEARS DECREASED 310 263  MCV 83.1 82.4 79.7 81.1 80.5  MCH 26.6 27.5 27.8 27.1 26.4  MCHC 32.0 33.3 34.9 33.4 32.8  RDW 13.6 13.7 13.6 13.6 13.7  LYMPHSABS  --  2.1  --   --   --   MONOABS  --  1.2*  --   --   --   EOSABS  --  0.0  --   --   --   BASOSABS  --  0.0  --   --   --     Chemistries   Recent Labs Lab 07/27/15 0320 07/28/15 0855 07/29/15 0338 07/29/15 1400 07/30/15 0311  NA 145 145 141 141 143  K 3.7 3.1* 2.5* 2.9* 3.5  CL 113* 109 107 107 109  CO2 20* 20* GLUCOSE 162* 174* 148* 405* 53*  BUN 41* 22* CREATININE 1.47* 1.35* 0.96 1.01* 0.80  CALCIUM 8.3* 8.8* 8.7* 8.4* 8.6*  MG  --  1.7 1.5*  --  1.9    CBG:  Recent Labs Lab 07/30/15 0006 07/30/15 0410 07/30/15 0444  07/30/15 0724 07/30/15 1158  GLUCAP 149* 60* 94 197* 129*    GFR Estimated Creatinine Clearance: 49.9 mL/min (by C-G formula based on Cr of 0.8).  Coagulation profile No results for input(s): INR, PROTIME in the last 168 hours.  Cardiac Enzymes  Recent Labs Lab 07/27/15 0859 07/27/15 1337 07/27/15 2130  TROPONINI 0.23* 0.24* 0.20*    Invalid input(s): POCBNP No results for input(s): DDIMER in the last 72 hours.  Recent Labs  07/27/15 2130  HGBA1C 6.3*    Recent Labs  07/27/15 2130  CHOL 119  HDL 45  LDLCALC 52  TRIG 111  CHOLHDL 2.6   No results for input(s): TSH, T4TOTAL, T3FREE, THYROIDAB in the last 72 hours.  Invalid  input(s): FREET3 No results for input(s): VITAMINB12, FOLATE, FERRITIN, TIBC, IRON, RETICCTPCT in the last 72 hours. No results for input(s): LIPASE, AMYLASE in the last 72 hours.  Urine Studies No results for input(s): UHGB, CRYS in the last 72 hours.  Invalid input(s): UACOL, UAPR, USPG, UPH, UTP, UGL, UKET, UBIL, UNIT, UROB, ULEU, UEPI, UWBC, URBC, UBAC, CAST, UCOM, BILUA  MICROBIOLOGY: Recent Results (from the past 240 hour(s))  Blood culture (routine x 2)     Status: None (Preliminary result)   Collection Time: 07/26/15  6:58 PM  Result Value Ref Range Status   Specimen Description BLOOD LEFT ANTECUBITAL  Final   Special Requests BOTTLES DRAWN AEROBIC AND ANAEROBIC 2CC  Final   Culture NO GROWTH 4 DAYS  Final   Report Status PENDING  Incomplete  Blood culture (routine x 2)     Status: None (Preliminary result)   Collection Time: 07/26/15  7:43 PM  Result Value Ref Range Status   Specimen Description BLOOD LEFT HAND  Final   Special Requests BOTTLES DRAWN AEROBIC ONLY 3CC  Final   Culture NO GROWTH 4 DAYS  Final   Report Status PENDING  Incomplete  Urine culture     Status: None   Collection Time: 07/26/15  8:23 PM  Result Value Ref Range Status   Specimen Description URINE, CATHETERIZED  Final   Special Requests NONE  Final     Culture 50,000 COLONIES/mL ESCHERICHIA COLI  Final   Report Status 07/29/2015 FINAL  Final   Organism ID, Bacteria ESCHERICHIA COLI  Final      Susceptibility   Escherichia coli - MIC*    AMPICILLIN <=2 SENSITIVE Sensitive     CEFAZOLIN <=4 SENSITIVE Sensitive     CEFTRIAXONE <=1 SENSITIVE Sensitive     CIPROFLOXACIN <=0.25 SENSITIVE Sensitive     GENTAMICIN <=1 SENSITIVE Sensitive     IMIPENEM <=0.25 SENSITIVE Sensitive     NITROFURANTOIN <=16 SENSITIVE Sensitive     TRIMETH/SULFA <=20 SENSITIVE Sensitive     AMPICILLIN/SULBACTAM <=2 SENSITIVE Sensitive     PIP/TAZO <=4 SENSITIVE Sensitive     * 50,000 COLONIES/mL ESCHERICHIA COLI  Clostridium Difficile by PCR (not at [facility])     Status: None   Collection Time: 07/28/15  2:56 PM  Result Value Ref Range Status   C difficile by pcr NEGATIVE NEGATIVE Final    RADIOLOGY STUDIES/RESULTS: Ct Abdomen Pelvis Wo Contrast  07/28/2015   CLINICAL DATA:  Found unresponsive on the floor.  EXAM: CT ABDOMEN AND PELVIS WITHOUT CONTRAST  TECHNIQUE: Multidetector CT imaging of the abdomen and pelvis was performed following the standard protocol without IV contrast.  COMPARISON:  None.  FINDINGS: Atheromatous calcifications, including the coronary arteries. Small amount of linear atelectasis or scarring at both lung bases. Small liver cysts. Distended urinary bladder. Right posterior bladder diverticulum or loculated free peritoneal fluid. Left ovarian coarse calcification and 2.2 cm cyst. Surgically absent uterus.  Concentric inferior rectal wall thickening with irregular luminal narrowing. No bowel dilatation. No enlarged lymph nodes. Mild to moderate dilatation of both renal collecting systems, or proximal ureters and mid ureters. No obstructing mass or calculi seen.  No findings suspicious for appendicitis. Unremarkable non contrasted appearance of the spleen, pancreas, gallbladder and adrenal glands. Mild lumbar spine degenerative changes. 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  IMPRESSION: 1. Diffuse, concentric inferior rectal wall thickening with irregular luminal narrowing. This is suspicious for a rectal neoplasm. Proctitis is less likely. 2.  Mild to moderate bilateral hydronephrosis and hydroureter, most likely due to bladder distention. 3. 2.2 cm left ovarian cyst. This is almost certainly benign, but follow up ultrasound is recommended in 1 year according to the Society of Radiologists in Ultrasound 2010 Consensus Conference Statement (D Lenis Noon et al. Management of Asymptomatic Ovarian and Other Adnexal Cysts Imaged at [place]: Society of Radiologists in Ultrasound Consensus Conference Statement 2010. Radiology 256 (Sept 2010): 943-954.). 4. Right posterior bladder diverticulum or loculated fluid. An ovarian cyst is less likely.   Electronically Signed   By: Beckie Salts M.D.   On: 07/28/2015 15:56   Ct Head Wo Contrast  07/27/2015   CLINICAL DATA:  Weakness and dizziness.  Hypotension.  EXAM: CT HEAD WITHOUT CONTRAST  TECHNIQUE: Contiguous axial images were obtained from the base of the skull through the vertex without intravenous contrast.  COMPARISON:  None.  FINDINGS: Chronic ischemic changes in the periventricular white matter. Mild global atrophy appropriate to age. No mass effect, midline shift, or acute intracranial hemorrhage. Mastoid air cells are clear. Minimal bubbly mucous material in the left sphenoid sinus. Visualized paranasal sinuses are otherwise clear. Cranium is intact.  IMPRESSION: No acute intracranial pathology.  Chronic changes are noted.   Electronically Signed   By: Jolaine Click M.D.   On: 07/27/2015 11:32   Mr Maxine Glenn Head Wo Contrast  07/27/2015   CLINICAL DATA:  Syncope. Found down. Increased weakness the last 2 days. Prior stroke.  EXAM: MRI HEAD WITHOUT CONTRAST  MRA HEAD WITHOUT CONTRAST  TECHNIQUE: Multiplanar, multiecho pulse sequences of the brain and surrounding structures were obtained  without intravenous contrast. Angiographic images of the head were obtained using MRA technique without contrast.  COMPARISON:  Head CT 07/27/2015  FINDINGS: MRI HEAD FINDINGS  There is no evidence of acute infarct, mass, midline shift, or extra-axial fluid collection. There is mild generalized cerebral atrophy. Patchy and confluent T2 hyperintensities involving the deep greater than subcortical cerebral white matter bilaterally and pons are nonspecific but compatible with moderate chronic small vessel ischemic disease. There may be 1 or 2 tiny chronic infarcts in the cerebellum.  There is a chronic hemorrhagic infarct involving the body of the right caudate nucleus and internal capsule. Numerous additional T2 hyperintense foci throughout the basal ganglia likely represent dilated perivascular spaces. Chronic lacunar infarcts and microhemorrhages are noted in the thalami.  Prior bilateral cataract extraction is noted. Paranasal sinuses and mastoid air cells are clear. Major intracranial vascular flow voids are preserved.  MRA HEAD FINDINGS  The visualized distal vertebral arteries are patent with the right being dominant. PICA origins are patent. Right AICA origin is patent. SCA origins are patent. Basilar artery is patent without stenosis. There is a patent left posterior communicating artery. PCAs are patent with mild narrowing of the left P1 segment, although this may be at least partly developmental given the presence of a medium-sized ipsilateral posterior communicating artery. There is mild PCA branch vessel irregularity bilaterally.  Internal carotid arteries are patent from skullbase to carotid termini without stenosis. Anterior communicating artery is patent. Proximal A1 stenoses are moderate to severe on the right and mild on the left. MCAs are patent with mild-to-moderate branch vessel irregularity but no evidence of significant proximal stenosis. No intracranial aneurysm is identified.  IMPRESSION: 1. No  acute intracranial abnormality. 2. Moderate chronic small vessel ischemic disease. 3. 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 4. 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 5. No evidence of major intracranial arterial occlusion.  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   Electronically Signed   By: Sebastian Ache   On: 07/27/2015 11:50   Mr Brain Wo Contrast  07/27/2015   CLINICAL DATA:  Syncope. Found down. Increased weakness the last 2 days. Prior stroke.  EXAM: MRI HEAD WITHOUT CONTRAST  MRA HEAD WITHOUT CONTRAST  TECHNIQUE: Multiplanar, multiecho pulse sequences of the brain and surrounding structures were obtained without intravenous contrast. Angiographic images of the head were obtained using MRA technique without contrast.  COMPARISON:  Head CT 07/27/2015  FINDINGS: MRI HEAD FINDINGS  There is no evidence of acute infarct, mass, midline shift, or extra-axial fluid collection. There is mild generalized cerebral atrophy. Patchy and confluent T2 hyperintensities involving the deep greater than subcortical cerebral white matter bilaterally and pons are nonspecific but compatible with moderate chronic small vessel ischemic disease. There may be 1 or 2 tiny chronic infarcts in the cerebellum.  There is a chronic hemorrhagic infarct involving the body of the right caudate nucleus and internal capsule. Numerous additional T2 hyperintense foci throughout the basal ganglia likely represent dilated perivascular spaces. Chronic lacunar infarcts and microhemorrhages are noted in the thalami.  Prior bilateral cataract extraction is noted. Paranasal sinuses and mastoid air cells are clear. Major intracranial vascular flow voids are preserved.  MRA HEAD FINDINGS  The visualized distal vertebral arteries are patent with the right being dominant. PICA origins are patent. Right AICA origin is patent. SCA origins are patent. Basilar artery is patent without stenosis. There is a patent left  posterior communicating artery. PCAs are patent with mild narrowing of the left P1 segment, although this may be at least partly developmental given the presence of a medium-sized ipsilateral posterior communicating artery. There is mild PCA branch vessel irregularity bilaterally.  Internal carotid arteries are patent from skullbase to carotid termini without stenosis. Anterior communicating artery is patent. Proximal A1 stenoses are moderate to severe on the right and mild on the left. MCAs are patent with mild-to-moderate branch vessel irregularity but no evidence of significant proximal stenosis. No intracranial aneurysm is identified.  IMPRESSION: 1. No acute intracranial abnormality. 2. Moderate chronic small vessel ischemic disease. 3. 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 4. 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 5. No evidence of major intracranial arterial occlusion. 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   Electronically Signed   By: Sebastian Ache   On: 07/27/2015 11:50   Dg Chest Portable 1 View  07/26/2015   CLINICAL DATA:  Mallet pain for 1 week.  Hypotension today.  EXAM: PORTABLE CHEST - 1 VIEW  COMPARISON:  None.  FINDINGS: The lungs are clear. Heart size is normal. No pneumothorax or pleural effusion.  IMPRESSION: No acute disease.   Electronically Signed   By: Drusilla Kanner M.D.   On: 07/26/2015 19:15    Jeoffrey Massed, MD  [facility] Pager:[phone]  If 7PM-7AM, please contact night-coverage [email or web address] Password [email or web address] 07/30/2015, 1:34 PM   LOS: 4 days

## 2015-07-30 NOTE — Progress Notes (Signed)
Patient Name: Dana Blair Date of Encounter: 07/30/2015  Principal Problem:   Syncope and collapse Active Problems:   Essential hypertension   DM2 (diabetes mellitus, type 2)   AKI (acute kidney injury)   Dehydration   Occult blood positive stool   Chronic diastolic congestive heart failure   LVH (left ventricular hypertrophy)   Orthostatic hypotension   Lactic acidosis   Severe protein-calorie malnutrition   Length of Stay: 4  SUBJECTIVE  The patient denies CP or SOB, she feels generally unwell, not able to specify any particular complain, started to walk with PT yesterday.   CURRENT MEDS . aspirin EC  81 mg Oral Daily  . cefTRIAXone (ROCEPHIN)  IV  1 g Intravenous Q24H  . feeding supplement (ENSURE ENLIVE)  237 mL Oral TID BM  . insulin aspart  0-9 Units Subcutaneous 6 times per day  . nebivolol  5 mg Oral Daily    OBJECTIVE  Filed Vitals:   07/29/15 0800 07/29/15 1721 07/29/15 2005 07/30/15 0536  BP: 169/69 159/72 154/68 138/63  Pulse: 70 70 96 60  Temp: 98.7 F (37.1 C) 98 F (36.7 C) 98.2 F (36.8 C) 98.8 F (37.1 C)  TempSrc: Oral Oral Oral Oral  Resp: Height:      Weight:    112 lb 15.4 oz (51.24 kg)  SpO2: 100% 98% 100% 99%    Intake/Output Summary (Last 24 hours) at 07/30/15 0929 Last data filed at 07/30/15 0650  Gross per 24 hour  Intake    462 ml  Output   3450 ml  Net  -2988 ml   Filed Weights   07/28/15 0455 07/29/15 0351 07/30/15 0536  Weight: 121 lb (54.885 kg) 110 lb 8 oz (50.122 kg) 112 lb 15.4 oz (51.24 kg)   PHYSICAL EXAM  General: Pleasant, NAD. Neuro: Alert and oriented X 3. Moves all extremities spontaneously. Psych: Normal affect. HEENT:  Normal  Neck: Supple without bruits or JVD. Lungs:  Resp regular and unlabored, CTA. Heart: RRR no s3, s4, or murmurs. Abdomen: Soft, non-tender, non-distended, BS + x 4.  Extremities: No clubbing, cyanosis or edema. DP/PT/Radials 2+ and equal  bilaterally.  Accessory Clinical Findings  CBC  Recent Labs  07/27/15 1336  07/29/15 0338 07/30/15 0311  WBC 21.0*  < > 18.6* 10.2  NEUTROABS 17.7*  --   --   --   HGB 12.6  < > 13.6 11.1*  HCT 37.8  < > 40.7 33.8*  MCV 82.4  < > 81.1 80.5  PLT 317  < > 310 263  < > = values in this interval not displayed. Basic Metabolic Panel  Recent Labs  07/29/15 0338 07/29/15 1400 07/30/15 0311  NA 141 141 143  K 2.5* 2.9* 3.5  CL 107 107 109  CO2 GLUCOSE 148* 405* 53*  BUN CREATININE 0.96 1.01* 0.80  CALCIUM 8.7* 8.4* 8.6*  MG 1.5*  --  1.9   Liver Function Tests  Recent Labs  07/28/15 0855  AST 100*  ALT 152*  ALKPHOS 74  BILITOT 1.2  PROT 7.5  ALBUMIN 2.9*   No results for input(s): LIPASE, AMYLASE in the last 72 hours. Cardiac Enzymes  Recent Labs  07/27/15 1337 07/27/15 2130  TROPONINI 0.24* 0.20*    Recent Labs  07/27/15 2130  HGBA1C 6.3*   Fasting Lipid Panel  Recent Labs  07/27/15 2130  CHOL 119  HDL  45  LDLCALC 52  TRIG 111  CHOLHDL 2.6   Radiology/Studies  Ct Abdomen Pelvis Wo Contrast  07/28/2015   CLINICAL DATA:  Found unresponsive on the floor.  EXAM: CT ABDOMEN AND PELVIS WITHOUT CONTRAST  TECHNIQUE: Multidetector CT imaging of the abdomen and pelvis was performed following the standard protocol without IV contrast.  COMPARISON:  None.  FINDINGS: Atheromatous calcifications, including the coronary arteries. Small amount of linear atelectasis or scarring at both lung bases. Small liver cysts. Distended urinary bladder. Right posterior bladder diverticulum or loculated free peritoneal fluid. Left ovarian coarse calcification and 2.2 cm cyst. Surgically absent uterus.  Concentric inferior rectal wall thickening with irregular luminal narrowing. No bowel dilatation. No enlarged lymph nodes. Mild to moderate dilatation of both renal collecting systems, or proximal ureters and mid ureters. No obstructing mass or calculi seen.   No findings suspicious for appendicitis. Unremarkable non contrasted appearance of the spleen, pancreas, gallbladder and adrenal glands. Mild lumbar spine degenerative changes. 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  IMPRESSION: 1. Diffuse, concentric inferior rectal wall thickening with irregular luminal narrowing. This is suspicious for a rectal neoplasm. Proctitis is less likely. 2. Mild to moderate bilateral hydronephrosis and hydroureter, most likely due to bladder distention. 3. 2.2 cm left ovarian cyst. This is almost certainly benign, but follow up ultrasound is recommended in 1 year according to the Society of Radiologists in Ultrasound 2010 Consensus Conference Statement (D Lenis Noon et al. Management of Asymptomatic Ovarian and Other Adnexal Cysts Imaged at [place]: Society of Radiologists in Ultrasound Consensus Conference Statement 2010. Radiology 256 (Sept 2010): 943-954.). 4. Right posterior bladder diverticulum or loculated fluid. An ovarian cyst is less likely.   Electronically Signed   By: Beckie Salts M.D.   On: 07/28/2015 15:56   Ct Head Wo Contrast  07/27/2015   CLINICAL DATA:  Weakness and dizziness.  Hypotension.  EXAM: CT HEAD WITHOUT CONTRAST  TECHNIQUE: Contiguous axial images were obtained from the base of the skull through the vertex without intravenous contrast.  COMPARISON:  None.  FINDINGS: Chronic ischemic changes in the periventricular white matter. Mild global atrophy appropriate to age. No mass effect, midline shift, or acute intracranial hemorrhage. Mastoid air cells are clear. Minimal bubbly mucous material in the left sphenoid sinus. Visualized paranasal sinuses are otherwise clear. Cranium is intact.  IMPRESSION: No acute intracranial pathology.  Chronic changes are noted.   Electronically Signed   By: Jolaine Click M.D.   On: 07/27/2015 11:32   Mr Brain Wo Contrast  07/27/2015   CLINICAL DATA:  Syncope. Found down. Increased weakness the last 2  days. Prior stroke.    IMPRESSION: 1. No acute intracranial abnormality. 2. Moderate chronic small vessel ischemic disease. 3. 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 4. 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 5. No evidence of major intracranial arterial occlusion. 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    Dg Chest Portable 1 View  07/26/2015   CLINICAL DATA:  Mallet pain for 1 week.  Hypotension today.  EXAM: PORTABLE CHEST - 1 VIEW  COMPARISON:  None.  FINDINGS: The lungs are clear. Heart size is normal. No pneumothorax or pleural effusion.  IMPRESSION: No acute disease.   Electronically Signed   By: Drusilla Kanner M.D.   On: 07/26/2015 19:15   TELE: SB  2-D echocardiogram on 07/27/15: - Left ventricle: The cavity size was normal. Wall thickness was increased in a pattern of  moderate LVH. Systolic function was vigorous. The estimated ejection fraction was in the range of 65% to 70%. Wall motion was normal; there were no regional wall motion abnormalities. Doppler parameters are consistent with abnormal left ventricular relaxation (grade 1 diastolic dysfunction). The E/e&' ratio is between 8-15, suggesting indeterminate LV filling pressure. - Mitral valve: Calcified annulus. Mildly thickened leaflets . - Left atrium: The atrium was normal in size. - Tricuspid valve: There was trivial regurgitation. - Pulmonary arteries: PA peak pressure: 17 mm Hg (S). - Inferior vena cava: The vessel was normal in size. The respirophasic diameter changes were in the normal range (>= 50%), consistent with normal central venous pressure.  Impressions:  - LVEF 65-70%, moderate LVH, normal wall motion, diastolic dysfunction, indeterminate LV filling pressure, normal LA size.  ASSESSMENT AND PLAN  74 year old woman found down at home by her son on 07/26/15. She has a past history of hypertension and mild valvular heart disease. No history of  ischemic heart disease.  1. Syncope and collapse secondary to dehydration which was probably secondary to several days of loose stools at home without adequate oral fluid intake. She was also on diuretic HCTZ at home. 2. Hypotension secondary to dehydration and blood pressure meds. 3. Leukocytosis 4. Abnormal CT of abdomen raising question of rectal neoplasm. Further evaluation per primary service. 5. Elevation of troponins secondary to demand ischemia secondary to prolonged hypotension. No further ischemic inpatient workup planned at this time but we will get a follow-up EKG. ST-T wave changes on EKG may be secondary to persistent hypokalemia. Potassium is being repleted. Diuretics are on hold. 6. Hypertension - started on nebivolol in a lower initial dose of 5 mg daily, BP controlled today.    Signed, Lars Masson MD, [facility] 07/30/2015

## 2015-07-31 LAB — GLUCOSE, CAPILLARY
GLUCOSE-CAPILLARY: 235 mg/dL — AB (ref 65–99)
Glucose-Capillary: 170 mg/dL — ABNORMAL HIGH (ref 65–99)
Glucose-Capillary: 123 mg/dL — ABNORMAL HIGH (ref 65–99)
Glucose-Capillary: 183 mg/dL — ABNORMAL HIGH (ref 65–99)
Glucose-Capillary: 296 mg/dL — ABNORMAL HIGH (ref 65–99)
Glucose-Capillary: 215 mg/dL — ABNORMAL HIGH (ref 65–99)

## 2015-07-31 LAB — CULTURE, BLOOD (ROUTINE X 2)
CULTURE: NO GROWTH
Culture: NO GROWTH

## 2015-07-31 LAB — BASIC METABOLIC PANEL
Anion gap: 8 (ref 5–15)
BUN: 24 mg/dL — AB (ref 6–20)
CO2: 27 mmol/L (ref 22–32)
Calcium: 8.4 mg/dL — ABNORMAL LOW (ref 8.9–10.3)
Chloride: 105 mmol/L (ref 101–111)
Creatinine, Ser: 0.71 mg/dL (ref 0.44–1.00)
GFR calc Af Amer: >60 mL/min (ref >60)
Glucose, Bld: 178 mg/dL — ABNORMAL HIGH (ref 65–99)
POTASSIUM: 3.9 mmol/L (ref 3.5–5.1)
Sodium: 140 mmol/L (ref 135–145)

## 2015-07-31 LAB — MAGNESIUM: Magnesium: 1.7 mg/dL (ref 1.7–2.4)

## 2015-07-31 NOTE — Progress Notes (Signed)
PATIENT DETAILS Name: Dana Blair Age: 74 y.o. Sex: female Date of Birth: [birth year] Admit Date: 07/26/2015 Admitting Physician Hillary Bow, DO PCP:SUN,VYVYAN Y, MD  Subjective: Feels much better. Orthostatic vital signs have resolved.   Assessment/Plan: Principal Problem: Syncope and collapse: Likely secondary to orthostatic hypotension. Treated with IV fluids, with resolution of orthostatics. Continue TED hose.   Active Problems: Acute renal failure: Resolved. Etiology felt to be multifactorial-from dehydration/diarrhea/lisinopril and HCTZ use. Follow electrolytes periodically.  Leukocytosis: Has persistent leukocytosis, but afebrile. Chronically sick appearing. UA/chest x-ray negative for UTI/pneumonia. Blood cultures negative, urine culture on 7/26 positive for 50,000 colonies of Escherichia coli-given bilateral hydronephrosis-covered IV Rocephin-although UA on same date negative. CT abdomen shows distended bladder with bilateral hydronephrosis and rectal thickening.   Urinary retention: Placed Foley catheter. Voiding trial today  Rectal thickening seen on CT scan abdomen: GI consultation placed. Given weight loss-high suspicion for malignancy. Underwent flexible sigmoidoscopy on 7/30 which showed ulceration/inflammation in the distal rectum-await biopsy results.  Chronic diastolic heart failure: Clinically compensated. Continue Bystolic cautiously  Hypokalemia: Suspect secondary to GI loss. Repleted, recheck electrolytes periodically.  Mildly elevated troponins: Not in the pattern of ACS-likely demand ischemia-however given ST changes and EKG -consulted cardiology. 2-D echocardiogram shows no regional wall motion abnormality.  Essential hypertension: Controlled with Bystolic. Follow.  Type 2 diabetes: CBGs controlled with SSI. Resume oral hypoglycemics on discharge  Dyslipidemia: Hold Vytorin for now.  Severe malnutrition: Continue  supplements  Disposition: Remain inpatient-home with home health services in the next 2-3 days once workup is complete  Antimicrobial agents  See below  Anti-infectives    Start     Dose/Rate Route Frequency Ordered Stop   07/29/15 1900  cefTRIAXone (ROCEPHIN) 1 g in dextrose 5 % 50 mL IVPB - Premix     1 g 100 mL/hr over 30 Minutes Intravenous Every 24 hours 07/29/15 1828     07/28/15 1000  vancomycin (VANCOCIN) IVPB 750 mg/150 ml premix  Status:  Discontinued     750 mg 150 mL/hr over 60 Minutes Intravenous Every 24 hours 07/27/15 0915 07/28/15 0901   07/27/15 1800  piperacillin-tazobactam (ZOSYN) IVPB 3.375 g  Status:  Discontinued     3.375 g 12.5 mL/hr over 240 Minutes Intravenous Every 8 hours 07/27/15 0915 07/28/15 0901   07/27/15 0915  vancomycin (VANCOCIN) IVPB 1000 mg/200 mL premix  Status:  Discontinued     1,000 mg 200 mL/hr over 60 Minutes Intravenous  Once 07/27/15 0913 07/29/15 0917   07/27/15 0915  piperacillin-tazobactam (ZOSYN) IVPB 3.375 g     3.375 g 100 mL/hr over 30 Minutes Intravenous  Once 07/27/15 0913 07/27/15 1200      DVT Prophylaxis: SCD's  Code Status: Full code  Family Communication Daughter at bedside  Procedures: None  CONSULTS:  cardiology and GI  Time spent 25 minutes-Greater than 50% of this time was spent in counseling, explanation of diagnosis, planning of further management, and coordination of care  MEDICATIONS: Scheduled Meds: . aspirin EC  81 mg Oral Daily  . cefTRIAXone (ROCEPHIN)  IV  1 g Intravenous Q24H  . feeding supplement (ENSURE ENLIVE)  237 mL Oral TID BM  . insulin aspart  0-9 Units Subcutaneous 6 times per day  . nebivolol  5 mg Oral Daily   Continuous Infusions:  PRN Meds:.    PHYSICAL EXAM: Vital signs in last 24 hours: Filed Vitals:  07/30/15 1230 07/30/15 1700 07/30/15 1900 07/31/15 0500  BP: 112/40 127/65 134/64 134/67  Pulse: 93 72 72 70  Temp: 97.7 F (36.5 C) 98 F (36.7 C) 98.5 F (36.9  C) 98.5 F (36.9 C)  TempSrc: Oral Oral    Resp: Height:      Weight:    50.032 kg (110 lb 4.8 oz)  SpO2: 96% 97% 98% 98%    Weight change: -1.208 kg (-2 lb 10.6 oz) Filed Weights   07/29/15 0351 07/30/15 0536 07/31/15 0500  Weight: 50.122 kg (110 lb 8 oz) 51.24 kg (112 lb 15.4 oz) 50.032 kg (110 lb 4.8 oz)   Body mass index is 18.92 kg/(m^2).   Gen Exam: Awake and alert with clear speech. Chronically sick appearing Neck: Supple, No JVD.   Chest: B/L Clear.  No rales or rhonchi CVS: S1 S2 Regular, no murmurs.  Abdomen: soft, BS +, non tender, bladder palpable. Extremities: no edema, lower extremities warm to touch. Neurologic: Non Focal.   Skin: No Rash.   Wounds: N/A.   Intake/Output from previous day:  Intake/Output Summary (Last 24 hours) at 07/31/15 1014 Last data filed at 07/31/15 0900  Gross per 24 hour  Intake    500 ml  Output      0 ml  Net    500 ml     LAB RESULTS: CBC  Recent Labs Lab 07/27/15 0320 07/27/15 1336 07/28/15 0430 07/29/15 0338 07/30/15 0311  WBC 18.7* 21.0* 21.6* 18.6* 10.2  HGB 17.3* 12.6 14.9 13.6 11.1*  HCT 54.1* 37.8 42.7 40.7 33.8*  PLT 159 317 PLATELET CLUMPS NOTED ON SMEAR, COUNT APPEARS DECREASED 310 263  MCV 83.1 82.4 79.7 81.1 80.5  MCH 26.6 27.5 27.8 27.1 26.4  MCHC 32.0 33.3 34.9 33.4 32.8  RDW 13.6 13.7 13.6 13.6 13.7  LYMPHSABS  --  2.1  --   --   --   MONOABS  --  1.2*  --   --   --   EOSABS  --  0.0  --   --   --   BASOSABS  --  0.0  --   --   --     Chemistries   Recent Labs Lab 07/28/15 0855 07/29/15 0338 07/29/15 1400 07/30/15 0311 07/31/15 0318  NA 145 141 141 143 140  K 3.1* 2.5* 2.9* 3.5 3.9  CL 109 107 107 109 105  CO2 20* GLUCOSE 174* 148* 405* 53* 178*  BUN 22* 24*  CREATININE 1.35* 0.96 1.01* 0.80 0.71  CALCIUM 8.8* 8.7* 8.4* 8.6* 8.4*  MG 1.7 1.5*  --  1.9 1.7    CBG:  Recent Labs Lab 07/30/15 1711 07/30/15 1943 07/31/15 0009 07/31/15 0424  07/31/15 0733  GLUCAP 331* 225* 235* 170* 123*    GFR Estimated Creatinine Clearance: 48.7 mL/min (by C-G formula based on Cr of 0.71).  Coagulation profile No results for input(s): INR, PROTIME in the last 168 hours.  Cardiac Enzymes  Recent Labs Lab 07/27/15 0859 07/27/15 1337 07/27/15 2130  TROPONINI 0.23* 0.24* 0.20*    Invalid input(s): POCBNP No results for input(s): DDIMER in the last 72 hours. No results for input(s): HGBA1C in the last 72 hours. No results for input(s): CHOL, HDL, LDLCALC, TRIG, CHOLHDL, LDLDIRECT in the last 72 hours. No results for input(s): TSH, T4TOTAL, T3FREE, THYROIDAB in the last 72 hours.  Invalid input(s): FREET3 No results for input(s): VITAMINB12,  FOLATE, FERRITIN, TIBC, IRON, RETICCTPCT in the last 72 hours. No results for input(s): LIPASE, AMYLASE in the last 72 hours.  Urine Studies No results for input(s): UHGB, CRYS in the last 72 hours.  Invalid input(s): UACOL, UAPR, USPG, UPH, UTP, UGL, UKET, UBIL, UNIT, UROB, ULEU, UEPI, UWBC, URBC, UBAC, CAST, UCOM, BILUA  MICROBIOLOGY: Recent Results (from the past 240 hour(s))  Blood culture (routine x 2)     Status: None (Preliminary result)   Collection Time: 07/26/15  6:58 PM  Result Value Ref Range Status   Specimen Description BLOOD LEFT ANTECUBITAL  Final   Special Requests BOTTLES DRAWN AEROBIC AND ANAEROBIC 2CC  Final   Culture NO GROWTH 4 DAYS  Final   Report Status PENDING  Incomplete  Blood culture (routine x 2)     Status: None (Preliminary result)   Collection Time: 07/26/15  7:43 PM  Result Value Ref Range Status   Specimen Description BLOOD LEFT HAND  Final   Special Requests BOTTLES DRAWN AEROBIC ONLY 3CC  Final   Culture NO GROWTH 4 DAYS  Final   Report Status PENDING  Incomplete  Urine culture     Status: None   Collection Time: 07/26/15  8:23 PM  Result Value Ref Range Status   Specimen Description URINE, CATHETERIZED  Final   Special Requests NONE  Final    Culture 50,000 COLONIES/mL ESCHERICHIA COLI  Final   Report Status 07/29/2015 FINAL  Final   Organism ID, Bacteria ESCHERICHIA COLI  Final      Susceptibility   Escherichia coli - MIC*    AMPICILLIN <=2 SENSITIVE Sensitive     CEFAZOLIN <=4 SENSITIVE Sensitive     CEFTRIAXONE <=1 SENSITIVE Sensitive     CIPROFLOXACIN <=0.25 SENSITIVE Sensitive     GENTAMICIN <=1 SENSITIVE Sensitive     IMIPENEM <=0.25 SENSITIVE Sensitive     NITROFURANTOIN <=16 SENSITIVE Sensitive     TRIMETH/SULFA <=20 SENSITIVE Sensitive     AMPICILLIN/SULBACTAM <=2 SENSITIVE Sensitive     PIP/TAZO <=4 SENSITIVE Sensitive     * 50,000 COLONIES/mL ESCHERICHIA COLI  Clostridium Difficile by PCR (not at [facility])     Status: None   Collection Time: 07/28/15  2:56 PM  Result Value Ref Range Status   C difficile by pcr NEGATIVE NEGATIVE Final    RADIOLOGY STUDIES/RESULTS: Ct Abdomen Pelvis Wo Contrast  07/28/2015   CLINICAL DATA:  Found unresponsive on the floor.  EXAM: CT ABDOMEN AND PELVIS WITHOUT CONTRAST  TECHNIQUE: Multidetector CT imaging of the abdomen and pelvis was performed following the standard protocol without IV contrast.  COMPARISON:  None.  FINDINGS: Atheromatous calcifications, including the coronary arteries. Small amount of linear atelectasis or scarring at both lung bases. Small liver cysts. Distended urinary bladder. Right posterior bladder diverticulum or loculated free peritoneal fluid. Left ovarian coarse calcification and 2.2 cm cyst. Surgically absent uterus.  Concentric inferior rectal wall thickening with irregular luminal narrowing. No bowel dilatation. No enlarged lymph nodes. Mild to moderate dilatation of both renal collecting systems, or proximal ureters and mid ureters. No obstructing mass or calculi seen.  No findings suspicious for appendicitis. Unremarkable non contrasted appearance of the spleen, pancreas, gallbladder and adrenal glands. Mild lumbar spine degenerative changes. 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  IMPRESSION: 1. Diffuse, concentric inferior rectal wall thickening with irregular luminal narrowing. This is suspicious for a rectal neoplasm. Proctitis is less likely. 2. Mild to moderate bilateral hydronephrosis and hydroureter, most  likely due to bladder distention. 3. 2.2 cm left ovarian cyst. This is almost certainly benign, but follow up ultrasound is recommended in 1 year according to the Society of Radiologists in Ultrasound 2010 Consensus Conference Statement (D Lenis Noon et al. Management of Asymptomatic Ovarian and Other Adnexal Cysts Imaged at [place]: Society of Radiologists in Ultrasound Consensus Conference Statement 2010. Radiology 256 (Sept 2010): 943-954.). 4. Right posterior bladder diverticulum or loculated fluid. An ovarian cyst is less likely.   Electronically Signed   By: Beckie Salts M.D.   On: 07/28/2015 15:56   Ct Head Wo Contrast  07/27/2015   CLINICAL DATA:  Weakness and dizziness.  Hypotension.  EXAM: CT HEAD WITHOUT CONTRAST  TECHNIQUE: Contiguous axial images were obtained from the base of the skull through the vertex without intravenous contrast.  COMPARISON:  None.  FINDINGS: Chronic ischemic changes in the periventricular white matter. Mild global atrophy appropriate to age. No mass effect, midline shift, or acute intracranial hemorrhage. Mastoid air cells are clear. Minimal bubbly mucous material in the left sphenoid sinus. Visualized paranasal sinuses are otherwise clear. Cranium is intact.  IMPRESSION: No acute intracranial pathology.  Chronic changes are noted.   Electronically Signed   By: Jolaine Click M.D.   On: 07/27/2015 11:32   Mr Maxine Glenn Head Wo Contrast  07/27/2015   CLINICAL DATA:  Syncope. Found down. Increased weakness the last 2 days. Prior stroke.  EXAM: MRI HEAD WITHOUT CONTRAST  MRA HEAD WITHOUT CONTRAST  TECHNIQUE: Multiplanar, multiecho pulse sequences of the brain and surrounding structures were obtained  without intravenous contrast. Angiographic images of the head were obtained using MRA technique without contrast.  COMPARISON:  Head CT 07/27/2015  FINDINGS: MRI HEAD FINDINGS  There is no evidence of acute infarct, mass, midline shift, or extra-axial fluid collection. There is mild generalized cerebral atrophy. Patchy and confluent T2 hyperintensities involving the deep greater than subcortical cerebral white matter bilaterally and pons are nonspecific but compatible with moderate chronic small vessel ischemic disease. There may be 1 or 2 tiny chronic infarcts in the cerebellum.  There is a chronic hemorrhagic infarct involving the body of the right caudate nucleus and internal capsule. Numerous additional T2 hyperintense foci throughout the basal ganglia likely represent dilated perivascular spaces. Chronic lacunar infarcts and microhemorrhages are noted in the thalami.  Prior bilateral cataract extraction is noted. Paranasal sinuses and mastoid air cells are clear. Major intracranial vascular flow voids are preserved.  MRA HEAD FINDINGS  The visualized distal vertebral arteries are patent with the right being dominant. PICA origins are patent. Right AICA origin is patent. SCA origins are patent. Basilar artery is patent without stenosis. There is a patent left posterior communicating artery. PCAs are patent with mild narrowing of the left P1 segment, although this may be at least partly developmental given the presence of a medium-sized ipsilateral posterior communicating artery. There is mild PCA branch vessel irregularity bilaterally.  Internal carotid arteries are patent from skullbase to carotid termini without stenosis. Anterior communicating artery is patent. Proximal A1 stenoses are moderate to severe on the right and mild on the left. MCAs are patent with mild-to-moderate branch vessel irregularity but no evidence of significant proximal stenosis. No intracranial aneurysm is identified.  IMPRESSION: 1. No  acute intracranial abnormality. 2. Moderate chronic small vessel ischemic disease. 3. 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 4. 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 5. No evidence of major intracranial arterial occlusion. 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   Electronically Signed   By: Sebastian Ache   On: 07/27/2015 11:50   Mr Brain Wo Contrast  07/27/2015   CLINICAL DATA:  Syncope. Found down. Increased weakness the last 2 days. Prior stroke.  EXAM: MRI HEAD WITHOUT CONTRAST  MRA HEAD WITHOUT CONTRAST  TECHNIQUE: Multiplanar, multiecho pulse sequences of the brain and surrounding structures were obtained without intravenous contrast. Angiographic images of the head were obtained using MRA technique without contrast.  COMPARISON:  Head CT 07/27/2015  FINDINGS: MRI HEAD FINDINGS  There is no evidence of acute infarct, mass, midline shift, or extra-axial fluid collection. There is mild generalized cerebral atrophy. Patchy and confluent T2 hyperintensities involving the deep greater than subcortical cerebral white matter bilaterally and pons are nonspecific but compatible with moderate chronic small vessel ischemic disease. There may be 1 or 2 tiny chronic infarcts in the cerebellum.  There is a chronic hemorrhagic infarct involving the body of the right caudate nucleus and internal capsule. Numerous additional T2 hyperintense foci throughout the basal ganglia likely represent dilated perivascular spaces. Chronic lacunar infarcts and microhemorrhages are noted in the thalami.  Prior bilateral cataract extraction is noted. Paranasal sinuses and mastoid air cells are clear. Major intracranial vascular flow voids are preserved.  MRA HEAD FINDINGS  The visualized distal vertebral arteries are patent with the right being dominant. PICA origins are patent. Right AICA origin is patent. SCA origins are patent. Basilar artery is patent without stenosis. There is a patent left  posterior communicating artery. PCAs are patent with mild narrowing of the left P1 segment, although this may be at least partly developmental given the presence of a medium-sized ipsilateral posterior communicating artery. There is mild PCA branch vessel irregularity bilaterally.  Internal carotid arteries are patent from skullbase to carotid termini without stenosis. Anterior communicating artery is patent. Proximal A1 stenoses are moderate to severe on the right and mild on the left. MCAs are patent with mild-to-moderate branch vessel irregularity but no evidence of significant proximal stenosis. No intracranial aneurysm is identified.  IMPRESSION: 1. No acute intracranial abnormality. 2. Moderate chronic small vessel ischemic disease. 3. 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 4. 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 5. No evidence of major intracranial arterial occlusion. 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   Electronically Signed   By: Sebastian Ache   On: 07/27/2015 11:50   Dg Chest Portable 1 View  07/26/2015   CLINICAL DATA:  Mallet pain for 1 week.  Hypotension today.  EXAM: PORTABLE CHEST - 1 VIEW  COMPARISON:  None.  FINDINGS: The lungs are clear. Heart size is normal. No pneumothorax or pleural effusion.  IMPRESSION: No acute disease.   Electronically Signed   By: Drusilla Kanner M.D.   On: 07/26/2015 19:15    Jeoffrey Massed, MD  [facility] Pager:[phone]  If 7PM-7AM, please contact night-coverage [email or web address] Password [email or web address] 07/31/2015, 10:14 AM   LOS: 5 days

## 2015-07-31 NOTE — Progress Notes (Signed)
Patient voided 350 ml's. Bladder scan completed with 147 ml's left in bladder. Pt also had soft BM. MD paged to make aware. Will continue to monitor.

## 2015-07-31 NOTE — Progress Notes (Signed)
Foley removed per MD verbal order for voiding trial. Will monitor.

## 2015-08-01 ENCOUNTER — Encounter (HOSPITAL_COMMUNITY): Payer: Self-pay | Admitting: Gastroenterology

## 2015-08-01 ENCOUNTER — Inpatient Hospital Stay (HOSPITAL_COMMUNITY): Payer: Medicare Other

## 2015-08-01 DIAGNOSIS — N1339 Other hydronephrosis: Secondary | ICD-10-CM

## 2015-08-01 DIAGNOSIS — I951 Orthostatic hypotension: Secondary | ICD-10-CM

## 2015-08-01 DIAGNOSIS — N1330 Unspecified hydronephrosis: Secondary | ICD-10-CM | POA: Insufficient documentation

## 2015-08-01 DIAGNOSIS — D72829 Elevated white blood cell count, unspecified: Secondary | ICD-10-CM

## 2015-08-01 LAB — GLUCOSE, CAPILLARY
GLUCOSE-CAPILLARY: 165 mg/dL — AB (ref 65–99)
GLUCOSE-CAPILLARY: 218 mg/dL — AB (ref 65–99)
Glucose-Capillary: 181 mg/dL — ABNORMAL HIGH (ref 65–99)
Glucose-Capillary: 155 mg/dL — ABNORMAL HIGH (ref 65–99)
Glucose-Capillary: 174 mg/dL — ABNORMAL HIGH (ref 65–99)
Glucose-Capillary: 183 mg/dL — ABNORMAL HIGH (ref 65–99)

## 2015-08-01 LAB — MAGNESIUM: Magnesium: 1.6 mg/dL — ABNORMAL LOW (ref 1.7–2.4)

## 2015-08-01 MED ORDER — MAGNESIUM SULFATE 2 GM/50ML IV SOLN
2.0000 g | Freq: Once | INTRAVENOUS | Status: AC
  Administered 2015-08-01: 2 g via INTRAVENOUS
  Filled 2015-08-01: qty 50

## 2015-08-01 NOTE — Clinical Documentation Improvement (Signed)
Would you please help clarify medical condition related to clinical findings? UTI present with ecoli. Unable to determine Other clinical explanation  Urine essentially normal.  Urine culture growing > 50,000 colonies of ecoli  Thank You, Harrie Jeans ,RN Clinical Documentation Specialist:    [facility]- Health Information Management [phone] Cell - [phone]

## 2015-08-01 NOTE — Progress Notes (Signed)
Subjective: Mild longstanding lower abdominal discomfort. No blood in stool.  Objective: Vital signs in last 24 hours: Temp:  [98.7 F (37.1 C)-99.9 F (37.7 C)] 98.7 F (37.1 C) (08/01 0500) Pulse Rate:  [69-76] 69 (08/01 0500) Resp:  [11-15] 15 (08/01 0500) BP: (138-145)/(73-78) 145/78 mmHg (08/01 0500) SpO2:  [97 %-99 %] 98 % (08/01 0500) Weight:  [54.386 kg (119 lb 14.4 oz)] 54.386 kg (119 lb 14.4 oz) (08/01 0500) Weight change: 4.355 kg (9 lb 9.6 oz) Last BM Date: 07/29/15  PE: GEN:  Elderly, somewhat frail-appearing, NAD  Lab Results: CBC    Component Value Date/Time   WBC 10.2 07/30/2015 0311   RBC 4.20 07/30/2015 0311   HGB 11.1* 07/30/2015 0311   HCT 33.8* 07/30/2015 0311   PLT 263 07/30/2015 0311   MCV 80.5 07/30/2015 0311   MCH 26.4 07/30/2015 0311   MCHC 32.8 07/30/2015 0311   RDW 13.7 07/30/2015 0311   LYMPHSABS 2.1 07/27/2015 1336   MONOABS 1.2* 07/27/2015 1336   EOSABS 0.0 07/27/2015 1336   BASOSABS 0.0 07/27/2015 1336   CMP     Component Value Date/Time   NA 140 07/31/2015 0318   K 3.9 07/31/2015 0318   CL 105 07/31/2015 0318   CO2 27 07/31/2015 0318   GLUCOSE 178* 07/31/2015 0318   BUN 24* 07/31/2015 0318   CREATININE 0.71 07/31/2015 0318   CALCIUM 8.4* 07/31/2015 0318   PROT 7.5 07/28/2015 0855   ALBUMIN 2.9* 07/28/2015 0855   AST 100* 07/28/2015 0855   ALT 152* 07/28/2015 0855   ALKPHOS 74 07/28/2015 0855   BILITOT 1.2 07/28/2015 0855   GFRNONAA >60 07/31/2015 0318   GFRAA >60 07/31/2015 0318   Assessment:  1.  Abnormal CT scan, rectal thickening. 2.  Rectal  Ulcer, clean-based, seen on recent sigmoidoscopy, not frankly malignant-appearing, biopsies pending.  Plan:  1.  Advance diet as tolerated. 2.  Awaiting biopsy results. 3.  Next step in management pending biopsy results.   Dana Blair 08/01/2015, 10:27 AM   Pager [phone] If no answer or after 5 PM call [phone]

## 2015-08-01 NOTE — Clinical Documentation Improvement (Signed)
Would you please help clarify the specific medical condition related to clinical findings? Sepsis due to UTI with ecoli. Severe Sepsis Severe sepsis with shock Unable to determine Other clinical explanation  In ED, pt had BP of 61/40, Pulse = 93, Resp = 36, Temp = 96.4, MAP = 65.  Lactic acid was 15.13, with repeat as 4.01 and then went to 1.04.  WBC on admission was 14.2 and continues to rise to 18.7, 21, 21.6.  In progress note of 07/27/15, it is noted that pt meets criteria for sepsis.  No further mention of sepsis in record and that pt meets sepsis criteria.        Urine culture growing ecoli.   Thank You, Harrie Jeans ,RN Clinical Documentation Specialist:    [facility]- Health Information Management [phone] Cell - [phone]

## 2015-08-01 NOTE — Care Management Important Message (Signed)
Important Message  Patient Details  Name: Dana Blair MRN: [id number] Date of Birth: 28-Oct-1941   Medicare Important Message Given:  Yes-third notification given    [name] 08/01/2015, 12:33 PM

## 2015-08-01 NOTE — Progress Notes (Signed)
PATIENT DETAILS Name: Dana Blair Age: 74 y.o. Sex: female Date of Birth: 05/17/1941 Admit Date: 07/26/2015 Admitting Physician Hillary Bow, DO PCP:SUN,VYVYAN Y, MD  Subjective: Feels much better. No diarrhea-urinating well.  Assessment/Plan: Principal Problem: Syncope and collapse: Likely secondary to orthostatic hypotension. Treated with IV fluids, with resolution of orthostatics. Continue TED hose.   Active Problems: Acute renal failure: Resolved. Etiology felt to be multifactorial-from dehydration/diarrhea/lisinopril and HCTZ use. Follow electrolytes periodically.  Leukocytosis:Resolved. UA/chest x-ray negative for UTI/pneumonia. Blood cultures negative, urine culture on 7/26 positive for 50,000 colonies of Escherichia coli-given bilateral hydronephrosis-covered IV Rocephin-although UA on same date negative. Will plan on atleast 5 day course of Abx.CT abdomen shows distended bladder with bilateral hydronephrosis and rectal thickening.   Urinary retention: Placed Foley catheter. Voiding trial successful-since prior CT abdomen showed distended bladder with bilateral hydronephrosis-will check a renal ultrasound to see if hydronephroses has resolved.  Rectal thickening seen on CT scan abdomen: GI consultation placed. Given weight loss-high suspicion for malignancy. Underwent flexible sigmoidoscopy on 7/30 which showed ulceration/inflammation in the distal rectum-await biopsy results.  Chronic diastolic heart failure: Clinically compensated. Continue Bystolic cautiously  Hypokalemia: Suspect secondary to GI loss. Repleted, recheck electrolytes periodically.  Mildly elevated troponins: Not in the pattern of ACS-likely demand ischemia-however given ST changes and EKG -consulted cardiology. 2-D echocardiogram shows no regional wall motion abnormality.  Essential hypertension: Controlled with Bystolic. Follow.  Type 2 diabetes: CBGs controlled with SSI. Resume  oral hypoglycemics on discharge  Dyslipidemia: Hold Vytorin for now.  Severe malnutrition: Continue supplements  Disposition: Remain inpatient-home with home health services in the next 1-2 days once workup is complete  Antimicrobial agents  See below  Anti-infectives    Start     Dose/Rate Route Frequency Ordered Stop   07/29/15 1900  cefTRIAXone (ROCEPHIN) 1 g in dextrose 5 % 50 mL IVPB - Premix     1 g 100 mL/hr over 30 Minutes Intravenous Every 24 hours 07/29/15 1828     07/28/15 1000  vancomycin (VANCOCIN) IVPB 750 mg/150 ml premix  Status:  Discontinued     750 mg 150 mL/hr over 60 Minutes Intravenous Every 24 hours 07/27/15 0915 07/28/15 0901   07/27/15 1800  piperacillin-tazobactam (ZOSYN) IVPB 3.375 g  Status:  Discontinued     3.375 g 12.5 mL/hr over 240 Minutes Intravenous Every 8 hours 07/27/15 0915 07/28/15 0901   07/27/15 0915  vancomycin (VANCOCIN) IVPB 1000 mg/200 mL premix  Status:  Discontinued     1,000 mg 200 mL/hr over 60 Minutes Intravenous  Once 07/27/15 0913 07/29/15 0917   07/27/15 0915  piperacillin-tazobactam (ZOSYN) IVPB 3.375 g     3.375 g 100 mL/hr over 30 Minutes Intravenous  Once 07/27/15 0913 07/27/15 1200      DVT Prophylaxis: SCD's  Code Status: Full code  Family Communication None at bedside  Procedures: None  CONSULTS:  cardiology and GI  Time spent 25 minutes-Greater than 50% of this time was spent in counseling, explanation of diagnosis, planning of further management, and coordination of care  MEDICATIONS: Scheduled Meds: . aspirin EC  81 mg Oral Daily  . cefTRIAXone (ROCEPHIN)  IV  1 g Intravenous Q24H  . feeding supplement (ENSURE ENLIVE)  237 mL Oral TID BM  . insulin aspart  0-9 Units Subcutaneous 6 times per day  . nebivolol  5 mg Oral Daily   Continuous Infusions:  PRN  Meds:.    PHYSICAL EXAM: Vital signs in last 24 hours: Filed Vitals:   07/31/15 1015 07/31/15 1500 07/31/15 2000 08/01/15 0500  BP:  135/66 140/73 138/74 145/78  Pulse:  76 72 69  Temp:  99.2 F (37.3 C) 99.9 F (37.7 C) 98.7 F (37.1 C)  TempSrc:  Oral    Resp:   11 15  Height:      Weight:    54.386 kg (119 lb 14.4 oz)  SpO2:  99% 97% 98%    Weight change: 4.355 kg (9 lb 9.6 oz) Filed Weights   07/30/15 0536 07/31/15 0500 08/01/15 0500  Weight: 51.24 kg (112 lb 15.4 oz) 50.032 kg (110 lb 4.8 oz) 54.386 kg (119 lb 14.4 oz)   Body mass index is 20.57 kg/(m^2).   Gen Exam: Awake and alert with clear speech. Chronically sick appearing Neck: Supple, No JVD.   Chest: B/L Clear.  No rales or rhonchi CVS: S1 S2 Regular, no murmurs.  Abdomen: soft, BS +, non tender, bladder palpable. Extremities: no edema, lower extremities warm to touch. Neurologic: Non Focal.   Skin: No Rash.   Wounds: N/A.   Intake/Output from previous day:  Intake/Output Summary (Last 24 hours) at 08/01/15 1121 Last data filed at 07/31/15 1700  Gross per 24 hour  Intake    827 ml  Output    447 ml  Net    380 ml     LAB RESULTS: CBC  Recent Labs Lab 07/27/15 0320 07/27/15 1336 07/28/15 0430 07/29/15 0338 07/30/15 0311  WBC 18.7* 21.0* 21.6* 18.6* 10.2  HGB 17.3* 12.6 14.9 13.6 11.1*  HCT 54.1* 37.8 42.7 40.7 33.8*  PLT 159 317 PLATELET CLUMPS NOTED ON SMEAR, COUNT APPEARS DECREASED 310 263  MCV 83.1 82.4 79.7 81.1 80.5  MCH 26.6 27.5 27.8 27.1 26.4  MCHC 32.0 33.3 34.9 33.4 32.8  RDW 13.6 13.7 13.6 13.6 13.7  LYMPHSABS  --  2.1  --   --   --   MONOABS  --  1.2*  --   --   --   EOSABS  --  0.0  --   --   --   BASOSABS  --  0.0  --   --   --     Chemistries   Recent Labs Lab 07/28/15 0855 07/29/15 0338 07/29/15 1400 07/30/15 0311 07/31/15 0318 08/01/15 0345  NA 145 141 141 143 140  --   K 3.1* 2.5* 2.9* 3.5 3.9  --   CL 109 107 107 109 105  --   CO2 20* 24 27 28 27   --   GLUCOSE 174* 148* 405* 53* 178*  --   BUN 22* 14 18 17  24*  --   CREATININE 1.35* 0.96 1.01* 0.80 0.71  --   CALCIUM 8.8* 8.7* 8.4*  8.6* 8.4*  --   MG 1.7 1.5*  --  1.9 1.7 1.6*    CBG:  Recent Labs Lab 07/31/15 1659 07/31/15 2028 08/01/15 0012 08/01/15 0419 08/01/15 0716  GLUCAP 296* 215* 218* 181* 165*    GFR Estimated Creatinine Clearance: 53 mL/min (by C-G formula based on Cr of 0.71).  Coagulation profile No results for input(s): INR, PROTIME in the last 168 hours.  Cardiac Enzymes  Recent Labs Lab 07/27/15 0859 07/27/15 1337 07/27/15 2130  TROPONINI 0.23* 0.24* 0.20*    Invalid input(s): POCBNP No results for input(s): DDIMER in the last 72 hours. No results for input(s): HGBA1C in the last 72  hours. No results for input(s): CHOL, HDL, LDLCALC, TRIG, CHOLHDL, LDLDIRECT in the last 72 hours. No results for input(s): TSH, T4TOTAL, T3FREE, THYROIDAB in the last 72 hours.  Invalid input(s): FREET3 No results for input(s): VITAMINB12, FOLATE, FERRITIN, TIBC, IRON, RETICCTPCT in the last 72 hours. No results for input(s): LIPASE, AMYLASE in the last 72 hours.  Urine Studies No results for input(s): UHGB, CRYS in the last 72 hours.  Invalid input(s): UACOL, UAPR, USPG, UPH, UTP, UGL, UKET, UBIL, UNIT, UROB, ULEU, UEPI, UWBC, URBC, UBAC, CAST, UCOM, BILUA  MICROBIOLOGY: Recent Results (from the past 240 hour(s))  Blood culture (routine x 2)     Status: None   Collection Time: 07/26/15  6:58 PM  Result Value Ref Range Status   Specimen Description BLOOD LEFT ANTECUBITAL  Final   Special Requests BOTTLES DRAWN AEROBIC AND ANAEROBIC 2CC  Final   Culture NO GROWTH 5 DAYS  Final   Report Status 07/31/2015 FINAL  Final  Blood culture (routine x 2)     Status: None   Collection Time: 07/26/15  7:43 PM  Result Value Ref Range Status   Specimen Description BLOOD LEFT HAND  Final   Special Requests BOTTLES DRAWN AEROBIC ONLY 3CC  Final   Culture NO GROWTH 5 DAYS  Final   Report Status 07/31/2015 FINAL  Final  Urine culture     Status: None   Collection Time: 07/26/15  8:23 PM  Result Value Ref  Range Status   Specimen Description URINE, CATHETERIZED  Final   Special Requests NONE  Final   Culture 50,000 COLONIES/mL ESCHERICHIA COLI  Final   Report Status 07/29/2015 FINAL  Final   Organism ID, Bacteria ESCHERICHIA COLI  Final      Susceptibility   Escherichia coli - MIC*    AMPICILLIN <=2 SENSITIVE Sensitive     CEFAZOLIN <=4 SENSITIVE Sensitive     CEFTRIAXONE <=1 SENSITIVE Sensitive     CIPROFLOXACIN <=0.25 SENSITIVE Sensitive     GENTAMICIN <=1 SENSITIVE Sensitive     IMIPENEM <=0.25 SENSITIVE Sensitive     NITROFURANTOIN <=16 SENSITIVE Sensitive     TRIMETH/SULFA <=20 SENSITIVE Sensitive     AMPICILLIN/SULBACTAM <=2 SENSITIVE Sensitive     PIP/TAZO <=4 SENSITIVE Sensitive     * 50,000 COLONIES/mL ESCHERICHIA COLI  Clostridium Difficile by PCR (not at [facility])     Status: None   Collection Time: 07/28/15  2:56 PM  Result Value Ref Range Status   C difficile by pcr NEGATIVE NEGATIVE Final    RADIOLOGY STUDIES/RESULTS: Ct Abdomen Pelvis Wo Contrast  07/28/2015   CLINICAL DATA:  Found unresponsive on the floor.  EXAM: CT ABDOMEN AND PELVIS WITHOUT CONTRAST  TECHNIQUE: Multidetector CT imaging of the abdomen and pelvis was performed following the standard protocol without IV contrast.  COMPARISON:  None.  FINDINGS: Atheromatous calcifications, including the coronary arteries. Small amount of linear atelectasis or scarring at both lung bases. Small liver cysts. Distended urinary bladder. Right posterior bladder diverticulum or loculated free peritoneal fluid. Left ovarian coarse calcification and 2.2 cm cyst. Surgically absent uterus.  Concentric inferior rectal wall thickening with irregular luminal narrowing. No bowel dilatation. No enlarged lymph nodes. Mild to moderate dilatation of both renal collecting systems, or proximal ureters and mid ureters. No obstructing mass or calculi seen.  No findings suspicious for appendicitis. Unremarkable non contrasted appearance of the spleen,  pancreas, gallbladder and adrenal glands. Mild lumbar spine degenerative changes. 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  IMPRESSION: 1. Diffuse, concentric inferior rectal wall thickening with irregular luminal narrowing. This is suspicious for a rectal neoplasm. Proctitis is less likely. 2. Mild to moderate bilateral hydronephrosis and hydroureter, most likely due to bladder distention. 3. 2.2 cm left ovarian cyst. This is almost certainly benign, but follow up ultrasound is recommended in 1 year according to the Society of Radiologists in Ultrasound 2010 Consensus Conference Statement (D Lenis Noon et al. Management of Asymptomatic Ovarian and Other Adnexal Cysts Imaged at [place]: Society of Radiologists in Ultrasound Consensus Conference Statement 2010. Radiology 256 (Sept 2010): 943-954.). 4. Right posterior bladder diverticulum or loculated fluid. An ovarian cyst is less likely.   Electronically Signed   By: Beckie Salts M.D.   On: 07/28/2015 15:56   Ct Head Wo Contrast  07/27/2015   CLINICAL DATA:  Weakness and dizziness.  Hypotension.  EXAM: CT HEAD WITHOUT CONTRAST  TECHNIQUE: Contiguous axial images were obtained from the base of the skull through the vertex without intravenous contrast.  COMPARISON:  None.  FINDINGS: Chronic ischemic changes in the periventricular white matter. Mild global atrophy appropriate to age. No mass effect, midline shift, or acute intracranial hemorrhage. Mastoid air cells are clear. Minimal bubbly mucous material in the left sphenoid sinus. Visualized paranasal sinuses are otherwise clear. Cranium is intact.  IMPRESSION: No acute intracranial pathology.  Chronic changes are noted.   Electronically Signed   By: Jolaine Click M.D.   On: 07/27/2015 11:32   Mr Maxine Glenn Head Wo Contrast  07/27/2015   CLINICAL DATA:  Syncope. Found down. Increased weakness the last 2 days. Prior stroke.  EXAM: MRI HEAD WITHOUT CONTRAST  MRA HEAD WITHOUT CONTRAST  TECHNIQUE:  Multiplanar, multiecho pulse sequences of the brain and surrounding structures were obtained without intravenous contrast. Angiographic images of the head were obtained using MRA technique without contrast.  COMPARISON:  Head CT 07/27/2015  FINDINGS: MRI HEAD FINDINGS  There is no evidence of acute infarct, mass, midline shift, or extra-axial fluid collection. There is mild generalized cerebral atrophy. Patchy and confluent T2 hyperintensities involving the deep greater than subcortical cerebral white matter bilaterally and pons are nonspecific but compatible with moderate chronic small vessel ischemic disease. There may be 1 or 2 tiny chronic infarcts in the cerebellum.  There is a chronic hemorrhagic infarct involving the body of the right caudate nucleus and internal capsule. Numerous additional T2 hyperintense foci throughout the basal ganglia likely represent dilated perivascular spaces. Chronic lacunar infarcts and microhemorrhages are noted in the thalami.  Prior bilateral cataract extraction is noted. Paranasal sinuses and mastoid air cells are clear. Major intracranial vascular flow voids are preserved.  MRA HEAD FINDINGS  The visualized distal vertebral arteries are patent with the right being dominant. PICA origins are patent. Right AICA origin is patent. SCA origins are patent. Basilar artery is patent without stenosis. There is a patent left posterior communicating artery. PCAs are patent with mild narrowing of the left P1 segment, although this may be at least partly developmental given the presence of a medium-sized ipsilateral posterior communicating artery. There is mild PCA branch vessel irregularity bilaterally.  Internal carotid arteries are patent from skullbase to carotid termini without stenosis. Anterior communicating artery is patent. Proximal A1 stenoses are moderate to severe on the right and mild on the left. MCAs are patent with mild-to-moderate branch vessel irregularity but no evidence  of significant proximal stenosis. No intracranial aneurysm is identified.  IMPRESSION: 1. No acute intracranial abnormality. 2. Moderate  chronic small vessel ischemic disease. 3. 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 4. 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 5. No evidence of major intracranial arterial occlusion. 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   Electronically Signed   By: Sebastian Ache   On: 07/27/2015 11:50   Mr Brain Wo Contrast  07/27/2015   CLINICAL DATA:  Syncope. Found down. Increased weakness the last 2 days. Prior stroke.  EXAM: MRI HEAD WITHOUT CONTRAST  MRA HEAD WITHOUT CONTRAST  TECHNIQUE: Multiplanar, multiecho pulse sequences of the brain and surrounding structures were obtained without intravenous contrast. Angiographic images of the head were obtained using MRA technique without contrast.  COMPARISON:  Head CT 07/27/2015  FINDINGS: MRI HEAD FINDINGS  There is no evidence of acute infarct, mass, midline shift, or extra-axial fluid collection. There is mild generalized cerebral atrophy. Patchy and confluent T2 hyperintensities involving the deep greater than subcortical cerebral white matter bilaterally and pons are nonspecific but compatible with moderate chronic small vessel ischemic disease. There may be 1 or 2 tiny chronic infarcts in the cerebellum.  There is a chronic hemorrhagic infarct involving the body of the right caudate nucleus and internal capsule. Numerous additional T2 hyperintense foci throughout the basal ganglia likely represent dilated perivascular spaces. Chronic lacunar infarcts and microhemorrhages are noted in the thalami.  Prior bilateral cataract extraction is noted. Paranasal sinuses and mastoid air cells are clear. Major intracranial vascular flow voids are preserved.  MRA HEAD FINDINGS  The visualized distal vertebral arteries are patent with the right being dominant. PICA origins are patent. Right AICA origin is patent.  SCA origins are patent. Basilar artery is patent without stenosis. There is a patent left posterior communicating artery. PCAs are patent with mild narrowing of the left P1 segment, although this may be at least partly developmental given the presence of a medium-sized ipsilateral posterior communicating artery. There is mild PCA branch vessel irregularity bilaterally.  Internal carotid arteries are patent from skullbase to carotid termini without stenosis. Anterior communicating artery is patent. Proximal A1 stenoses are moderate to severe on the right and mild on the left. MCAs are patent with mild-to-moderate branch vessel irregularity but no evidence of significant proximal stenosis. No intracranial aneurysm is identified.  IMPRESSION: 1. No acute intracranial abnormality. 2. Moderate chronic small vessel ischemic disease. 3. 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 4. 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 5. No evidence of major intracranial arterial occlusion. 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   Electronically Signed   By: Sebastian Ache   On: 07/27/2015 11:50   Dg Chest Portable 1 View  07/26/2015   CLINICAL DATA:  Mallet pain for 1 week.  Hypotension today.  EXAM: PORTABLE CHEST - 1 VIEW  COMPARISON:  None.  FINDINGS: The lungs are clear. Heart size is normal. No pneumothorax or pleural effusion.  IMPRESSION: No acute disease.   Electronically Signed   By: Drusilla Kanner M.D.   On: 07/26/2015 19:15    Jeoffrey Massed, MD  [facility] Pager:[phone]  If 7PM-7AM, please contact night-coverage [email or web address] Password [email or web address] 08/01/2015, 11:21 AM   LOS: 6 days

## 2015-08-01 NOTE — Progress Notes (Signed)
Utilization review completed.  

## 2015-08-01 NOTE — Progress Notes (Signed)
Patient Name: Dana Blair Date of Encounter: 08/01/2015  Principal Problem:  Syncope and collapse Active Problems:  Essential hypertension  DM2 (diabetes mellitus, type 2)  AKI (acute kidney injury)  Dehydration  Occult blood positive stool  Chronic diastolic congestive heart failure  LVH (left ventricular hypertrophy)  Orthostatic hypotension  Lactic acidosis  Severe protein-calorie malnutrition  SUBJECTIVE  Feels better. Denies chest pain, sob or palpitation.   CURRENT MEDS . aspirin EC  81 mg Oral Daily  . cefTRIAXone (ROCEPHIN)  IV  1 g Intravenous Q24H  . feeding supplement (ENSURE ENLIVE)  237 mL Oral TID BM  . insulin aspart  0-9 Units Subcutaneous 6 times per day  . magnesium sulfate 1 - 4 g bolus IVPB  2 g Intravenous Once  . nebivolol  5 mg Oral Daily    OBJECTIVE  Filed Vitals:   07/31/15 1015 07/31/15 1500 07/31/15 2000 08/01/15 0500  BP: 135/66 140/73 138/74 145/78  Pulse:  76 72 69  Temp:  99.2 F (37.3 C) 99.9 F (37.7 C) 98.7 F (37.1 C)  TempSrc:  Oral    Resp:   11 15  Height:      Weight:    119 lb 14.4 oz (54.386 kg)  SpO2:  99% 97% 98%    Intake/Output Summary (Last 24 hours) at 08/01/15 0839 Last data filed at 07/31/15 1700  Gross per 24 hour  Intake   1127 ml  Output    447 ml  Net    680 ml   Filed Weights   07/30/15 0536 07/31/15 0500 08/01/15 0500  Weight: 112 lb 15.4 oz (51.24 kg) 110 lb 4.8 oz (50.032 kg) 119 lb 14.4 oz (54.386 kg)    PHYSICAL EXAM  General: Pleasant, NAD. Neuro: Alert and oriented X 3. Moves all extremities spontaneously. Psych: Normal affect. HEENT:  Normal  Neck: Supple without bruits or JVD. Lungs:  Resp regular and unlabored, CTA. Heart: RRR no s3, s4,  + 2/6 SEM. Abdomen: Soft, non-tender, non-distended, BS + x 4.  Extremities: No clubbing, cyanosis or edema. DP/PT/Radials 2+ and equal bilaterally.  Accessory Clinical Findings  CBC  Recent Labs  07/30/15 0311  WBC 10.2    HGB 11.1*  HCT 33.8*  MCV 80.5  PLT 263   Basic Metabolic Panel  Recent Labs  07/30/15 0311 07/31/15 0318 08/01/15 0345  NA 143 140  --   K 3.5 3.9  --   CL 109 105  --   CO2 28 27  --   GLUCOSE 53* 178*  --   BUN 17 24*  --   CREATININE 0.80 0.71  --   CALCIUM 8.6* 8.4*  --   MG 1.9 1.7 1.6*    TELE  NSR at rate of 60s  Radiology/Studies  Ct Abdomen Pelvis Wo Contrast  07/28/2015   CLINICAL DATA:  Found unresponsive on the floor.  EXAM: CT ABDOMEN AND PELVIS WITHOUT CONTRAST  TECHNIQUE: Multidetector CT imaging of the abdomen and pelvis was performed following the standard protocol without IV contrast.  COMPARISON:  None.  FINDINGS: Atheromatous calcifications, including the coronary arteries. Small amount of linear atelectasis or scarring at both lung bases. Small liver cysts. Distended urinary bladder. Right posterior bladder diverticulum or loculated free peritoneal fluid. Left ovarian coarse calcification and 2.2 cm cyst. Surgically absent uterus.  Concentric inferior rectal wall thickening with irregular luminal narrowing. No bowel dilatation. No enlarged lymph nodes. Mild to moderate dilatation of both renal collecting systems,  or proximal ureters and mid ureters. No obstructing mass or calculi seen.  No findings suspicious for appendicitis. Unremarkable non contrasted appearance of the spleen, pancreas, gallbladder and adrenal glands. Mild lumbar spine degenerative changes. 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  IMPRESSION: 1. Diffuse, concentric inferior rectal wall thickening with irregular luminal narrowing. This is suspicious for a rectal neoplasm. Proctitis is less likely. 2. Mild to moderate bilateral hydronephrosis and hydroureter, most likely due to bladder distention. 3. 2.2 cm left ovarian cyst. This is almost certainly benign, but follow up ultrasound is recommended in 1 year according to the Society of Radiologists in Ultrasound 2010  Consensus Conference Statement (D Lenis Noon et al. Management of Asymptomatic Ovarian and Other Adnexal Cysts Imaged at [place]: Society of Radiologists in Ultrasound Consensus Conference Statement 2010. Radiology 256 (Sept 2010): 943-954.). 4. Right posterior bladder diverticulum or loculated fluid. An ovarian cyst is less likely.   Electronically Signed   By: Beckie Salts M.D.   On: 07/28/2015 15:56   Ct Head Wo Contrast  07/27/2015   CLINICAL DATA:  Weakness and dizziness.  Hypotension.  EXAM: CT HEAD WITHOUT CONTRAST  TECHNIQUE: Contiguous axial images were obtained from the base of the skull through the vertex without intravenous contrast.  COMPARISON:  None.  FINDINGS: Chronic ischemic changes in the periventricular white matter. Mild global atrophy appropriate to age. No mass effect, midline shift, or acute intracranial hemorrhage. Mastoid air cells are clear. Minimal bubbly mucous material in the left sphenoid sinus. Visualized paranasal sinuses are otherwise clear. Cranium is intact.  IMPRESSION: No acute intracranial pathology.  Chronic changes are noted.   Electronically Signed   By: Jolaine Click M.D.   On: 07/27/2015 11:32   Mr Maxine Glenn Head Wo Contrast  07/27/2015   CLINICAL DATA:  Syncope. Found down. Increased weakness the last 2 days. Prior stroke.  EXAM: MRI HEAD WITHOUT CONTRAST  MRA HEAD WITHOUT CONTRAST  TECHNIQUE: Multiplanar, multiecho pulse sequences of the brain and surrounding structures were obtained without intravenous contrast. Angiographic images of the head were obtained using MRA technique without contrast.  COMPARISON:  Head CT 07/27/2015  FINDINGS: MRI HEAD FINDINGS  There is no evidence of acute infarct, mass, midline shift, or extra-axial fluid collection. There is mild generalized cerebral atrophy. Patchy and confluent T2 hyperintensities involving the deep greater than subcortical cerebral white matter bilaterally and pons are nonspecific but compatible with moderate chronic small  vessel ischemic disease. There may be 1 or 2 tiny chronic infarcts in the cerebellum.  There is a chronic hemorrhagic infarct involving the body of the right caudate nucleus and internal capsule. Numerous additional T2 hyperintense foci throughout the basal ganglia likely represent dilated perivascular spaces. Chronic lacunar infarcts and microhemorrhages are noted in the thalami.  Prior bilateral cataract extraction is noted. Paranasal sinuses and mastoid air cells are clear. Major intracranial vascular flow voids are preserved.  MRA HEAD FINDINGS  The visualized distal vertebral arteries are patent with the right being dominant. PICA origins are patent. Right AICA origin is patent. SCA origins are patent. Basilar artery is patent without stenosis. There is a patent left posterior communicating artery. PCAs are patent with mild narrowing of the left P1 segment, although this may be at least partly developmental given the presence of a medium-sized ipsilateral posterior communicating artery. There is mild PCA branch vessel irregularity bilaterally.  Internal carotid arteries are patent from skullbase to carotid termini without stenosis. Anterior communicating artery is patent. Proximal  A1 stenoses are moderate to severe on the right and mild on the left. MCAs are patent with mild-to-moderate branch vessel irregularity but no evidence of significant proximal stenosis. No intracranial aneurysm is identified.  IMPRESSION: 1. No acute intracranial abnormality. 2. Moderate chronic small vessel ischemic disease. 3. 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 4. 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 5. No evidence of major intracranial arterial occlusion. 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   Electronically Signed   By: Sebastian Ache   On: 07/27/2015 11:50   Mr Brain Wo Contrast  07/27/2015   CLINICAL DATA:  Syncope. Found down. Increased weakness the last 2 days. Prior stroke.   EXAM: MRI HEAD WITHOUT CONTRAST  MRA HEAD WITHOUT CONTRAST  TECHNIQUE: Multiplanar, multiecho pulse sequences of the brain and surrounding structures were obtained without intravenous contrast. Angiographic images of the head were obtained using MRA technique without contrast.  COMPARISON:  Head CT 07/27/2015  FINDINGS: MRI HEAD FINDINGS  There is no evidence of acute infarct, mass, midline shift, or extra-axial fluid collection. There is mild generalized cerebral atrophy. Patchy and confluent T2 hyperintensities involving the deep greater than subcortical cerebral white matter bilaterally and pons are nonspecific but compatible with moderate chronic small vessel ischemic disease. There may be 1 or 2 tiny chronic infarcts in the cerebellum.  There is a chronic hemorrhagic infarct involving the body of the right caudate nucleus and internal capsule. Numerous additional T2 hyperintense foci throughout the basal ganglia likely represent dilated perivascular spaces. Chronic lacunar infarcts and microhemorrhages are noted in the thalami.  Prior bilateral cataract extraction is noted. Paranasal sinuses and mastoid air cells are clear. Major intracranial vascular flow voids are preserved.  MRA HEAD FINDINGS  The visualized distal vertebral arteries are patent with the right being dominant. PICA origins are patent. Right AICA origin is patent. SCA origins are patent. Basilar artery is patent without stenosis. There is a patent left posterior communicating artery. PCAs are patent with mild narrowing of the left P1 segment, although this may be at least partly developmental given the presence of a medium-sized ipsilateral posterior communicating artery. There is mild PCA branch vessel irregularity bilaterally.  Internal carotid arteries are patent from skullbase to carotid termini without stenosis. Anterior communicating artery is patent. Proximal A1 stenoses are moderate to severe on the right and mild on the left. MCAs are  patent with mild-to-moderate branch vessel irregularity but no evidence of significant proximal stenosis. No intracranial aneurysm is identified.  IMPRESSION: 1. No acute intracranial abnormality. 2. Moderate chronic small vessel ischemic disease. 3. 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 4. 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 5. No evidence of major intracranial arterial occlusion. 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   Electronically Signed   By: Sebastian Ache   On: 07/27/2015 11:50   Dg Chest Portable 1 View  07/26/2015   CLINICAL DATA:  Mallet pain for 1 week.  Hypotension today.  EXAM: PORTABLE CHEST - 1 VIEW  COMPARISON:  None.  FINDINGS: The lungs are clear. Heart size is normal. No pneumothorax or pleural effusion.  IMPRESSION: No acute disease.   Electronically Signed   By: Drusilla Kanner M.D.   On: 07/26/2015 19:15    ASSESSMENT AND PLAN   74 year old woman found down at home by her son on 07/26/15. She has a past history of hypertension and mild valvular heart disease. No history of ischemic heart disease.  1. Syncope and collapse - Likely secondary to dehydration and orthostatic hypotension. HCTZ on hold.   2. Elevated troponin - secondary to demand ischemia secondary to prolonged hypotension. No further ischemic inpatient workup planned at this time. Echo 7/27 showed LVEF 65-70%, moderate LVH, normal wall motion, diastolicdysfunction, indeterminate LV filling pressure, normal LA size.  3. Hypertension - Presented with hypotension, improved. Continue nebivolol in a lower initial dose of 5 mg daily. Stable BP.    4. Abnormal CT of abdomen raising question of rectal neoplasm. Further evaluation per primary service. Flexible sigmoidoscopy on 7/30 which showed ulceration/inflammation in the distal rectum-await biopsy results.  5. Hypomagnesemia - Mg of 1.6 today. supplement given.   6. Urinary retention - per primary 7.  Dyslipidemia - Vytorin on hold 8. DM - on SSI 9. Leukocytosis - per primary   Signed, Bhagat,Bhavinkumar PA-C Pager [phone]  Personally seen and examined. Agree with above. Pleasant, No CP, no SOB 2/6 SEM Syncope - mild 1st degree AVB only on tele. Orthostatic previously. Off HCTZ. Low dose bb.  PT for generalized weakness. Prior stroke.  Will follow.   [name], MD

## 2015-08-01 NOTE — Clinical Documentation Improvement (Signed)
Would you please help clarify the specific medical condition associated with noted clinical findings and documentation and whether ruled in or out? Rhabdomyolysis Unable to determine Other clinical explanation.  pt down time undetermined. In ED provider note and H&P - possible rhabdomyolysis noted with no further documentation.  Thank You, Harrie Jeans ,RN Clinical Documentation Specialist:    [facility]- Health Information Management [phone] Cell - [phone]

## 2015-08-01 NOTE — Progress Notes (Signed)
Physical Therapy Treatment Patient Details Name: Dana Blair MRN: [id number] DOB: 11-Nov-1941 Today's Date: 08/01/2015    History of Present Illness Dana Blair is a 74 y.o. female who is brought in by son after being found down at home. Had generalized weakness onset 2 days ago. At baseline is ambulatory and lives alone. Patients son found mother on floor at 6pm tonight, unclear how long she was down for. Patient states she was dizzy before she fell, denies hitting head, denies LOC.  Found to be significantly dehydrated.    PT Comments    Pt making great progress in therapy and was able to ambulate full distance of hallway with and without RW.  Min/guard to min A needed with or without use of RW, however feel she will be safer initially with use of RW.  Spoke with son and pt regarding D/C plan.  Feel that pt will likely benefit from 24/7 S initially, son unable to provide this.  Recommend continue acute services to address deficits and recommend ST SNF placement for follow up.    Follow Up Recommendations  Home health PT;SNF;Supervision/Assistance - 24 hour     Equipment Recommendations  Rolling walker with 5" wheels    Recommendations for Other Services       Precautions / Restrictions Precautions Precautions: Fall Restrictions Weight Bearing Restrictions: No    Mobility  Bed Mobility Overal bed mobility: Modified Independent Bed Mobility: Rolling;Sidelying to Sit Rolling: Modified independent (Device/Increase time) Sidelying to sit: Modified independent (Device/Increase time)       General bed mobility comments: Pt with good recall of log rolling technique and able to perform with HOB flat and without rail to better simulate home.   Transfers Overall transfer level: Needs assistance Equipment used: Rolling walker (2 wheeled) Transfers: Sit to/from Stand Sit to Stand: Min guard         General transfer comment: Min cues for hand placement and min/guard  for safety and to steady.   Ambulation/Gait Ambulation/Gait assistance: Min assist;Min guard Ambulation Distance (Feet): 200 Feet Assistive device: Rolling walker (2 wheeled) Gait Pattern/deviations: Decreased stride length;Step-through pattern;Trunk flexed     General Gait Details: Pt able to ambulate full distance of hallway with use of RW at min/guard assist with min cues for safety with RW.    Stairs            Wheelchair Mobility    Modified Rankin (Stroke Patients Only)       Balance Overall balance assessment: Needs assistance Sitting-balance support: Feet supported Sitting balance-Leahy Scale: Good     Standing balance support: During functional activity;No upper extremity supported Standing balance-Leahy Scale: Poor Standing balance comment: Pt did ambulate some without use of AD at min/guard to min A level, no overt LOB                    Cognition Arousal/Alertness: Awake/alert Behavior During Therapy: WFL for tasks assessed/performed Overall Cognitive Status: Within Functional Limits for tasks assessed                      Exercises      General Comments        Pertinent Vitals/Pain Pain Assessment: No/denies pain    Home Living                      Prior Function            PT Goals (current goals  can now be found in the care plan section) Acute Rehab PT Goals Patient Stated Goal: back home PT Goal Formulation: With patient/family Time For Goal Achievement: 08/05/15 Potential to Achieve Goals: Good Progress towards PT goals: Progressing toward goals    Frequency  Min 3X/week    PT Plan Current plan remains appropriate    Co-evaluation             End of Session Equipment Utilized During Treatment: Gait belt Activity Tolerance: Patient tolerated treatment well Patient left: in chair;with call bell/phone within reach     Time: 1610-9604 PT Time Calculation (min) (ACUTE ONLY): 26 min  Charges:   $Gait Training: 23-37 mins                    G Codes:      Vista Deck 08/01/2015, 3:13 PM

## 2015-08-01 NOTE — Progress Notes (Signed)
Inpatient Diabetes Program Recommendations  AACE/ADA: New Consensus Statement on Inpatient Glycemic Control (2013)  Target Ranges:  Prepandial:   less than 140 mg/dL      Peak postprandial:   less than 180 mg/dL (1-2 hours)      Critically ill patients:  140 - 180 mg/dL   Results for JESSECA, MARSCH (MRN [id number]) as of 08/01/2015 08:53  Ref. Range 07/31/2015 07:33 07/31/2015 11:32 07/31/2015 16:59 07/31/2015 20:28 08/01/2015 00:12 08/01/2015 04:19 08/01/2015 07:16  Glucose-Capillary Latest Ref Range: 65-99 mg/dL 782 (H) 956 (H) 213 (H) 215 (H) 218 (H) 181 (H) 165 (H)   Reason for Admission: Weakness/Fall  Diabetes history: DM 2 Outpatient Diabetes medications: Janumet 50-1,000 mg BID Current orders for Inpatient glycemic control: Novolog Sensitive Q 4hrs.  Inpatient Diabetes Program Recommendations Correction (SSI): Please consider increasing correction to Novolog Moderate TID and add HS scale if patient is eating. Glucose increases with meals. Diet: Please change Ensure to Glucerna. Please change regular diet to carb modified. Glucose levels spike after supplement.  Thanks,  Christena Deem RN, MSN, [facility] Inpatient Diabetes Coordinator Team Pager [phone]

## 2015-08-02 ENCOUNTER — Encounter (HOSPITAL_COMMUNITY): Payer: Self-pay | Admitting: Gastroenterology

## 2015-08-02 DIAGNOSIS — I517 Cardiomegaly: Secondary | ICD-10-CM

## 2015-08-02 LAB — MAGNESIUM: Magnesium: 1.7 mg/dL (ref 1.7–2.4)

## 2015-08-02 LAB — GLUCOSE, CAPILLARY
GLUCOSE-CAPILLARY: 173 mg/dL — AB (ref 65–99)
Glucose-Capillary: 186 mg/dL — ABNORMAL HIGH (ref 65–99)
Glucose-Capillary: 178 mg/dL — ABNORMAL HIGH (ref 65–99)
Glucose-Capillary: 225 mg/dL — ABNORMAL HIGH (ref 65–99)

## 2015-08-02 LAB — BASIC METABOLIC PANEL
ANION GAP: 9 (ref 5–15)
BUN: 12 mg/dL (ref 6–20)
CALCIUM: 8.3 mg/dL — AB (ref 8.9–10.3)
CO2: 28 mmol/L (ref 22–32)
Chloride: 101 mmol/L (ref 101–111)
Creatinine, Ser: 0.7 mg/dL (ref 0.44–1.00)
GFR calc non Af Amer: >60 mL/min (ref >60)
GLUCOSE: 204 mg/dL — AB (ref 65–99)
Potassium: 3.3 mmol/L — ABNORMAL LOW (ref 3.5–5.1)
Sodium: 138 mmol/L (ref 135–145)

## 2015-08-02 MED ORDER — NEBIVOLOL HCL 5 MG PO TABS: 5.0000 mg | ORAL_TABLET | Freq: Every day | ORAL | Status: DC

## 2015-08-02 MED ORDER — ENSURE ENLIVE PO LIQD: 237.0000 mL | Freq: Three times a day (TID) | ORAL | Status: DC

## 2015-08-02 NOTE — Progress Notes (Signed)
Patient Name: KARLITA LICHTMAN Date of Encounter: 08/02/2015  Principal Problem:  Syncope and collapse Active Problems:  Essential hypertension  DM2 (diabetes mellitus, type 2)  AKI (acute kidney injury)  Dehydration  Occult blood positive stool  Chronic diastolic congestive heart failure  LVH (left ventricular hypertrophy)  Orthostatic hypotension  Lactic acidosis  Severe protein-calorie malnutrition  SUBJECTIVE  Feels better. Denies chest pain, sob or palpitation.   CURRENT MEDS . aspirin EC  81 mg Oral Daily  . cefTRIAXone (ROCEPHIN)  IV  1 g Intravenous Q24H  . feeding supplement (ENSURE ENLIVE)  237 mL Oral TID BM  . insulin aspart  0-9 Units Subcutaneous 6 times per day  . nebivolol  5 mg Oral Daily    OBJECTIVE  Filed Vitals:   08/01/15 0500 08/01/15 1354 08/01/15 2041 08/02/15 0500  BP: 145/78 148/62 158/77 153/77  Pulse: 69 69 68 73  Temp: 98.7 F (37.1 C) 99.1 F (37.3 C) 98.7 F (37.1 C) 98.6 F (37 C)  TempSrc:  Oral    Resp: 15 15 14 13   Height:      Weight: 119 lb 14.4 oz (54.386 kg)   117 lb 14.4 oz (53.479 kg)  SpO2: 98% 97% 99% 97%    Intake/Output Summary (Last 24 hours) at 08/02/15 0917 Last data filed at 08/02/15 0830  Gross per 24 hour  Intake    700 ml  Output      0 ml  Net    700 ml   Filed Weights   07/31/15 0500 08/01/15 0500 08/02/15 0500  Weight: 110 lb 4.8 oz (50.032 kg) 119 lb 14.4 oz (54.386 kg) 117 lb 14.4 oz (53.479 kg)    PHYSICAL EXAM  General: Pleasant, NAD. Neuro: Alert and oriented X 3. Moves all extremities spontaneously. Psych: Normal affect. HEENT:  Normal  Neck: Supple without bruits or JVD. Lungs:  Resp regular and unlabored, CTA. Heart: RRR no s3, s4,  + 2/6 SEM. Abdomen: Soft, non-tender, non-distended, BS + x 4.  Extremities: No clubbing, cyanosis or edema. DP/PT/Radials 2+ and equal bilaterally.  Accessory Clinical Findings  CBC No results for input(s): WBC, NEUTROABS, HGB, HCT, MCV,  PLT in the last 72 hours. Basic Metabolic Panel  Recent Labs  07/31/15 0318 08/01/15 0345 08/02/15 0427  NA 140  --  138  K 3.9  --  3.3*  CL 105  --  101  CO2 27  --  28  GLUCOSE 178*  --  204*  BUN 24*  --  12  CREATININE 0.71  --  0.70  CALCIUM 8.4*  --  8.3*  MG 1.7 1.6* 1.7    TELE  NSR at rate of 60s. Mild 1st degree AVB.   Radiology/Studies  Ct Abdomen Pelvis Wo Contrast  07/28/2015   CLINICAL DATA:  Found unresponsive on the floor.  EXAM: CT ABDOMEN AND PELVIS WITHOUT CONTRAST  TECHNIQUE: Multidetector CT imaging of the abdomen and pelvis was performed following the standard protocol without IV contrast.  COMPARISON:  None.  FINDINGS: Atheromatous calcifications, including the coronary arteries. Small amount of linear atelectasis or scarring at both lung bases. Small liver cysts. Distended urinary bladder. Right posterior bladder diverticulum or loculated free peritoneal fluid. Left ovarian coarse calcification and 2.2 cm cyst. Surgically absent uterus.  Concentric inferior rectal wall thickening with irregular luminal narrowing. No bowel dilatation. No enlarged lymph nodes. Mild to moderate dilatation of both renal collecting systems, or proximal ureters and mid ureters. No obstructing  mass or calculi seen.  No findings suspicious for appendicitis. Unremarkable non contrasted appearance of the spleen, pancreas, gallbladder and adrenal glands. Mild lumbar spine degenerative changes. 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  IMPRESSION: 1. Diffuse, concentric inferior rectal wall thickening with irregular luminal narrowing. This is suspicious for a rectal neoplasm. Proctitis is less likely. 2. Mild to moderate bilateral hydronephrosis and hydroureter, most likely due to bladder distention. 3. 2.2 cm left ovarian cyst. This is almost certainly benign, but follow up ultrasound is recommended in 1 year according to the Society of Radiologists in Ultrasound  2010 Consensus Conference Statement (D Lenis Noon et al. Management of Asymptomatic Ovarian and Other Adnexal Cysts Imaged at [place]: Society of Radiologists in Ultrasound Consensus Conference Statement 2010. Radiology 256 (Sept 2010): 943-954.). 4. Right posterior bladder diverticulum or loculated fluid. An ovarian cyst is less likely.   Electronically Signed   By: Beckie Salts M.D.   On: 07/28/2015 15:56   Ct Head Wo Contrast  07/27/2015   CLINICAL DATA:  Weakness and dizziness.  Hypotension.  EXAM: CT HEAD WITHOUT CONTRAST  TECHNIQUE: Contiguous axial images were obtained from the base of the skull through the vertex without intravenous contrast.  COMPARISON:  None.  FINDINGS: Chronic ischemic changes in the periventricular white matter. Mild global atrophy appropriate to age. No mass effect, midline shift, or acute intracranial hemorrhage. Mastoid air cells are clear. Minimal bubbly mucous material in the left sphenoid sinus. Visualized paranasal sinuses are otherwise clear. Cranium is intact.  IMPRESSION: No acute intracranial pathology.  Chronic changes are noted.   Electronically Signed   By: Jolaine Click M.D.   On: 07/27/2015 11:32   Mr Maxine Glenn Head Wo Contrast  07/27/2015   CLINICAL DATA:  Syncope. Found down. Increased weakness the last 2 days. Prior stroke.  EXAM: MRI HEAD WITHOUT CONTRAST  MRA HEAD WITHOUT CONTRAST  TECHNIQUE: Multiplanar, multiecho pulse sequences of the brain and surrounding structures were obtained without intravenous contrast. Angiographic images of the head were obtained using MRA technique without contrast.  COMPARISON:  Head CT 07/27/2015  FINDINGS: MRI HEAD FINDINGS  There is no evidence of acute infarct, mass, midline shift, or extra-axial fluid collection. There is mild generalized cerebral atrophy. Patchy and confluent T2 hyperintensities involving the deep greater than subcortical cerebral white matter bilaterally and pons are nonspecific but compatible with moderate chronic small  vessel ischemic disease. There may be 1 or 2 tiny chronic infarcts in the cerebellum.  There is a chronic hemorrhagic infarct involving the body of the right caudate nucleus and internal capsule. Numerous additional T2 hyperintense foci throughout the basal ganglia likely represent dilated perivascular spaces. Chronic lacunar infarcts and microhemorrhages are noted in the thalami.  Prior bilateral cataract extraction is noted. Paranasal sinuses and mastoid air cells are clear. Major intracranial vascular flow voids are preserved.  MRA HEAD FINDINGS  The visualized distal vertebral arteries are patent with the right being dominant. PICA origins are patent. Right AICA origin is patent. SCA origins are patent. Basilar artery is patent without stenosis. There is a patent left posterior communicating artery. PCAs are patent with mild narrowing of the left P1 segment, although this may be at least partly developmental given the presence of a medium-sized ipsilateral posterior communicating artery. There is mild PCA branch vessel irregularity bilaterally.  Internal carotid arteries are patent from skullbase to carotid termini without stenosis. Anterior communicating artery is patent. Proximal A1 stenoses are moderate to severe on the  right and mild on the left. MCAs are patent with mild-to-moderate branch vessel irregularity but no evidence of significant proximal stenosis. No intracranial aneurysm is identified.  IMPRESSION: 1. No acute intracranial abnormality. 2. Moderate chronic small vessel ischemic disease. 3. 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 4. 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 5. No evidence of major intracranial arterial occlusion. 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   Electronically Signed   By: Sebastian Ache   On: 07/27/2015 11:50   Mr Brain Wo Contrast  07/27/2015   CLINICAL DATA:  Syncope. Found down. Increased weakness the last 2 days. Prior stroke.   EXAM: MRI HEAD WITHOUT CONTRAST  MRA HEAD WITHOUT CONTRAST  TECHNIQUE: Multiplanar, multiecho pulse sequences of the brain and surrounding structures were obtained without intravenous contrast. Angiographic images of the head were obtained using MRA technique without contrast.  COMPARISON:  Head CT 07/27/2015  FINDINGS: MRI HEAD FINDINGS  There is no evidence of acute infarct, mass, midline shift, or extra-axial fluid collection. There is mild generalized cerebral atrophy. Patchy and confluent T2 hyperintensities involving the deep greater than subcortical cerebral white matter bilaterally and pons are nonspecific but compatible with moderate chronic small vessel ischemic disease. There may be 1 or 2 tiny chronic infarcts in the cerebellum.  There is a chronic hemorrhagic infarct involving the body of the right caudate nucleus and internal capsule. Numerous additional T2 hyperintense foci throughout the basal ganglia likely represent dilated perivascular spaces. Chronic lacunar infarcts and microhemorrhages are noted in the thalami.  Prior bilateral cataract extraction is noted. Paranasal sinuses and mastoid air cells are clear. Major intracranial vascular flow voids are preserved.  MRA HEAD FINDINGS  The visualized distal vertebral arteries are patent with the right being dominant. PICA origins are patent. Right AICA origin is patent. SCA origins are patent. Basilar artery is patent without stenosis. There is a patent left posterior communicating artery. PCAs are patent with mild narrowing of the left P1 segment, although this may be at least partly developmental given the presence of a medium-sized ipsilateral posterior communicating artery. There is mild PCA branch vessel irregularity bilaterally.  Internal carotid arteries are patent from skullbase to carotid termini without stenosis. Anterior communicating artery is patent. Proximal A1 stenoses are moderate to severe on the right and mild on the left. MCAs are  patent with mild-to-moderate branch vessel irregularity but no evidence of significant proximal stenosis. No intracranial aneurysm is identified.  IMPRESSION: 1. No acute intracranial abnormality. 2. Moderate chronic small vessel ischemic disease. 3. 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 4. 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 5. No evidence of major intracranial arterial occlusion. 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   Electronically Signed   By: Sebastian Ache   On: 07/27/2015 11:50   US Renal  08/01/2015   CLINICAL DATA:  Reassess hydronephrosis  EXAM: RENAL / URINARY TRACT ULTRASOUND COMPLETE  COMPARISON:  07/28/2015  FINDINGS: Right Kidney:  Length: 10.3 cm.  Moderate hydronephrosis  Left Kidney:  Length: 9.9 cm.  Moderate hydronephrosis  Bladder:  Bladder is distended.  Bilateral ureteral jets identified.  IMPRESSION: Similar to prior CT scan, the bladder is distended and there is moderate bilateral hydronephrosis.   Electronically Signed   By: Esperanza Heir M.D.   On: 08/01/2015 20:58   Dg Chest Portable 1 View  07/26/2015   CLINICAL DATA:  Mallet pain for 1 week.  Hypotension today.  EXAM:  PORTABLE CHEST - 1 VIEW  COMPARISON:  None.  FINDINGS: The lungs are clear. Heart size is normal. No pneumothorax or pleural effusion.  IMPRESSION: No acute disease.   Electronically Signed   By: Drusilla Kanner M.D.   On: 07/26/2015 19:15    ASSESSMENT AND PLAN   74 year old woman found down at home by her son on 07/26/15. She has a past history of hypertension and mild valvular heart disease. No history of ischemic heart disease.  1. Syncope and collapse - Likely secondary to dehydration and orthostatic hypotension. HCTZ on hold.  - mild 1st degree block on tele. Low dose BB.   2. Elevated troponin - secondary to demand ischemia secondary to prolonged hypotension. No further ischemic inpatient workup planned at this time. Echo 7/27 showed LVEF 65-70%,  moderate LVH, normal wall motion, diastolicdysfunction, indeterminate LV filling pressure, normal LA size.  3. Hypertension - Presented with hypotension, improved. Continue nebivolol in a lower initial dose of 5 mg daily. Stable BP.    4. Abnormal CT of abdomen raising question of rectal neoplasm. Further evaluation per primary service. Flexible sigmoidoscopy on 7/30 which showed ulceration/inflammation in the distal rectum-await biopsy results.  5. Hypomagnesemia - Resolved  6. Urinary retention - per primary 7. Dyslipidemia - Vytorin on hold 8. DM - on SSI 9. Leukocytosis - per primary  10. Hypokalemia - K of 3.3. Will give supplement.    Signed, Manson Passey PA-C Pager [phone]     Personally seen and examined. Agree with above. No new cardiac issues. Normal EF. Murmur from outflow tract.  Rectal mass is benign.  She has follow up with me soon.  Will sign off.   [name], MD

## 2015-08-02 NOTE — Progress Notes (Signed)
Awaiting rectal ulcer biopsy results.  Nothing else to add at this time.

## 2015-08-02 NOTE — Consult Note (Signed)
   [facility] CM Inpatient Consult   08/02/2015  Dana Blair Apr 03, 1941 [id number]   Received referral from inpatient RNCM for [facility] Care Management services. Cross checked for eligibility. Unfortunately, patient not eligible for [facility] Care Management services at this time as she is not a member of a [facility] risk affiliated contract. Made inpatient RNCM aware.  Raiford Noble, MSN-Ed, RN,BSN [facility] Liaison [phone]

## 2015-08-02 NOTE — Progress Notes (Addendum)
Physical Therapy Treatment Patient Details Name: Dana Blair MRN: [id number] DOB: [birth year] Today's Date: 08/02/2015    History of Present Illness Dana Blair is a 74 y.o. female who is brought in by son after being found down at home. Had generalized weakness onset 2 days ago. At baseline is ambulatory and lives alone. Patients son found mother on floor at 6pm tonight, unclear how long she was down for. Patient states she was dizzy before she fell, denies hitting head, denies LOC.  Found to be significantly dehydrated.    PT Comments    Pt continues to make good progress with all aspects of therapy.  She was able to perform 3 steps today with use of R rail at min A level.  Pt states no rail in front or back, therefore would recommend she perform single step with RW to get into/out of house with assist from son.  Continue to recommend 24/7 S at time of D/C, therefore recommend SNF, however pt may be too high level at this time.   Follow Up Recommendations  Home health PT;SNF;Supervision/Assistance - 24 hour     Equipment Recommendations  Rolling walker with 5" wheels    Recommendations for Other Services       Precautions / Restrictions Precautions Precautions: Fall Restrictions Weight Bearing Restrictions: No    Mobility  Bed Mobility Overal bed mobility: Modified Independent Bed Mobility: Rolling;Sidelying to Sit Rolling: Modified independent (Device/Increase time) Sidelying to sit: Modified independent (Device/Increase time)       General bed mobility comments: Pt with good recall of log rolling technique and able to perform with HOB flat and without rail to better simulate home.   Transfers Overall transfer level: Needs assistance Equipment used: Rolling walker (2 wheeled) Transfers: Sit to/from Stand Sit to Stand: Supervision         General transfer comment: Cues for hand placement when sitting/standing.   Ambulation/Gait Ambulation/Gait  assistance: Supervision Ambulation Distance (Feet): 250 Feet Assistive device: Rolling walker (2 wheeled) Gait Pattern/deviations: Decreased stride length;Step-through pattern;Trunk flexed     General Gait Details: Pt continues to progress with ambulation distance and feel she will be safer with use of RW with min cues for steering with RW.    Stairs Stairs: Yes Stairs assistance: Min assist Stair Management: One rail Right;Step to pattern;Forwards Number of Stairs: 4 General stair comments: Cues for step to pattern for increased safety.   Wheelchair Mobility    Modified Rankin (Stroke Patients Only)       Balance Overall balance assessment: Needs assistance Sitting-balance support: Feet supported Sitting balance-Leahy Scale: Good     Standing balance support: During functional activity;No upper extremity supported Standing balance-Leahy Scale: Fair Standing balance comment: Pt abe to stand at sink without UE support with forward and lateral weight shifts at S level.                     Cognition Arousal/Alertness: Awake/alert Behavior During Therapy: WFL for tasks assessed/performed Overall Cognitive Status: Within Functional Limits for tasks assessed                      Exercises      General Comments        Pertinent Vitals/Pain Pain Assessment: No/denies pain    Home Living                      Prior Function  PT Goals (current goals can now be found in the care plan section) Acute Rehab PT Goals Patient Stated Goal: back home PT Goal Formulation: With patient/family Time For Goal Achievement: 08/05/15 Potential to Achieve Goals: Good Progress towards PT goals: Progressing toward goals    Frequency  Min 3X/week    PT Plan Current plan remains appropriate    Co-evaluation             End of Session Equipment Utilized During Treatment: Gait belt Activity Tolerance: Patient tolerated treatment  well Patient left: in chair;with call bell/phone within reach     Time: 0845-0910 PT Time Calculation (min) (ACUTE ONLY): 25 min  Charges:  $Gait Training: 8-22 mins $Therapeutic Activity: 8-22 mins                    G Codes:      Dana Blair 08/02/2015, 9:53 AM

## 2015-08-02 NOTE — Care Management Note (Signed)
Case Management Note CM noted started by Tomi Bamberger RNCM  Patient Details  Name: Dana Blair MRN: [id number] Date of Birth: 04/03/1941  Subjective/Objective: Pt admitted for Fall and weakness. Pt is from home with family support of son. However, son works during the day long hours. Pt has no DME @ home. Per daughter she had been pretty independent.    Action/Plan: CM did speak with daughter in regards to disposition needs. Per daughter she would like to keep the pt in the home as long as possible since it is familiar to pt. CM will provide to daughter list of Personal Care Services that family will have to pay out of pocket. CM will also provide information for life alert button. PT /OT to consult for additional recommendations. CM will place a [facility] consult for pt as well to see if they can f/u in the community. CM will continue to monitor for additional disposition needs.   Expected Discharge Date:      08/02/15            Expected Discharge Plan:  Home w Home Health Services  In-House Referral:  [facility] Discharge planning Services  CM Consult  Post Acute Care Choice:  NA, Durable Medical Equipment Choice offered to:  Patient, Adult Children  DME Arranged:  Walker rolling DME Agency:  [facility]  HH Arranged:  RN, PT, Nurse's Aide HH Agency:  [facility]  Status of Service:  Completed, signed off  Medicare Important Message Given:  Yes-third notification given Date Medicare IM Given:   08/01/15 Medicare IM give by:    Date Additional Medicare IM Given:    Additional Medicare Important Message give by:     If discussed at Long Length of Stay Meetings, dates discussed:  08/02/15  08/02/15- Donn Pierini RN, BSN [phone] Pt for d/c home today- spoke with pt, son and daughter - per conversation family interested in STSNF option due to pt not having 24/7 supervision discussion had regarding PT eval and that pt was functioning at too high  level for insurance to approve SNF for rehab- discussed option of private pay aide- family would like list of agencies- list provided of private duty agencies for family to f/u with- per family choice HH services have been arranged with [facility]- referral call to Lupita Leash with [facility] regarding pt's pending d/c for today- RW also has been ordered- spoke with Jermaine with [facility] -to deliver to room prior to discharge (pt already has BSC at home).   Additional Comments: 07-29-15 [address] Tomi Bamberger, RN,BSN [phone] CM did speak with pt and daughter both agreeable to [facility] to provide services listed above. CM made referral with [facility] and [facility] to begin within 24-48 hrs post d/c. [facility] will not be able to f/u outpatient. Daughter has PCS list and she is aware in order for services to be set up family will have to call. No further needs from CM at this time.   Darrold Span, RN 08/02/2015, 3:07 PM

## 2015-08-02 NOTE — Discharge Summary (Addendum)
PATIENT DETAILS Name: Dana Blair Age: 74 y.o. Sex: female Date of Birth: 09-21-1941 MRN: [id number]. Admitting Physician: Hillary Bow, DO PCP:SUN,VYVYAN Y, MD  Admit Date: 07/26/2015 Discharge date: 08/02/2015  Recommendations for Outpatient Follow-up:  1. Please refer to Urology for Urodynamic testing. 2. Being discharged with Foley-outpatient voiding trial 3. Please repeat CBC/BMET at next visit 4. 2.2 cm left ovarian cyst. Per radiology tis is almost certainly benign, but follow up ultrasound is recommended in 1 year   PRIMARY DISCHARGE DIAGNOSIS:  Principal Problem:   Syncope and collapse Active Problems:   Essential hypertension   DM2 (diabetes mellitus, type 2)   AKI (acute kidney injury)   Dehydration   Occult blood positive stool   Chronic diastolic congestive heart failure   LVH (left ventricular hypertrophy)   Orthostatic hypotension   Lactic acidosis   Severe protein-calorie malnutrition   Hydronephrosis   Leukocytosis      PAST MEDICAL HISTORY: Past Medical History  Diagnosis Date  . Diabetes     Diabetes mellitus with chronic kidney disease   . Hypertension     [facility]- ECHO 06/29/09 - LVH, normal EF, renal ultrasound-no renal artery stenosis  . Hyperlipidemia   . 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 She had concomitant respiratory failure. On May 22, 2008, CT of the head without contrast demonstrated a 9 x 20 mm acute hematoma within the head of the right caudate with intraventricular extension of hemorrhage. Neurosurgery consult-no ventriculostomy.  . Glaucoma   . Osteopenia   . Chronic kidney disease   . Syncope 07/26/2015    DISCHARGE MEDICATIONS: Current Discharge Medication List    START taking these medications   Details  feeding supplement, ENSURE ENLIVE, (ENSURE ENLIVE) LIQD Take 237 mLs by mouth 3 (three) times daily between meals. Qty: 90 Bottle, Refills: 0      CONTINUE these medications which have CHANGED   Details  nebivolol (BYSTOLIC) 5 MG tablet Take 1 tablet (5 mg total) by mouth daily. Qty: 30 tablet, Refills: 0      CONTINUE these medications which have NOT CHANGED   Details  alendronate (FOSAMAX) 70 MG tablet Take 70 mg by mouth once a week. Take with a full glass of water on an empty stomach.    aspirin 81 MG tablet Take 81 mg by mouth daily.    ezetimibe-simvastatin (VYTORIN) 10-20 MG per tablet Take 1 tablet by mouth daily. Qty: 30 tablet, Refills: 1    sitaGLIPtin-metformin (JANUMET) 50-1000 MG per tablet Take 1 tablet by mouth 2 (two) times daily with a meal.      STOP taking these medications     amLODipine (NORVASC) 10 MG tablet      cloNIDine (CATAPRES) 0.1 MG tablet      lisinopril-hydrochlorothiazide (PRINZIDE) 20-12.5 MG per tablet         ALLERGIES:  No Known Allergies  BRIEF HPI:  See H&P, Labs, Consult and Test reports for all details in brief, patient is a 74 y.o. female who was brought to the hospital after being found down at home. She was found to be orhtostatic, and admitted for further evaluation and treatment.   CONSULTATIONS:   cardiology and GI  PERTINENT RADIOLOGIC STUDIES: Ct Abdomen Pelvis Wo Contrast  07/28/2015   CLINICAL DATA:  Found unresponsive on the floor.  EXAM: CT ABDOMEN AND PELVIS WITHOUT CONTRAST  TECHNIQUE: Multidetector CT imaging of the abdomen and pelvis was performed following the  standard protocol without IV contrast.  COMPARISON:  None.  FINDINGS: Atheromatous calcifications, including the coronary arteries. Small amount of linear atelectasis or scarring at both lung bases. Small liver cysts. Distended urinary bladder. Right posterior bladder diverticulum or loculated free peritoneal fluid. Left ovarian coarse calcification and 2.2 cm cyst. Surgically absent uterus.  Concentric inferior rectal wall thickening with irregular luminal narrowing. No bowel dilatation. No enlarged lymph nodes. Mild to moderate dilatation of both renal  collecting systems, or proximal ureters and mid ureters. No obstructing mass or calculi seen.  No findings suspicious for appendicitis. Unremarkable non contrasted appearance of the spleen, pancreas, gallbladder and adrenal glands. Mild lumbar spine degenerative changes. 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  IMPRESSION: 1. Diffuse, concentric inferior rectal wall thickening with irregular luminal narrowing. This is suspicious for a rectal neoplasm. Proctitis is less likely. 2. Mild to moderate bilateral hydronephrosis and hydroureter, most likely due to bladder distention. 3. 2.2 cm left ovarian cyst. This is almost certainly benign, but follow up ultrasound is recommended in 1 year according to the Society of Radiologists in Ultrasound 2010 Consensus Conference Statement (D Lenis Noon et al. Management of Asymptomatic Ovarian and Other Adnexal Cysts Imaged at [place]: Society of Radiologists in Ultrasound Consensus Conference Statement 2010. Radiology 256 (Sept 2010): 943-954.). 4. Right posterior bladder diverticulum or loculated fluid. An ovarian cyst is less likely.   Electronically Signed   By: Beckie Salts M.D.   On: 07/28/2015 15:56   Ct Head Wo Contrast  07/27/2015   CLINICAL DATA:  Weakness and dizziness.  Hypotension.  EXAM: CT HEAD WITHOUT CONTRAST  TECHNIQUE: Contiguous axial images were obtained from the base of the skull through the vertex without intravenous contrast.  COMPARISON:  None.  FINDINGS: Chronic ischemic changes in the periventricular white matter. Mild global atrophy appropriate to age. No mass effect, midline shift, or acute intracranial hemorrhage. Mastoid air cells are clear. Minimal bubbly mucous material in the left sphenoid sinus. Visualized paranasal sinuses are otherwise clear. Cranium is intact.  IMPRESSION: No acute intracranial pathology.  Chronic changes are noted.   Electronically Signed   By: Jolaine Click M.D.   On: 07/27/2015 11:32   Mr Maxine Glenn Head  Wo Contrast  07/27/2015   CLINICAL DATA:  Syncope. Found down. Increased weakness the last 2 days. Prior stroke.  EXAM: MRI HEAD WITHOUT CONTRAST  MRA HEAD WITHOUT CONTRAST  TECHNIQUE: Multiplanar, multiecho pulse sequences of the brain and surrounding structures were obtained without intravenous contrast. Angiographic images of the head were obtained using MRA technique without contrast.  COMPARISON:  Head CT 07/27/2015  FINDINGS: MRI HEAD FINDINGS  There is no evidence of acute infarct, mass, midline shift, or extra-axial fluid collection. There is mild generalized cerebral atrophy. Patchy and confluent T2 hyperintensities involving the deep greater than subcortical cerebral white matter bilaterally and pons are nonspecific but compatible with moderate chronic small vessel ischemic disease. There may be 1 or 2 tiny chronic infarcts in the cerebellum.  There is a chronic hemorrhagic infarct involving the body of the right caudate nucleus and internal capsule. Numerous additional T2 hyperintense foci throughout the basal ganglia likely represent dilated perivascular spaces. Chronic lacunar infarcts and microhemorrhages are noted in the thalami.  Prior bilateral cataract extraction is noted. Paranasal sinuses and mastoid air cells are clear. Major intracranial vascular flow voids are preserved.  MRA HEAD FINDINGS  The visualized distal vertebral arteries are patent with the right being dominant. PICA origins are  patent. Right AICA origin is patent. SCA origins are patent. Basilar artery is patent without stenosis. There is a patent left posterior communicating artery. PCAs are patent with mild narrowing of the left P1 segment, although this may be at least partly developmental given the presence of a medium-sized ipsilateral posterior communicating artery. There is mild PCA branch vessel irregularity bilaterally.  Internal carotid arteries are patent from skullbase to carotid termini without stenosis. Anterior  communicating artery is patent. Proximal A1 stenoses are moderate to severe on the right and mild on the left. MCAs are patent with mild-to-moderate branch vessel irregularity but no evidence of significant proximal stenosis. No intracranial aneurysm is identified.  IMPRESSION: 1. No acute intracranial abnormality. 2. Moderate chronic small vessel ischemic disease. 3. 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 4. 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 5. No evidence of major intracranial arterial occlusion. 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   Electronically Signed   By: Sebastian Ache   On: 07/27/2015 11:50   Mr Brain Wo Contrast  07/27/2015   CLINICAL DATA:  Syncope. Found down. Increased weakness the last 2 days. Prior stroke.  EXAM: MRI HEAD WITHOUT CONTRAST  MRA HEAD WITHOUT CONTRAST  TECHNIQUE: Multiplanar, multiecho pulse sequences of the brain and surrounding structures were obtained without intravenous contrast. Angiographic images of the head were obtained using MRA technique without contrast.  COMPARISON:  Head CT 07/27/2015  FINDINGS: MRI HEAD FINDINGS  There is no evidence of acute infarct, mass, midline shift, or extra-axial fluid collection. There is mild generalized cerebral atrophy. Patchy and confluent T2 hyperintensities involving the deep greater than subcortical cerebral white matter bilaterally and pons are nonspecific but compatible with moderate chronic small vessel ischemic disease. There may be 1 or 2 tiny chronic infarcts in the cerebellum.  There is a chronic hemorrhagic infarct involving the body of the right caudate nucleus and internal capsule. Numerous additional T2 hyperintense foci throughout the basal ganglia likely represent dilated perivascular spaces. Chronic lacunar infarcts and microhemorrhages are noted in the thalami.  Prior bilateral cataract extraction is noted. Paranasal sinuses and mastoid air cells are clear. Major intracranial  vascular flow voids are preserved.  MRA HEAD FINDINGS  The visualized distal vertebral arteries are patent with the right being dominant. PICA origins are patent. Right AICA origin is patent. SCA origins are patent. Basilar artery is patent without stenosis. There is a patent left posterior communicating artery. PCAs are patent with mild narrowing of the left P1 segment, although this may be at least partly developmental given the presence of a medium-sized ipsilateral posterior communicating artery. There is mild PCA branch vessel irregularity bilaterally.  Internal carotid arteries are patent from skullbase to carotid termini without stenosis. Anterior communicating artery is patent. Proximal A1 stenoses are moderate to severe on the right and mild on the left. MCAs are patent with mild-to-moderate branch vessel irregularity but no evidence of significant proximal stenosis. No intracranial aneurysm is identified.  IMPRESSION: 1. No acute intracranial abnormality. 2. Moderate chronic small vessel ischemic disease. 3. 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 4. 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 5. No evidence of major intracranial arterial occlusion. 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   Electronically Signed   By: Sebastian Ache   On: 07/27/2015 11:50   US Renal  08/01/2015   CLINICAL DATA:  Reassess hydronephrosis  EXAM: RENAL / URINARY TRACT ULTRASOUND COMPLETE  COMPARISON:  07/28/2015  FINDINGS: Right Kidney:  Length: 10.3 cm.  Moderate hydronephrosis  Left Kidney:  Length: 9.9 cm.  Moderate hydronephrosis  Bladder:  Bladder is distended.  Bilateral ureteral jets identified.  IMPRESSION: Similar to prior CT scan, the bladder is distended and there is moderate bilateral hydronephrosis.   Electronically Signed   By: Esperanza Heir M.D.   On: 08/01/2015 20:58   Dg Chest Portable 1 View  07/26/2015   CLINICAL DATA:  Mallet pain for 1 week.  Hypotension today.   EXAM: PORTABLE CHEST - 1 VIEW  COMPARISON:  None.  FINDINGS: The lungs are clear. Heart size is normal. No pneumothorax or pleural effusion.  IMPRESSION: No acute disease.   Electronically Signed   By: Drusilla Kanner M.D.   On: 07/26/2015 19:15     PERTINENT LAB RESULTS: CBC: No results for input(s): WBC, HGB, HCT, PLT in the last 72 hours. CMET CMP     Component Value Date/Time   NA 138 08/02/2015 0427   K 3.3* 08/02/2015 0427   CL 101 08/02/2015 0427   CO2 28 08/02/2015 0427   GLUCOSE 204* 08/02/2015 0427   BUN 12 08/02/2015 0427   CREATININE 0.70 08/02/2015 0427   CALCIUM 8.3* 08/02/2015 0427   PROT 7.5 07/28/2015 0855   ALBUMIN 2.9* 07/28/2015 0855   AST 100* 07/28/2015 0855   ALT 152* 07/28/2015 0855   ALKPHOS 74 07/28/2015 0855   BILITOT 1.2 07/28/2015 0855   GFRNONAA >60 08/02/2015 0427   GFRAA >60 08/02/2015 0427    GFR Estimated Creatinine Clearance: 52.1 mL/min (by C-G formula based on Cr of 0.7). No results for input(s): LIPASE, AMYLASE in the last 72 hours. No results for input(s): CKTOTAL, CKMB, CKMBINDEX, TROPONINI in the last 72 hours. Invalid input(s): POCBNP No results for input(s): DDIMER in the last 72 hours. No results for input(s): HGBA1C in the last 72 hours. No results for input(s): CHOL, HDL, LDLCALC, TRIG, CHOLHDL, LDLDIRECT in the last 72 hours. No results for input(s): TSH, T4TOTAL, T3FREE, THYROIDAB in the last 72 hours.  Invalid input(s): FREET3 No results for input(s): VITAMINB12, FOLATE, FERRITIN, TIBC, IRON, RETICCTPCT in the last 72 hours. Coags: No results for input(s): INR in the last 72 hours.  Invalid input(s): PT Microbiology: Recent Results (from the past 240 hour(s))  Blood culture (routine x 2)     Status: None   Collection Time: 07/26/15  6:58 PM  Result Value Ref Range Status   Specimen Description BLOOD LEFT ANTECUBITAL  Final   Special Requests BOTTLES DRAWN AEROBIC AND ANAEROBIC 2CC  Final   Culture NO GROWTH 5 DAYS   Final   Report Status 07/31/2015 FINAL  Final  Blood culture (routine x 2)     Status: None   Collection Time: 07/26/15  7:43 PM  Result Value Ref Range Status   Specimen Description BLOOD LEFT HAND  Final   Special Requests BOTTLES DRAWN AEROBIC ONLY 3CC  Final   Culture NO GROWTH 5 DAYS  Final   Report Status 07/31/2015 FINAL  Final  Urine culture     Status: None   Collection Time: 07/26/15  8:23 PM  Result Value Ref Range Status   Specimen Description URINE, CATHETERIZED  Final   Special Requests NONE  Final   Culture 50,000 COLONIES/mL ESCHERICHIA COLI  Final   Report Status 07/29/2015 FINAL  Final   Organism ID, Bacteria ESCHERICHIA COLI  Final      Susceptibility   Escherichia coli - MIC*  AMPICILLIN <=2 SENSITIVE Sensitive     CEFAZOLIN <=4 SENSITIVE Sensitive     CEFTRIAXONE <=1 SENSITIVE Sensitive     CIPROFLOXACIN <=0.25 SENSITIVE Sensitive     GENTAMICIN <=1 SENSITIVE Sensitive     IMIPENEM <=0.25 SENSITIVE Sensitive     NITROFURANTOIN <=16 SENSITIVE Sensitive     TRIMETH/SULFA <=20 SENSITIVE Sensitive     AMPICILLIN/SULBACTAM <=2 SENSITIVE Sensitive     PIP/TAZO <=4 SENSITIVE Sensitive     * 50,000 COLONIES/mL ESCHERICHIA COLI  Clostridium Difficile by PCR (not at [facility])     Status: None   Collection Time: 07/28/15  2:56 PM  Result Value Ref Range Status   Toxigenic C Difficile by pcr NEGATIVE NEGATIVE Final     BRIEF HOSPITAL COURSE:  Syncope and collapse: Likely secondary to orthostatic hypotension. Treated with IV fluids, with resolution of orthostatics. Continue TED hose. Echo showed preserved EF.Cardiology consulted, no further recommendations at this time.   Active Problems: Acute renal failure: Resolved. Etiology felt to be multifactorial-from dehydration/diarrhea/lisinopril and HCTZ use. Follow electrolytes closely as outpatient.  Leukocytosis:Resolved. UA/chest x-ray negative for UTI/pneumonia. Blood cultures negative, urine culture on 7/26 positive  for 50,000 colonies of Escherichia coli-given bilateral hydronephrosis-covered IV Rocephin-although UA on same date negative. No need for Abx on discharge.   Urinary retention: Placed Foley catheter. Voiding trial successful-since prior CT abdomen showed distended bladder with bilateral hydronephrosis-however a  renal ultrasound was done post voiding trial to if hydronephroses had resolved.Unfortunately it was still present. Post void residual today was around 900 cc. This MD spoke with Urology Dr Ralene Ok recommended to place a foley catheter and have patient follow up with him in the clinic for urodynamic studies. Per Dr Berneice Heinrich such studies not done while inpatient. Will place Foley and discharge for Urology follow up and a voiding trial. Thankfully renal function continues to be normal.  Rectal thickening seen on CT scan abdomen: GI consultation placed. Given weight loss-high suspicion for malignancy. Underwent flexible sigmoidoscopy on 7/30 which showed ulceration/inflammation in the distal rectum-biopsy was negative for malignancy. Spoke with Dr Ronnald Collum did not recommend further investigations.   Chronic diastolic heart failure: Clinically compensated. Continue Bystolic  Hypokalemia: Suspect secondary to GI loss. Repleted, recheck electrolytes periodically as outpatient.   Mildly elevated troponins: Not in the pattern of ACS-likely demand ischemia-however given ST changes and EKG -consulted cardiology. 2-D echocardiogram shows no regional wall motion abnormality.No further recommendations from cardiology  Essential hypertension: Controlled with Bystolic. Follow closely  Type 2 diabetes: CBGs controlled with SSI. Resume oral hypoglycemics on discharge  Dyslipidemia: resume Vytorin on discharge.  2.2 cm left ovarian cyst: Per readiology tis is almost certainly benign, but follow up ultrasound is recommended in 1 year   Severe malnutrition: Continue supplements  TODAY-DAY OF  DISCHARGE:  Subjective:   Adajah Cocking today has no headache,no chest abdominal pain,no new weakness tingling or numbness, feels much better wants to go home today.   Objective:   Blood pressure 153/77, pulse 73, temperature 98.6 F (37 C), temperature source Oral, resp. rate 13, height 5\' 4"  (1.626 m), weight 53.479 kg (117 lb 14.4 oz), SpO2 97 %.  Intake/Output Summary (Last 24 hours) at 08/02/15 1512 Last data filed at 08/02/15 1349  Gross per 24 hour  Intake    460 ml  Output    975 ml  Net   -515 ml   Filed Weights   07/31/15 0500 08/01/15 0500 08/02/15 0500  Weight: 50.032 kg (110 lb 4.8 oz) 54.386  kg (119 lb 14.4 oz) 53.479 kg (117 lb 14.4 oz)    Exam Awake Alert, Oriented *3, No new F.N deficits, Normal affect Advance.AT,PERRAL Supple Neck,No JVD, No cervical lymphadenopathy appriciated.  Symmetrical Chest wall movement, Good air movement bilaterally, CTAB RRR,No Gallops,Rubs or new Murmurs, No Parasternal Heave +ve B.Sounds, Abd Soft, Non tender, No organomegaly appriciated, No rebound -guarding or rigidity. No Cyanosis, Clubbing or edema, No new Rash or bruise  DISCHARGE CONDITION: Stable  DISPOSITION: Home with home health services  DISCHARGE INSTRUCTIONS:    Activity:  As tolerated with Full fall precautions use walker/cane & assistance as needed  Diet recommendation: Diabetic Diet Heart Healthy diet  Discharge Instructions    Call MD for:  persistant nausea and vomiting    Complete by:  As directed      Diet - low sodium heart healthy    Complete by:  As directed      Diet Carb Modified    Complete by:  As directed      Increase activity slowly    Complete by:  As directed            Follow-up Information    Follow up with [facility].   Why:  Registered Nurse, Aide and Physical Therapy   Contact information:   [address] [place] [address] [phone]       Follow up with Leanor Rubenstein, MD. Go on 08/09/2015.    Specialty:  Family Medicine   Why:  @ 12:15pm   Contact information:   [address] [place] [address] [phone]       Follow up with Sebastian Ache, MD. Schedule an appointment as soon as possible for a visit in 1 week.   Specialty:  Urology   Why:  for post hospital follow up and voiding trial (removal of foley catheter trial)   Contact information:   [address] [place] [address] [phone]       Follow up with [facility].   Why:  rolling walker arranged- to be delivered to room prior to discharge   Contact information:   [address] [place] [address] [phone]       Follow up with [name], MD. Schedule an appointment as soon as possible for a visit in 2 weeks.   Specialty:  Cardiology   Contact information:   [address] [place] [address] [phone]       Total Time spent on discharge equals  45 minutes.  SignedJeoffrey Massed 08/02/2015 3:12 PM

## 2015-08-23 ENCOUNTER — Ambulatory Visit: Payer: Self-pay | Admitting: Cardiology

## 2015-09-29 ENCOUNTER — Ambulatory Visit (INDEPENDENT_AMBULATORY_CARE_PROVIDER_SITE_OTHER): Payer: Medicare Other | Admitting: Cardiology

## 2015-09-29 ENCOUNTER — Encounter: Payer: Self-pay | Admitting: Cardiology

## 2015-09-29 VITALS — BP 168/96 | HR 68 | Ht 64.0 in | Wt 114.0 lb

## 2015-09-29 DIAGNOSIS — I10 Essential (primary) hypertension: Secondary | ICD-10-CM

## 2015-09-29 DIAGNOSIS — I340 Nonrheumatic mitral (valve) insufficiency: Secondary | ICD-10-CM | POA: Diagnosis not present

## 2015-09-29 DIAGNOSIS — E43 Unspecified severe protein-calorie malnutrition: Secondary | ICD-10-CM

## 2015-09-29 DIAGNOSIS — I517 Cardiomegaly: Secondary | ICD-10-CM | POA: Diagnosis not present

## 2015-09-29 DIAGNOSIS — I440 Atrioventricular block, first degree: Secondary | ICD-10-CM | POA: Diagnosis not present

## 2015-09-29 DIAGNOSIS — R55 Syncope and collapse: Secondary | ICD-10-CM

## 2015-09-29 NOTE — Progress Notes (Signed)
[address] [place], [place]  [address] Phone: [phone] Fax:  [phone]  Date:  09/29/2015   ID:  Dana, Blair April 28, 1941, MRN [id number]  PCP:  Leanor Rubenstein, MD   History of Present Illness: Dana Blair is a 74 y.o. female with difficult to control hypertension, diabetes, prior CVA, hyperlipidemia here for followup hypertension and hospital follow-up.   She had an episode of syncope , echo was normal, acute kidney injury resolved likely from dehydration and urinary tract infection resolved as well. She had a biopsy secondary to rectal thickening that was normal, Dr. Dulce Sellar. Reassuring. Malnutrition noted. Ovarian cyst, recommended one-year follow-up per radiology.  Hypertension- She does have a mild first degree AV block on EKG and she is on beta blocker. We will carefully monitor.   Hyperlipidemia-currently very well controlled with LDL of 85. Continuing with Vytorin  ECHO: 07/27/15 - LVEF 65-70%, moderate LVH, normal wall motion, diastolic dysfunction, indeterminate LV filling pressure, normal LA size.   Wt Readings from Last 3 Encounters:  09/29/15 114 lb (51.71 kg)  08/02/15 117 lb 14.4 oz (53.479 kg)  08/13/14 134 lb (60.782 kg)     Past Medical History  Diagnosis Date  . Diabetes     Diabetes mellitus with chronic kidney disease   . Hypertension     [facility]- ECHO 06/29/09 - LVH, normal EF, renal ultrasound-no renal artery stenosis  . Hyperlipidemia   . 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 She had concomitant respiratory failure. On May 22, 2008, CT of the head without contrast demonstrated a 9 x 20 mm acute hematoma within the head of the right caudate with intraventricular extension of hemorrhage. Neurosurgery consult-no ventriculostomy.  . Glaucoma   . Osteopenia   . Chronic kidney disease   . Syncope 07/26/2015    Past Surgical History  Procedure Laterality Date  . Other surgical history  1970   Hysterectomy  . Abdominal hysterectomy    . Flexible sigmoidoscopy N/A 07/30/2015    Procedure: FLEXIBLE SIGMOIDOSCOPY;  Surgeon: Dorena Cookey, MD;  Location: [facility] ENDOSCOPY;  Service: Endoscopy;  Laterality: N/A;  2 fleets enemas at 7:30 AM on July 30    Current Outpatient Prescriptions  Medication Sig Dispense Refill  . alendronate (FOSAMAX) 70 MG tablet Take 70 mg by mouth once a week. Take with a full glass of water on an empty stomach.    Marland Kitchen aspirin 81 MG tablet Take 81 mg by mouth daily.    Marland Kitchen ezetimibe-simvastatin (VYTORIN) 10-20 MG per tablet Take 1 tablet by mouth daily. 30 tablet 1  . nebivolol (BYSTOLIC) 5 MG tablet Take 1 tablet (5 mg total) by mouth daily. 30 tablet 0  . sitaGLIPtin-metformin (JANUMET) 50-1000 MG per tablet Take 1 tablet by mouth 2 (two) times daily with a meal.     No current facility-administered medications for this visit.    Allergies:   No Known Allergies  Social History:  The patient  reports that she has never smoked. She has never used smokeless tobacco. She reports that she does not drink alcohol or use illicit drugs.   Family History  Problem Relation Age of Onset  . Diabetes Mother   . Diabetes Father   . Diabetes Sister     ROS:  Please see the history of present illness.   Denies any new strokelike symptoms, fevers, chills, orthopnea, PND   All other systems reviewed  and negative.   PHYSICAL EXAM: VS:  BP 168/96 mmHg  Pulse 68  Ht  (1.626 m)  Wt 114 lb (51.71 kg)  BMI 19.56 kg/m2  Thin, tremulous, in no acute distress HEENT: normal, [place]/AT, EOMI Neck: no JVD, normal carotid upstroke, no bruit Cardiac:  normal S1, S2; RRR; 2/6 apical systolic murmur Lungs:  clear to auscultation bilaterally, no wheezing, rhonchi or raleskyphosis Abd: soft, nontender, no hepatomegaly, no bruits Ext: no edema, 2+ distal pulses Skin: warm and dry GU: deferred Neuro:  tremulous, AAO x 3  EKG:  08/13/14-sinus rhythm, first degree AV block, PR interval  214 ms, poor R-wave progression    Labs: A1c-6.2, LDL 85  ASSESSMENT AND PLAN:  1.  syncope-telemetry unremarkable in hospital, echo normal EF. Likely dehydration. Continue to encourage nutrition. 2. History of stroke-secondary prevention medications as above. No significant symptoms. 3. Hypertension-previously difficult to control. Currently doing very well on this regimen. 4. Mitral regurgitation-mild. Mild tricuspid regurgitation as well. Should be of no clinical significance. 5. Diabetes-well controlled. Dr. [name]. 6. Hyperlipidemia -- Vytorin 10/20. LDL 85. Continue. Post stroke. 7. 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 Continue to monitor. I'm fine with her continuing with beta blocker. 8.  nutrition malnutrition -encourage protein uptake 9.  six-month followup.  Signed, [name], MD [facility]  09/29/2015 2:50 PM

## 2015-09-29 NOTE — Patient Instructions (Signed)
Medication Instructions:  The current medical regimen is effective;  continue present plan and medications.  Follow-Up: Follow up in 6 months with Dr. Skains.  You will receive a letter in the mail 2 months before you are due.  Please call us when you receive this letter to schedule your follow up appointment.  Thank you for choosing [facility]!!     

## 2015-10-02 ENCOUNTER — Other Ambulatory Visit: Payer: Self-pay | Admitting: Cardiology

## 2015-11-11 ENCOUNTER — Telehealth: Payer: Self-pay | Admitting: Cardiology

## 2015-11-11 ENCOUNTER — Other Ambulatory Visit: Payer: Self-pay

## 2015-11-11 MED ORDER — NEBIVOLOL HCL 5 MG PO TABS: 5.0000 mg | ORAL_TABLET | Freq: Every day | ORAL | Status: DC

## 2015-11-11 NOTE — Telephone Encounter (Signed)
°*  STAT* If patient is at the pharmacy, call can be transferred to refill team.   1. Which medications need to be refilled? (please list name of each medication and dose if known) bistolic  2. Which pharmacy/location (including street and city if local pharmacy) is medication to be sent to?  [facility] on [address]  3. Do they need a 30 day or 90 day supply? 30

## 2016-04-23 ENCOUNTER — Inpatient Hospital Stay (HOSPITAL_COMMUNITY): Payer: Medicare Other

## 2016-04-23 ENCOUNTER — Encounter (HOSPITAL_COMMUNITY): Payer: Self-pay | Admitting: Emergency Medicine

## 2016-04-23 ENCOUNTER — Inpatient Hospital Stay (HOSPITAL_COMMUNITY)
Admission: EM | Admit: 2016-04-23 | Discharge: 2016-05-01 | DRG: 100 | Disposition: A | Discharge status: Skilled Nursing Facility | Payer: Medicare Other | Attending: Internal Medicine | Admitting: Internal Medicine

## 2016-04-23 ENCOUNTER — Emergency Department (HOSPITAL_COMMUNITY): Payer: Medicare Other

## 2016-04-23 DIAGNOSIS — I638 Other cerebral infarction: Secondary | ICD-10-CM | POA: Diagnosis not present

## 2016-04-23 DIAGNOSIS — R739 Hyperglycemia, unspecified: Secondary | ICD-10-CM

## 2016-04-23 DIAGNOSIS — R1310 Dysphagia, unspecified: Secondary | ICD-10-CM | POA: Diagnosis present

## 2016-04-23 DIAGNOSIS — N390 Urinary tract infection, site not specified: Secondary | ICD-10-CM | POA: Diagnosis present

## 2016-04-23 DIAGNOSIS — I4892 Unspecified atrial flutter: Secondary | ICD-10-CM | POA: Diagnosis present

## 2016-04-23 DIAGNOSIS — Z452 Encounter for adjustment and management of vascular access device: Secondary | ICD-10-CM | POA: Insufficient documentation

## 2016-04-23 DIAGNOSIS — K219 Gastro-esophageal reflux disease without esophagitis: Secondary | ICD-10-CM | POA: Diagnosis present

## 2016-04-23 DIAGNOSIS — H409 Unspecified glaucoma: Secondary | ICD-10-CM | POA: Diagnosis present

## 2016-04-23 DIAGNOSIS — E860 Dehydration: Secondary | ICD-10-CM | POA: Diagnosis present

## 2016-04-23 DIAGNOSIS — R0689 Other abnormalities of breathing: Secondary | ICD-10-CM | POA: Diagnosis present

## 2016-04-23 DIAGNOSIS — E1122 Type 2 diabetes mellitus with diabetic chronic kidney disease: Secondary | ICD-10-CM | POA: Diagnosis present

## 2016-04-23 DIAGNOSIS — I4891 Unspecified atrial fibrillation: Secondary | ICD-10-CM | POA: Diagnosis present

## 2016-04-23 DIAGNOSIS — I6789 Other cerebrovascular disease: Secondary | ICD-10-CM | POA: Diagnosis not present

## 2016-04-23 DIAGNOSIS — I639 Cerebral infarction, unspecified: Secondary | ICD-10-CM | POA: Diagnosis present

## 2016-04-23 DIAGNOSIS — I440 Atrioventricular block, first degree: Secondary | ICD-10-CM | POA: Diagnosis present

## 2016-04-23 DIAGNOSIS — J9601 Acute respiratory failure with hypoxia: Secondary | ICD-10-CM | POA: Diagnosis not present

## 2016-04-23 DIAGNOSIS — N189 Chronic kidney disease, unspecified: Secondary | ICD-10-CM | POA: Diagnosis present

## 2016-04-23 DIAGNOSIS — J9600 Acute respiratory failure, unspecified whether with hypoxia or hypercapnia: Secondary | ICD-10-CM | POA: Diagnosis not present

## 2016-04-23 DIAGNOSIS — G9340 Encephalopathy, unspecified: Secondary | ICD-10-CM | POA: Diagnosis not present

## 2016-04-23 DIAGNOSIS — I129 Hypertensive chronic kidney disease with stage 1 through stage 4 chronic kidney disease, or unspecified chronic kidney disease: Secondary | ICD-10-CM | POA: Diagnosis present

## 2016-04-23 DIAGNOSIS — M8580 Other specified disorders of bone density and structure, unspecified site: Secondary | ICD-10-CM | POA: Diagnosis present

## 2016-04-23 DIAGNOSIS — Z833 Family history of diabetes mellitus: Secondary | ICD-10-CM | POA: Diagnosis not present

## 2016-04-23 DIAGNOSIS — E785 Hyperlipidemia, unspecified: Secondary | ICD-10-CM | POA: Diagnosis present

## 2016-04-23 DIAGNOSIS — Z8673 Personal history of transient ischemic attack (TIA), and cerebral infarction without residual deficits: Secondary | ICD-10-CM | POA: Diagnosis not present

## 2016-04-23 DIAGNOSIS — E872 Acidosis: Secondary | ICD-10-CM | POA: Diagnosis present

## 2016-04-23 DIAGNOSIS — Z90710 Acquired absence of both cervix and uterus: Secondary | ICD-10-CM | POA: Diagnosis not present

## 2016-04-23 DIAGNOSIS — G40901 Epilepsy, unspecified, not intractable, with status epilepticus: Principal | ICD-10-CM | POA: Diagnosis present

## 2016-04-23 DIAGNOSIS — R9401 Abnormal electroencephalogram [EEG]: Secondary | ICD-10-CM | POA: Diagnosis not present

## 2016-04-23 DIAGNOSIS — E876 Hypokalemia: Secondary | ICD-10-CM | POA: Diagnosis present

## 2016-04-23 DIAGNOSIS — I672 Cerebral atherosclerosis: Secondary | ICD-10-CM | POA: Diagnosis present

## 2016-04-23 DIAGNOSIS — Z9289 Personal history of other medical treatment: Secondary | ICD-10-CM

## 2016-04-23 DIAGNOSIS — B9620 Unspecified Escherichia coli [E. coli] as the cause of diseases classified elsewhere: Secondary | ICD-10-CM | POA: Diagnosis present

## 2016-04-23 DIAGNOSIS — N179 Acute kidney failure, unspecified: Secondary | ICD-10-CM | POA: Diagnosis present

## 2016-04-23 DIAGNOSIS — I10 Essential (primary) hypertension: Secondary | ICD-10-CM | POA: Diagnosis not present

## 2016-04-23 DIAGNOSIS — Z7983 Long term (current) use of bisphosphonates: Secondary | ICD-10-CM

## 2016-04-23 DIAGNOSIS — E1310 Other specified diabetes mellitus with ketoacidosis without coma: Secondary | ICD-10-CM | POA: Diagnosis present

## 2016-04-23 DIAGNOSIS — I158 Other secondary hypertension: Secondary | ICD-10-CM | POA: Diagnosis not present

## 2016-04-23 DIAGNOSIS — Z7982 Long term (current) use of aspirin: Secondary | ICD-10-CM | POA: Diagnosis not present

## 2016-04-23 DIAGNOSIS — Z7984 Long term (current) use of oral hypoglycemic drugs: Secondary | ICD-10-CM | POA: Diagnosis not present

## 2016-04-23 LAB — CBC
HCT: 46.3 % — ABNORMAL HIGH (ref 36.0–46.0)
HEMATOCRIT: 44.6 % (ref 36.0–46.0)
HEMOGLOBIN: 15 g/dL (ref 12.0–15.0)
HEMOGLOBIN: 14.2 g/dL (ref 12.0–15.0)
MCH: 27.3 pg (ref 26.0–34.0)
MCH: 26.4 pg (ref 26.0–34.0)
MCHC: 32.4 g/dL (ref 30.0–36.0)
MCHC: 31.8 g/dL (ref 30.0–36.0)
MCV: 84.2 fL (ref 78.0–100.0)
MCV: 83.1 fL (ref 78.0–100.0)
Platelets: 260 10*3/uL (ref 150–400)
Platelets: 244 10*3/uL (ref 150–400)
RBC: 5.5 MIL/uL — AB (ref 3.87–5.11)
RBC: 5.37 MIL/uL — AB (ref 3.87–5.11)
RDW: 13.2 % (ref 11.5–15.5)
RDW: 13.4 % (ref 11.5–15.5)
WBC: 10.7 10*3/uL — ABNORMAL HIGH (ref 4.0–10.5)
WBC: 12.3 10*3/uL — ABNORMAL HIGH (ref 4.0–10.5)

## 2016-04-23 LAB — ETHANOL: Alcohol, Ethyl (B): <5 mg/dL (ref <5)

## 2016-04-23 LAB — URINALYSIS, ROUTINE W REFLEX MICROSCOPIC
Bilirubin Urine: NEGATIVE
Glucose, UA: >1000 mg/dL — AB
Ketones, ur: NEGATIVE mg/dL
Leukocytes, UA: NEGATIVE
Nitrite: NEGATIVE
Protein, ur: 100 mg/dL — AB
SPECIFIC GRAVITY, URINE: 1.02 (ref 1.005–1.030)
pH: 7 (ref 5.0–8.0)

## 2016-04-23 LAB — COMPREHENSIVE METABOLIC PANEL
ALBUMIN: 3.9 g/dL (ref 3.5–5.0)
ALK PHOS: 126 U/L (ref 38–126)
ALT: 30 U/L (ref 14–54)
AST: 42 U/L — AB (ref 15–41)
Anion gap: 21 — ABNORMAL HIGH (ref 5–15)
BILIRUBIN TOTAL: 0.7 mg/dL (ref 0.3–1.2)
BUN: 21 mg/dL — AB (ref 6–20)
CALCIUM: 9.4 mg/dL (ref 8.9–10.3)
CO2: 19 mmol/L — ABNORMAL LOW (ref 22–32)
CREATININE: 1.39 mg/dL — AB (ref 0.44–1.00)
Chloride: 95 mmol/L — ABNORMAL LOW (ref 101–111)
GFR calc Af Amer: 42 mL/min — ABNORMAL LOW (ref >60)
GFR calc non Af Amer: 36 mL/min — ABNORMAL LOW (ref >60)
GLUCOSE: 670 mg/dL — AB (ref 65–99)
Potassium: 4.2 mmol/L (ref 3.5–5.1)
Sodium: 135 mmol/L (ref 135–145)
TOTAL PROTEIN: 8.1 g/dL (ref 6.5–8.1)

## 2016-04-23 LAB — I-STAT CHEM 8, ED
BUN: 25 mg/dL — AB (ref 6–20)
CALCIUM ION: 1.08 mmol/L — AB (ref 1.13–1.30)
CHLORIDE: 96 mmol/L — AB (ref 101–111)
Creatinine, Ser: 1 mg/dL (ref 0.44–1.00)
GLUCOSE: 630 mg/dL — AB (ref 65–99)
HCT: 52 % — ABNORMAL HIGH (ref 36.0–46.0)
Hemoglobin: 17.7 g/dL — ABNORMAL HIGH (ref 12.0–15.0)
Potassium: 4.2 mmol/L (ref 3.5–5.1)
Sodium: 136 mmol/L (ref 135–145)
TCO2: 23 mmol/L (ref 0–100)

## 2016-04-23 LAB — LIPID PANEL
CHOLESTEROL: 268 mg/dL — AB (ref 0–200)
HDL: 82 mg/dL (ref >40)
LDL CALC: 170 mg/dL — AB (ref 0–99)
Total CHOL/HDL Ratio: 3.3 RATIO
Triglycerides: 82 mg/dL (ref <150)
VLDL: 16 mg/dL (ref 0–40)

## 2016-04-23 LAB — DIFFERENTIAL
BASOS ABS: 0 10*3/uL (ref 0.0–0.1)
Basophils Relative: 0 %
Eosinophils Absolute: 0 10*3/uL (ref 0.0–0.7)
Eosinophils Relative: 0 %
LYMPHS ABS: 2.3 10*3/uL (ref 0.7–4.0)
LYMPHS PCT: 21 %
Monocytes Absolute: 0.5 10*3/uL (ref 0.1–1.0)
Monocytes Relative: 5 %
NEUTROS PCT: 74 %
Neutro Abs: 7.9 10*3/uL — ABNORMAL HIGH (ref 1.7–7.7)

## 2016-04-23 LAB — I-STAT ARTERIAL BLOOD GAS, ED
ACID-BASE EXCESS: 3 mmol/L — AB (ref 0.0–2.0)
Bicarbonate: 28.8 mEq/L — ABNORMAL HIGH (ref 20.0–24.0)
O2 SAT: 100 %
PH ART: 7.407 (ref 7.350–7.450)
PO2 ART: 409 mmHg — AB (ref 80.0–100.0)
TCO2: 30 mmol/L (ref 0–100)
pCO2 arterial: 45.7 mmHg — ABNORMAL HIGH (ref 35.0–45.0)

## 2016-04-23 LAB — I-STAT TROPONIN, ED: Troponin i, poc: 0 ng/mL (ref 0.00–0.08)

## 2016-04-23 LAB — LACTIC ACID, PLASMA
LACTIC ACID, VENOUS: 1.8 mmol/L (ref 0.5–2.0)
LACTIC ACID, VENOUS: 1 mmol/L (ref 0.5–2.0)
Lactic Acid, Venous: 4.6 mmol/L (ref 0.5–2.0)

## 2016-04-23 LAB — BASIC METABOLIC PANEL
Anion gap: 13 (ref 5–15)
BUN: 18 mg/dL (ref 6–20)
CO2: 26 mmol/L (ref 22–32)
Calcium: 8.9 mg/dL (ref 8.9–10.3)
Chloride: 106 mmol/L (ref 101–111)
Creatinine, Ser: 1.23 mg/dL — ABNORMAL HIGH (ref 0.44–1.00)
GFR calc Af Amer: 49 mL/min — ABNORMAL LOW (ref >60)
GFR, EST NON AFRICAN AMERICAN: 42 mL/min — AB (ref >60)
GLUCOSE: 276 mg/dL — AB (ref 65–99)
POTASSIUM: 4.2 mmol/L (ref 3.5–5.1)
Sodium: 145 mmol/L (ref 135–145)

## 2016-04-23 LAB — GLUCOSE, CAPILLARY
GLUCOSE-CAPILLARY: 484 mg/dL — AB (ref 65–99)
GLUCOSE-CAPILLARY: 309 mg/dL — AB (ref 65–99)
GLUCOSE-CAPILLARY: 75 mg/dL (ref 65–99)
GLUCOSE-CAPILLARY: 84 mg/dL (ref 65–99)
GLUCOSE-CAPILLARY: 148 mg/dL — AB (ref 65–99)
GLUCOSE-CAPILLARY: 180 mg/dL — AB (ref 65–99)
Glucose-Capillary: 522 mg/dL — ABNORMAL HIGH (ref 65–99)
Glucose-Capillary: 378 mg/dL — ABNORMAL HIGH (ref 65–99)
Glucose-Capillary: 402 mg/dL — ABNORMAL HIGH (ref 65–99)
Glucose-Capillary: 287 mg/dL — ABNORMAL HIGH (ref 65–99)
Glucose-Capillary: 138 mg/dL — ABNORMAL HIGH (ref 65–99)
Glucose-Capillary: 153 mg/dL — ABNORMAL HIGH (ref 65–99)
Glucose-Capillary: 178 mg/dL — ABNORMAL HIGH (ref 65–99)

## 2016-04-23 LAB — CK: CK TOTAL: 578 U/L — AB (ref 38–234)

## 2016-04-23 LAB — URINE MICROSCOPIC-ADD ON

## 2016-04-23 LAB — PROTIME-INR
INR: 0.87 (ref 0.00–1.49)
Prothrombin Time: 12.1 seconds (ref 11.6–15.2)

## 2016-04-23 LAB — PHOSPHORUS: PHOSPHORUS: 2 mg/dL — AB (ref 2.5–4.6)

## 2016-04-23 LAB — MAGNESIUM: Magnesium: 1.9 mg/dL (ref 1.7–2.4)

## 2016-04-23 LAB — MRSA PCR SCREENING: MRSA BY PCR: NEGATIVE

## 2016-04-23 LAB — TRIGLYCERIDES: TRIGLYCERIDES: 92 mg/dL (ref <150)

## 2016-04-23 LAB — APTT: APTT: 26 s (ref 24–37)

## 2016-04-23 MED ORDER — SODIUM CHLORIDE 0.9 % IV BOLUS (SEPSIS)
1000.0000 mL | Freq: Once | INTRAVENOUS | Status: AC
  Administered 2016-04-23: 1000 mL via INTRAVENOUS

## 2016-04-23 MED ORDER — SODIUM CHLORIDE 0.9 % IV SOLN: 250.0000 mL | INTRAVENOUS | Status: DC | PRN

## 2016-04-23 MED ORDER — PROPOFOL 1000 MG/100ML IV EMUL
5.0000 ug/kg/min | INTRAVENOUS | Status: DC
  Administered 2016-04-23: 5 ug/kg/min via INTRAVENOUS
  Administered 2016-04-23: 35 ug/kg/min via INTRAVENOUS
  Filled 2016-04-23: qty 100

## 2016-04-23 MED ORDER — ENOXAPARIN SODIUM 40 MG/0.4ML ~~LOC~~ SOLN
40.0000 mg | SUBCUTANEOUS | Status: DC
  Administered 2016-04-23 – 2016-04-24 (×2): 40 mg via SUBCUTANEOUS
  Filled 2016-04-23 (×2): qty 0.4

## 2016-04-23 MED ORDER — IOPAMIDOL (ISOVUE-370) INJECTION 76%
INTRAVENOUS | Status: AC
Start: 2016-04-23 — End: 2016-04-23
  Administered 2016-04-23: 80 mL
  Filled 2016-04-23: qty 100

## 2016-04-23 MED ORDER — SODIUM CHLORIDE 0.9 % IV SOLN
INTRAVENOUS | Status: DC
  Administered 2016-04-23: 4.6 [IU]/h via INTRAVENOUS
  Administered 2016-04-24: 3.8 [IU]/h via INTRAVENOUS
  Filled 2016-04-23 (×2): qty 2.5

## 2016-04-23 MED ORDER — SODIUM CHLORIDE 0.9 % IV SOLN
1000.0000 mg | Freq: Two times a day (BID) | INTRAVENOUS | Status: DC
  Administered 2016-04-23 – 2016-04-27 (×9): 1000 mg via INTRAVENOUS
  Filled 2016-04-23 (×10): qty 10

## 2016-04-23 MED ORDER — FAMOTIDINE 40 MG/5ML PO SUSR
20.0000 mg | Freq: Two times a day (BID) | ORAL | Status: DC
  Administered 2016-04-23 – 2016-05-01 (×15): 20 mg
  Filled 2016-04-23 (×17): qty 2.5

## 2016-04-23 MED ORDER — ADULT MULTIVITAMIN LIQUID CH
15.0000 mL | Freq: Every day | ORAL | Status: DC
Start: 2016-04-23 — End: 2016-04-27
  Administered 2016-04-23 – 2016-04-26 (×4): 15 mL
  Filled 2016-04-23 (×4): qty 15

## 2016-04-23 MED ORDER — PROPOFOL 1000 MG/100ML IV EMUL
INTRAVENOUS | Status: AC
  Filled 2016-04-23: qty 100

## 2016-04-23 MED ORDER — LORAZEPAM 2 MG/ML IJ SOLN
2.0000 mg | Freq: Once | INTRAMUSCULAR | Status: AC
  Administered 2016-04-23: 2 mg via INTRAVENOUS

## 2016-04-23 MED ORDER — ASPIRIN EC 325 MG PO TBEC: 325.0000 mg | DELAYED_RELEASE_TABLET | Freq: Every day | ORAL | Status: DC

## 2016-04-23 MED ORDER — STROKE: EARLY STAGES OF RECOVERY BOOK
Freq: Once | Status: AC
  Administered 2016-04-23: 1
  Filled 2016-04-23: qty 1

## 2016-04-23 MED ORDER — SODIUM CHLORIDE 0.9 % IV SOLN
150.0000 mg | Freq: Two times a day (BID) | INTRAVENOUS | Status: DC
  Administered 2016-04-24 (×2): 150 mg via INTRAVENOUS
  Filled 2016-04-23 (×7): qty 15

## 2016-04-23 MED ORDER — CHLORHEXIDINE GLUCONATE 0.12% ORAL RINSE (MEDLINE KIT)
15.0000 mL | Freq: Two times a day (BID) | OROMUCOSAL | Status: DC
  Administered 2016-04-23 – 2016-05-01 (×14): 15 mL via OROMUCOSAL

## 2016-04-23 MED ORDER — VITAL HIGH PROTEIN PO LIQD: 1000.0000 mL | ORAL | Status: DC

## 2016-04-23 MED ORDER — ANTISEPTIC ORAL RINSE SOLUTION (CORINZ)
7.0000 mL | Freq: Four times a day (QID) | OROMUCOSAL | Status: DC
  Administered 2016-04-23 – 2016-04-30 (×29): 7 mL via OROMUCOSAL

## 2016-04-23 MED ORDER — VITAL AF 1.2 CAL PO LIQD
1000.0000 mL | ORAL | Status: DC
  Administered 2016-04-23 – 2016-04-25 (×3): 1000 mL

## 2016-04-23 MED ORDER — ASPIRIN 300 MG RE SUPP
300.0000 mg | Freq: Every day | RECTAL | Status: DC
Start: 2016-04-23 — End: 2016-04-26
  Administered 2016-04-23 – 2016-04-25 (×3): 300 mg via RECTAL
  Filled 2016-04-23 (×5): qty 1

## 2016-04-23 MED ORDER — SODIUM CHLORIDE 0.9 % IV SOLN
INTRAVENOUS | Status: DC
  Administered 2016-04-23: 09:00:00 via INTRAVENOUS

## 2016-04-23 MED ORDER — SODIUM CHLORIDE 0.9 % IV SOLN
INTRAVENOUS | Status: DC
  Administered 2016-04-23 – 2016-04-24 (×2): via INTRAVENOUS

## 2016-04-23 MED ORDER — LORAZEPAM 2 MG/ML IJ SOLN
INTRAMUSCULAR | Status: AC
  Administered 2016-04-23: 1 mg via INTRAVENOUS
  Filled 2016-04-23: qty 1

## 2016-04-23 MED ORDER — PRO-STAT SUGAR FREE PO LIQD
30.0000 mL | Freq: Every day | ORAL | Status: DC
  Administered 2016-04-23 – 2016-04-26 (×4): 30 mL
  Filled 2016-04-23 (×4): qty 30

## 2016-04-23 MED ORDER — LEVETIRACETAM 500 MG/5ML IV SOLN
2000.0000 mg | Freq: Once | INTRAVENOUS | Status: AC
  Administered 2016-04-23: 2000 mg via INTRAVENOUS
  Filled 2016-04-23: qty 20

## 2016-04-23 MED ORDER — SODIUM CHLORIDE 0.9 % IV SOLN: 100.0000 mg | Freq: Two times a day (BID) | INTRAVENOUS | Status: DC

## 2016-04-23 MED ORDER — LORAZEPAM 2 MG/ML IJ SOLN
1.0000 mg | Freq: Once | INTRAMUSCULAR | Status: AC
  Administered 2016-04-23: 1 mg via INTRAVENOUS

## 2016-04-23 MED ORDER — LABETALOL HCL 5 MG/ML IV SOLN
10.0000 mg | INTRAVENOUS | Status: DC | PRN
  Administered 2016-04-24 – 2016-04-25 (×4): 10 mg via INTRAVENOUS
  Filled 2016-04-23 (×5): qty 4

## 2016-04-23 MED ORDER — LACOSAMIDE 200 MG/20ML IV SOLN
200.0000 mg | Freq: Once | INTRAVENOUS | Status: AC
  Administered 2016-04-23: 200 mg via INTRAVENOUS
  Filled 2016-04-23: qty 20

## 2016-04-23 MED ORDER — ADULT MULTIVITAMIN LIQUID CH
5.0000 mL | Freq: Every day | ORAL | Status: DC
  Filled 2016-04-23 (×2): qty 15

## 2016-04-23 MED ORDER — LABETALOL HCL 100 MG PO TABS
100.0000 mg | ORAL_TABLET | Freq: Three times a day (TID) | ORAL | Status: DC
  Administered 2016-04-23: 100 mg via ORAL
  Filled 2016-04-23: qty 1

## 2016-04-23 MED ORDER — CHLORHEXIDINE GLUCONATE 0.12% ORAL RINSE (MEDLINE KIT)
15.0000 mL | Freq: Two times a day (BID) | OROMUCOSAL | Status: DC
  Administered 2016-04-23: 15 mL via OROMUCOSAL

## 2016-04-23 MED ORDER — NOREPINEPHRINE BITARTRATE 1 MG/ML IV SOLN
0.0000 ug/min | INTRAVENOUS | Status: DC
  Administered 2016-04-23: 3 ug/min via INTRAVENOUS
  Filled 2016-04-23: qty 4

## 2016-04-23 MED ORDER — LORAZEPAM 2 MG/ML IJ SOLN
INTRAMUSCULAR | Status: AC
  Administered 2016-04-23: 2 mg via INTRAVENOUS
  Filled 2016-04-23: qty 1

## 2016-04-23 NOTE — Progress Notes (Signed)
CRITICAL VALUE ALERT  Critical value received: lactic acid 4.6  Date of notification:  04/23/2016  Time of notification:  1000  Critical value read back:  yes  Nurse who received alert:  Marlane HatcherPatty Malary [place]  MD notified (1st page):  [email or web address]  Time of first page:  1000  MD notified (2nd page):  Time of second page:  Responding MD:  yacoub  Time MD responded:  1000

## 2016-04-23 NOTE — ED Notes (Signed)
OG place

## 2016-04-23 NOTE — ED Notes (Signed)
Patient and RN still in CT

## 2016-04-23 NOTE — Code Documentation (Addendum)
Ms. Marca AnconaGilmore is a 74yo bf [facility] after being unresponsive and seizing by her son.  LKW 2200.  Per EMS the pt was seizing when they arrived and she was given versed 2.5mg  IV.  Her O2 sats initially were 70% on RA.  She was placed on a NRB and [facility] she was cont. To seize and ativan given without success.  Seizure presented with Rt gaze, Rt arm flexion and shaking, facial twitching while Lt side flaccid. Pt was intubated without drugs or difficulty and started on Keppra, Diprivan, and later Vimpat.  She was taken to CT scan.

## 2016-04-23 NOTE — ED Notes (Addendum)
Tube pulled back to  24 @ lip by RT

## 2016-04-23 NOTE — ED Notes (Signed)
Patient has stopped "shaking movements".

## 2016-04-23 NOTE — ED Notes (Signed)
Intubation in process. Breathe sounds bilateral. Color change. 27@ lip 7.5 ET tube

## 2016-04-23 NOTE — Plan of Care (Signed)
Problem: Physical Regulation: Goal: Ability to maintain clinical measurements within normal limits will improve Outcome: Progressing BP labile earlier in the day but stable now  Problem: Fluid Volume: Goal: Ability to maintain a balanced intake and output will improve Outcome: Not Progressing Pt is not making urine well   Problem: Nutrition: Goal: Adequate nutrition will be maintained Outcome: Progressing Pt tolerating tube feeds currently  Problem: Health Behavior/Discharge Planning: Goal: Ability to manage health-related needs will improve Outcome: Not Progressing Pt is unable to participate in care currently  Problem: Nutrition: Goal: Risk of aspiration will decrease Outcome: Not Progressing percautions in place

## 2016-04-23 NOTE — Progress Notes (Signed)
eLink Physician-Brief Progress Note Patient Name: Dana GlossBarbara J Blair DOB: 1941/02/28 MRN: [id number]   Date of Service  04/23/2016  HPI/Events of Note  Bedside nurse reports patient adequately sedated. Remains hypertensive. Patient currently on bi-systolic at home.  eICU Interventions  Labetalol IV when necessary     Intervention Category Intermediate Interventions: Hypertension - evaluation and management  Lawanda CousinsJennings Taitum Alms 04/23/2016, 6:00 AM

## 2016-04-23 NOTE — ED Notes (Signed)
Patient and RN leaving MRI transported to floor.

## 2016-04-23 NOTE — ED Provider Notes (Signed)
CSN: [id number]     Arrival date & time 04/23/16  0143 History  By signing my name below, I, Dana Blair, attest that this documentation has been prepared under the direction and in the presence of Dana Booze, MD . Electronically Signed: Freida Blair, Scribe. 04/23/2016. 2:27 AM.    No chief complaint on file.   LEVEL 5 CAVEAT DUE TO ACUITY OF MEDICAL CONDITION    The history is provided by the EMS personnel. No language interpreter was used.   HPI Comments:  Dana Blair is a 75 y.o. female brought in by ambulance who presents to the Emergency Department unresponsive and actively seizing. Pt was last known well ~ 2200 last night. She was found down by her son. Pt received 5 rounds of versed en route. She has a h/o DM, HTN and CVA. History limited due to acuity of condition.   Past Medical History  Diagnosis Date  . Diabetes     Diabetes mellitus with chronic kidney disease   . Hypertension     [facility]- ECHO 06/29/09 - LVH, normal EF, renal ultrasound-no renal artery stenosis  . Hyperlipidemia   . 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 She had concomitant respiratory failure. On May 22, 2008, CT of the head without contrast demonstrated a 9 x 20 mm acute hematoma within the head of the right caudate with intraventricular extension of hemorrhage. Neurosurgery consult-no ventriculostomy.  . Glaucoma   . Osteopenia   . Chronic kidney disease   . Syncope 07/26/2015   Past Surgical History  Procedure Laterality Date  . Other surgical history  1970    Hysterectomy  . Abdominal hysterectomy    . Flexible sigmoidoscopy N/A 07/30/2015    Procedure: FLEXIBLE SIGMOIDOSCOPY;  Surgeon: Dorena Cookey, MD;  Location: [facility] ENDOSCOPY;  Service: Endoscopy;  Laterality: N/A;  2 fleets enemas at 7:30 AM on July 30   Family History  Problem Relation Age of Onset  . Diabetes Mother   . Diabetes Father   . Diabetes Sister    Social History  Substance Use Topics  . Smoking status:  Never Smoker   . Smokeless tobacco: Never Used  . Alcohol Use: No   OB History    No data available     Review of Systems  Unable to perform ROS: Acuity of condition   Allergies  Review of patient's allergies indicates no known allergies.  Home Medications   Prior to Admission medications   Medication Sig Start Date End Date Taking? Authorizing Provider  alendronate (FOSAMAX) 70 MG tablet Take 70 mg by mouth once a week. Take with a full glass of water on an empty stomach.    Historical Provider, MD  aspirin 81 MG tablet Take 81 mg by mouth daily.    Historical Provider, MD  nebivolol (BYSTOLIC) 5 MG tablet Take 1 tablet (5 mg total) by mouth daily. 11/11/15   [name], MD  sitaGLIPtin-metformin (JANUMET) 50-1000 MG per tablet Take 1 tablet by mouth 2 (two) times daily with a meal.    Historical Provider, MD  VYTORIN 10-20 MG tablet TAKE ONE TABLET BY MOUTH ONCE DAILY 10/03/15   [name], MD   BP 160/108 mmHg  Pulse 148  Resp 35  Ht  (1.626 m)  Wt 113 lb 15.7 oz (51.7 kg)  BMI 19.55 kg/m2  SpO2 100% Physical Exam  Constitutional:  Unresponsive, having ongoing sz activity involving right side.  HENT:  Head: Normocephalic and atraumatic.  Eyes:  Pupils pinpoint; eyes deviated to the right   Cardiovascular: Normal heart sounds.  Tachycardia present.   Pulmonary/Chest: Breath sounds normal.  Abdominal: She exhibits no distension.  Neurological:  Unresponsive  Absent gag reflex  Skin: Skin is warm and dry.  Nursing note and vitals reviewed.   ED Course  Procedures   DIAGNOSTIC STUDIES:  Oxygen Saturation is 100% on ETT, normal by my interpretation.     Labs Review Results for orders placed or performed during the hospital encounter of 04/23/16  MRSA PCR Screening  Result Value Ref Range   MRSA by PCR NEGATIVE NEGATIVE  Protime-INR  Result Value Ref Range   Prothrombin Time 12.1 11.6 - 15.2 seconds   INR 0.87 0.00 - 1.49  APTT  Result Value  Ref Range   aPTT 26 24 - 37 seconds  CBC  Result Value Ref Range   WBC 10.7 (H) 4.0 - 10.5 K/uL   RBC 5.50 (H) 3.87 - 5.11 MIL/uL   Hemoglobin 15.0 12.0 - 15.0 g/dL   HCT 16.1 (H) 09.6 - 04.5 %   MCV 84.2 78.0 - 100.0 fL   MCH 27.3 26.0 - 34.0 pg   MCHC 32.4 30.0 - 36.0 g/dL   RDW 40.9 81.1 - 91.4 %   Platelets 260 150 - 400 K/uL  Differential  Result Value Ref Range   Neutrophils Relative % 74 %   Neutro Abs 7.9 (H) 1.7 - 7.7 K/uL   Lymphocytes Relative 21 %   Lymphs Abs 2.3 0.7 - 4.0 K/uL   Monocytes Relative 5 %   Monocytes Absolute 0.5 0.1 - 1.0 K/uL   Eosinophils Relative 0 %   Eosinophils Absolute 0.0 0.0 - 0.7 K/uL   Basophils Relative 0 %   Basophils Absolute 0.0 0.0 - 0.1 K/uL  Comprehensive metabolic panel  Result Value Ref Range   Sodium 135 135 - 145 mmol/L   Potassium 4.2 3.5 - 5.1 mmol/L   Chloride 95 (L) 101 - 111 mmol/L   CO2 19 (L) 22 - 32 mmol/L   Glucose, Bld 670 (HH) 65 - 99 mg/dL   BUN 21 (H) 6 - 20 mg/dL   Creatinine, Ser 7.82 (H) 0.44 - 1.00 mg/dL   Calcium 9.4 8.9 - 95.6 mg/dL   Total Protein 8.1 6.5 - 8.1 g/dL   Albumin 3.9 3.5 - 5.0 g/dL   AST 42 (H) 15 - 41 U/L   ALT 30 14 - 54 U/L   Alkaline Phosphatase 126 38 - 126 U/L   Total Bilirubin 0.7 0.3 - 1.2 mg/dL   GFR calc non Af Amer 36 (L) >60 mL/min   GFR calc Af Amer 42 (L) >60 mL/min   Anion gap 21 (H) 5 - 15  Ethanol  Result Value Ref Range   Alcohol, Ethyl (B) <5 <5 mg/dL  Urinalysis, Routine w reflex microscopic (not at [facility])  Result Value Ref Range   Color, Urine YELLOW YELLOW   APPearance CLEAR CLEAR   Specific Gravity, Urine 1.020 1.005 - 1.030   pH 7.0 5.0 - 8.0   Glucose, UA >1000 (A) NEGATIVE mg/dL   Hgb urine dipstick MODERATE (A) NEGATIVE   Bilirubin Urine NEGATIVE NEGATIVE   Ketones, ur NEGATIVE NEGATIVE mg/dL   Protein, ur 213 (A) NEGATIVE mg/dL   Nitrite NEGATIVE NEGATIVE   Leukocytes, UA NEGATIVE NEGATIVE  Triglycerides  Result Value Ref Range   Triglycerides 92  <150 mg/dL  Urine  microscopic-add on  Result Value Ref Range   Squamous Epithelial / LPF 0-5 (A) NONE SEEN   WBC, UA 0-5 0 - 5 WBC/hpf   RBC / HPF 6-30 0 - 5 RBC/hpf   Bacteria, UA FEW (A) NONE SEEN  I-stat troponin, ED (not at [facility], [facility])  Result Value Ref Range   Troponin i, poc 0.00 0.00 - 0.08 ng/mL   Comment 3          I-Stat Chem 8, ED  (not at [facility], [facility])  Result Value Ref Range   Sodium 136 135 - 145 mmol/L   Potassium 4.2 3.5 - 5.1 mmol/L   Chloride 96 (L) 101 - 111 mmol/L   BUN 25 (H) 6 - 20 mg/dL   Creatinine, Ser 0.341.00 0.44 - 1.00 mg/dL   Glucose, Bld 742630 (HH) 65 - 99 mg/dL   Calcium, Ion 5.951.08 (L) 1.13 - 1.30 mmol/L   TCO2 23 0 - 100 mmol/L   Hemoglobin 17.7 (H) 12.0 - 15.0 g/dL   HCT 63.852.0 (H) 75.636.0 - 43.346.0 %   Comment NOTIFIED PHYSICIAN   I-Stat arterial blood gas, ED  Result Value Ref Range   pH, Arterial 7.407 7.350 - 7.450   pCO2 arterial 45.7 (H) 35.0 - 45.0 mmHg   pO2, Arterial 409.0 (H) 80.0 - 100.0 mmHg   Bicarbonate 28.8 (H) 20.0 - 24.0 mEq/L   TCO2 30 0 - 100 mmol/L   O2 Saturation 100.0 %   Acid-Base Excess 3.0 (H) 0.0 - 2.0 mmol/L   Patient temperature 98.6 F    Collection site RADIAL, ALLEN'S TEST ACCEPTABLE    Drawn by Operator    Sample type ARTERIAL    Imaging Review Dg Chest Portable 1 View  04/23/2016  CLINICAL DATA:  Endotracheal tube and orogastric tube placement. Initial encounter. EXAM: PORTABLE CHEST 1 VIEW COMPARISON:  Chest radiograph performed 07/26/2015 FINDINGS: The patient's endotracheal tube is seen ending 1 cm above the carina. This could be retracted 2 cm. The patient's enteric tube is seen extending below the diaphragm. The lungs appear relatively clear. No pleural effusion or pneumothorax is seen. The cardiomediastinal silhouette is normal in size. No acute osseous abnormalities are identified. IMPRESSION: 1. Endotracheal tube seen ending 1 cm above the carina. This could be retracted 2 cm. 2. Enteric tube noted extending below the  diaphragm. 3. Lungs clear bilaterally. These results were called by telephone at the time of interpretation on 04/23/2016 at 2:17 am to Dr. Dione BoozeAVID Mansel Strother, who verbally acknowledged these results. Electronically Signed   By: Roanna RaiderJeffery  Chang M.D.   On: 04/23/2016 02:17   I have personally reviewed and evaluated these images and lab results as part of my medical decision-making.   EKG Interpretation   Date/Time:  Monday April 23 2016 01:49:25 EDT Ventricular Rate:  162 PR Interval:  257 QRS Duration: 77 QT Interval:  306 QTC Calculation: 502 R Axis:   113 Text Interpretation:  Supraventricular tachycardia Right axis deviation  Consider left ventricular hypertrophy ST depression, probably rate related  When compared with ECG of 07/26/2015, HEART RATE has increased ST  depression is more pronounced - probably rate related Confirmed by [facility], LPGLICK   MD, [place] Stroope ([id number]) on 04/23/2016 2:05:41 AM      EMERGENT INTUBATION PROCEDURE NOTE INTUBATION Performed by: ACZYS,AYTKZGLICK,Ynez Eugenio  Required items: required blood products, implants, devices, and special equipment available Patient identity confirmed: provided demographic data and hospital-assigned identification number Time out: Immediately prior to procedure a "time out" was called to  verify the correct patient, procedure, equipment, support staff and site/side marked as required.  Indications: Airway protection, absent gag reflex   Intubation method: Glidescope Laryngoscopy   Preoxygenation: BVM  Sedatives: None  Paralytic: None   Tube Size: 7.5 cuffed  Post-procedure assessment: chest rise and ETCO2 monitor Breath sounds: equal and absent over the epigastrium Tube secured with: ETT holder Chest x-ray interpreted by radiologist and me.  Chest x-ray findings: endotracheal tube was 1 cm proximal to the carina and was pulled back 2 cm.   Patient tolerated the procedure well with no immediate complications.     CRITICAL CARE Performed by: Dana Booze, MD Total critical care time: 60 minutes Critical care time was exclusive of separately billable procedures and treating other patients. Critical care was necessary to treat or prevent imminent or life-threatening deterioration. Critical care was time spent personally by me on the following activities: development of treatment plan with patient and/or surrogate as well as nursing, discussions with consultants, evaluation of patient's response to treatment, examination of patient, obtaining history from patient or surrogate, ordering and performing treatments and interventions, ordering and review of laboratory studies, ordering and review of radiographic studies, pulse oximetry and re-evaluation of patient's condition.   MDM   Final diagnoses:  Stroke [facility])  Status epilepticus  Hyperglycemia  Renal insufficiency     Patient presented with status epilepticus so with a focal seizure involving the right side. She had received 5 mg of diazepam en route. She was noted to have no gag reflex, so she was intubated to protect her airway. Neurology was consulted and saw the patient with me. Once she was intubated, she was started on a diprovan drip. Laboratory workup did show elevated blood sugar and elevated anion gap and was concerned for ketoacidosis. However, blood gas showed normal pH. It was felt that the low CO2 probably represented lactic acidosis from her seizure. She was started on glucose stabilizer based on blood sugar of over 600. Hemoglobin of 17.7 was felt to probably indicate dehydration which also probably contributed to her blood sugar. She was given a liter of saline. CT scans obtained showed no evidence of acute intracranial pathology, but MRI scan did appear to show an acute stroke. Old records are reviewed and she had no relevant past visits. Prior head CT was unremarkable. She was admitted on the critical care service.  I personally performed the services described in this  documentation, which was scribed in my presence. The recorded information has been reviewed and is accurate.      Dana Booze, MD 04/23/16 [phone]

## 2016-04-23 NOTE — Progress Notes (Signed)
PULMONARY / CRITICAL CARE MEDICINE   Name: Dana Blair MRN: [id number] DOB: 07-Sep-1941    ADMISSION DATE:  04/23/2016 CONSULTATION DATE:  04/23/16  REFERRING MD:  EDP  CHIEF COMPLAINT:  seizures  HISTORY OF PRESENT ILLNESS:   Dana Blair is a 67F with history of prior ischemic and hemorrhagic strokes who presents to the ED via EMS after her family found her seizing with R gaze deviation. Last known normal was around 10pm on 4/23. She continued to seize with mostly right-sided involvement, but occasional left-sided symptoms as well. She received 5mg  versed en route. She was desaturating and required intubation. CT head was negative for blood. By the time imaging was obtained she was outside the window for TPA. CTA was ordered. On arrival to the ED she was also in atrial fibrillation, which reportedly is new for her. She was placed on propofol for seizure suppression, loaded with keppra, given additional benzos, and vimpat was also added.  Currently, she is intubated and sedated. No family is available for additional history. Her BP has been labile and a balance with sedation has been difficult to achieve. She appears to be intermittently seizing, now with more pronounced symptoms on the left.   SUBJECTIVE:  Hypertensive overnight.  Off sedation.  VITAL SIGNS: BP 175/93 mmHg  Pulse 97  Temp(Src) 98.7 F (37.1 C) (Axillary)  Resp 15  Ht 5\' 4"  (1.626 m)  Wt 51.3 kg (113 lb 1.5 oz)  BMI 19.40 kg/m2  SpO2 100%  HEMODYNAMICS:    VENTILATOR SETTINGS: Vent Mode:  [-] PRVC FiO2 (%):  [40 %-100 %] 40 % Set Rate:  [14 bmp] 14 bmp Vt Set:  [440 mL] 440 mL PEEP:  [5 cmH20] 5 cmH20 Plateau Pressure:  [14 cmH20-15 cmH20] 14 cmH20  INTAKE / OUTPUT: I/O last 3 completed shifts: In: 49.4 [I.V.:19.4; NG/GT:30] Out: 1550 [Urine:1400; Emesis/NG output:150]  PHYSICAL EXAMINATION:  General Well developed, no apparent distress  HEENT No gross abnormalities. ETT/OGT in place, [place]/AT,  PERRL, EOM spontaneous.  Pulmonary Clear to auscultation bilaterally with no wheezes, rales or ronchi. No respiratory effort on PS.   Cardiovascular Tachycardic, regular rhythm. S1, s2. No m/r/g. Distal pulses palpable.  Abdomen Soft, non-tender, non-distended, positive bowel sounds, no palpable organomegaly or masses. Normoresonant to percussion.  Musculoskeletal No bony abnormalities  Lymphatics No cervical, supraclavicular or axillary adenopathy.   Neurologic Pupils reactive. Withdraws to noxious stimuli on the left, no response on the right. No commands.  No seizure this AM.  Skin/Integuement Diffuse actinic keratoses, some areas on the lower abdomen that are more scaly appearing. No cyanosis, no clubbing.     LABS:  BMET  Recent Labs Lab 04/23/16 0150 04/23/16 0157  NA 135 136  K 4.2 4.2  CL 95* 96*  CO2 19*  --   BUN 21* 25*  CREATININE 1.39* 1.00  GLUCOSE 670* 630*    Electrolytes  Recent Labs Lab 04/23/16 0150  CALCIUM 9.4    CBC  Recent Labs Lab 04/23/16 0150 04/23/16 0157  WBC 10.7*  --   HGB 15.0 17.7*  HCT 46.3* 52.0*  PLT 260  --     Coag's  Recent Labs Lab 04/23/16 0150  APTT 26  INR 0.87    Sepsis Markers No results for input(s): LATICACIDVEN, PROCALCITON, O2SATVEN in the last 168 hours.  ABG  Recent Labs Lab 04/23/16 0300  PHART 7.407  PCO2ART 45.7*  PO2ART 409.0*    Liver Enzymes  Recent Labs Lab 04/23/16 0150  AST 42*  ALT 30  ALKPHOS 126  BILITOT 0.7  ALBUMIN 3.9    Cardiac Enzymes No results for input(s): TROPONINI, PROBNP in the last 168 hours.  Glucose No results for input(s): GLUCAP in the last 168 hours.  Imaging Ct Angio Head W/cm &/or Wo Cm  04/23/2016  CLINICAL DATA:  Initial evaluation for acute seizure, right gaze deviation. EXAM: CT ANGIOGRAPHY HEAD AND NECK TECHNIQUE: Multidetector CT imaging of the head and neck was performed using the standard protocol during bolus administration of intravenous  contrast. Multiplanar CT image reconstructions and MIPs were obtained to evaluate the vascular anatomy. Carotid stenosis measurements (when applicable) are obtained utilizing NASCET criteria, using the distal internal carotid diameter as the denominator. CONTRAST:  80 cc of Isovue 370. COMPARISON:  Prior noncontrast head CT from earlier same day. FINDINGS: Study is limited by a technical error, with the mid and superior aspect of the the brain not visualized on arterial phase imaging. CTA NECK Aortic arch: Visualized aortic arch of normal caliber with normal 3 vessel morphology. Scattered calcified plaque within the arch itself and about the origin of the great vessels without flow-limiting stenosis. Scattered calcified plaque within the proximal subclavian arteries with mild narrowing. Subclavian arteries otherwise patent. Right carotid system: Right common carotid artery patent from its origin to the bifurcation. Mild plaque about the proximal right ICA with associated narrowing of approximately 30% by NASCET criteria. Probable small penetrating ulcer present at this level as well. Right ICA patent distally without stenosis, occlusion, or dissection. Multifocal atheromatous irregularity within the right ICA without high-grade stenosis. Right external carotid artery and its branches grossly normal. Left carotid system: Left common carotid artery patent from its origin to the bifurcation. Eccentric calcified plaque about the proximal left ICA with associated narrowing of approximately 30% by NASCET criteria. Distally, left ICA patent to the skullbase without additional stenosis, dissection, or occlusion. Multi focal atheromatous irregularity without high-grade stenosis. Left external carotid artery and its branches within normal limits. Vertebral arteries:Both vertebral arteries arise from the subclavian arteries. Right vertebral artery is dominant. Vertebral arteries patent along their entire course without  stenosis, dissection, or occlusion. Skeleton: No acute osseous abnormality. Multilevel degenerative spondylolysis within the cervical spine, most prevalent at C5-6 and C6-7. Well-circumscribed lucent lesion within the C5 vertebral body favored to be benign. Other neck: Partially visualized lungs are clear. Superior mediastinum demonstrates no abnormality. Thyroid gland normal. No significant adenopathy. No acute soft tissue abnormality. Patient is intubated. CTA HEAD Anterior circulation: Petrous segments are widely patent bilaterally. Scattered calcified atheromatous plaque within the cavernous ICAs without flow-limiting stenosis. Supraclinoid segments widely patent. A1 segments patent. Previous identified moderate to severe or right A1 and more mild left a 1 stenosis is stable. Right A1 segment is hypoplastic, which likely accounts for the slightly diminutive right ICA as compared to the left. Anterior communicating artery normal. Partially visualized anterior cerebral arteries patent. M1 segments patent without stenosis or occlusion. MCA bifurcations normal. No proximal M2 branch occlusion or stenosis. Distal MCA branches not well evaluated on this exam due to technical error. Probable distal small vessel atheromatous irregularity suspected within the left MCA branches. Posterior circulation: Vertebral arteries patent to the vertebrobasilar junction. Right vertebral artery dominant. Posterior inferior cerebral arteries patent. Basilar artery patent to its distal aspect. Superior cerebral arteries patent bilaterally. Both posterior cerebral arteries arise from the basilar artery and are well opacified to their distal aspects. Venous sinuses: Not well evaluated on this exam due to timing  of the contrast bolus and technical error. Anatomic variants: No anatomic variant.  No aneurysm. Delayed phase: Not performed. IMPRESSION: CTA NECK IMPRESSION: 1. No flow limiting or critical stenosis identified within the major  arterial vasculature of the neck. 2. Atheromatous plaque at the proximal ICAs with associated stenoses of approximately 30% by NASCET criteria bilaterally. CTA HEAD IMPRESSION: 1. No large or proximal arterial branch occlusion within the intracranial circulation. No high-grade or correctable stenosis. 2. Atheromatous plaque throughout the cavernous ICAs with secondary mild to moderate multi focal irregularity and narrowing. 3. Stable moderate to severe right and more mild proximal left ACA stenoses. A page has been put into the neurohospitalist on call at the time of this dictation. Electronically Signed   By: Rise Mu M.D.   On: 04/23/2016 04:08   Ct Head Wo Contrast  04/23/2016  CLINICAL DATA:  Code stroke.  Initial encounter. EXAM: CT HEAD WITHOUT CONTRAST TECHNIQUE: Contiguous axial images were obtained from the base of the skull through the vertex without intravenous contrast. COMPARISON:  CT of the head performed 07/27/2015 FINDINGS: There is no evidence of acute infarction, mass lesion, or intra- or extra-axial hemorrhage on CT. Prominence of the ventricles and sulci reflects mild to moderate cortical volume loss. Scattered periventricular and subcortical white matter change likely reflects small vessel ischemic microangiopathy. The brainstem and fourth ventricle are within normal limits. The basal ganglia are unremarkable in appearance. The cerebral hemispheres demonstrate grossly normal gray-white differentiation. No mass effect or midline shift is seen. There is no evidence of fracture; visualized osseous structures are unremarkable in appearance. The orbits are within normal limits. The paranasal sinuses and mastoid air cells are well-aerated. No significant soft tissue abnormalities are seen. IMPRESSION: 1. No acute intracranial pathology seen on CT. 2. Mild to moderate cortical volume loss and scattered small vessel ischemic microangiopathy. These results were called by telephone at the  time of interpretation on 04/23/2016 at 2:25 am to Dr. Cena Benton, who verbally acknowledged these results. Electronically Signed   By: Roanna Raider M.D.   On: 04/23/2016 02:25   Ct Angio Neck W/cm &/or Wo/cm  04/23/2016  CLINICAL DATA:  Initial evaluation for acute seizure, right gaze deviation. EXAM: CT ANGIOGRAPHY HEAD AND NECK TECHNIQUE: Multidetector CT imaging of the head and neck was performed using the standard protocol during bolus administration of intravenous contrast. Multiplanar CT image reconstructions and MIPs were obtained to evaluate the vascular anatomy. Carotid stenosis measurements (when applicable) are obtained utilizing NASCET criteria, using the distal internal carotid diameter as the denominator. CONTRAST:  80 cc of Isovue 370. COMPARISON:  Prior noncontrast head CT from earlier same day. FINDINGS: Study is limited by a technical error, with the mid and superior aspect of the the brain not visualized on arterial phase imaging. CTA NECK Aortic arch: Visualized aortic arch of normal caliber with normal 3 vessel morphology. Scattered calcified plaque within the arch itself and about the origin of the great vessels without flow-limiting stenosis. Scattered calcified plaque within the proximal subclavian arteries with mild narrowing. Subclavian arteries otherwise patent. Right carotid system: Right common carotid artery patent from its origin to the bifurcation. Mild plaque about the proximal right ICA with associated narrowing of approximately 30% by NASCET criteria. Probable small penetrating ulcer present at this level as well. Right ICA patent distally without stenosis, occlusion, or dissection. Multifocal atheromatous irregularity within the right ICA without high-grade stenosis. Right external carotid artery and its branches grossly normal. Left carotid system: Left common carotid  artery patent from its origin to the bifurcation. Eccentric calcified plaque about the proximal left ICA with  associated narrowing of approximately 30% by NASCET criteria. Distally, left ICA patent to the skullbase without additional stenosis, dissection, or occlusion. Multi focal atheromatous irregularity without high-grade stenosis. Left external carotid artery and its branches within normal limits. Vertebral arteries:Both vertebral arteries arise from the subclavian arteries. Right vertebral artery is dominant. Vertebral arteries patent along their entire course without stenosis, dissection, or occlusion. Skeleton: No acute osseous abnormality. Multilevel degenerative spondylolysis within the cervical spine, most prevalent at C5-6 and C6-7. Well-circumscribed lucent lesion within the C5 vertebral body favored to be benign. Other neck: Partially visualized lungs are clear. Superior mediastinum demonstrates no abnormality. Thyroid gland normal. No significant adenopathy. No acute soft tissue abnormality. Patient is intubated. CTA HEAD Anterior circulation: Petrous segments are widely patent bilaterally. Scattered calcified atheromatous plaque within the cavernous ICAs without flow-limiting stenosis. Supraclinoid segments widely patent. A1 segments patent. Previous identified moderate to severe or right A1 and more mild left a 1 stenosis is stable. Right A1 segment is hypoplastic, which likely accounts for the slightly diminutive right ICA as compared to the left. Anterior communicating artery normal. Partially visualized anterior cerebral arteries patent. M1 segments patent without stenosis or occlusion. MCA bifurcations normal. No proximal M2 branch occlusion or stenosis. Distal MCA branches not well evaluated on this exam due to technical error. Probable distal small vessel atheromatous irregularity suspected within the left MCA branches. Posterior circulation: Vertebral arteries patent to the vertebrobasilar junction. Right vertebral artery dominant. Posterior inferior cerebral arteries patent. Basilar artery patent to  its distal aspect. Superior cerebral arteries patent bilaterally. Both posterior cerebral arteries arise from the basilar artery and are well opacified to their distal aspects. Venous sinuses: Not well evaluated on this exam due to timing of the contrast bolus and technical error. Anatomic variants: No anatomic variant.  No aneurysm. Delayed phase: Not performed. IMPRESSION: CTA NECK IMPRESSION: 1. No flow limiting or critical stenosis identified within the major arterial vasculature of the neck. 2. Atheromatous plaque at the proximal ICAs with associated stenoses of approximately 30% by NASCET criteria bilaterally. CTA HEAD IMPRESSION: 1. No large or proximal arterial branch occlusion within the intracranial circulation. No high-grade or correctable stenosis. 2. Atheromatous plaque throughout the cavernous ICAs with secondary mild to moderate multi focal irregularity and narrowing. 3. Stable moderate to severe right and more mild proximal left ACA stenoses. A page has been put into the neurohospitalist on call at the time of this dictation. Electronically Signed   By: Rise Mu M.D.   On: 04/23/2016 04:08   Mr Brain Wo Contrast  04/23/2016  CLINICAL DATA:  Initial evaluation for acute seizure, right gaze deviation. EXAM: MRI HEAD WITHOUT CONTRAST TECHNIQUE: Multiplanar, multiecho pulse sequences of the brain and surrounding structures were obtained without intravenous contrast. COMPARISON:  Prior studies from earlier same day as well as previous brain MRI from 07/27/2015. FINDINGS: Diffuse prominence of the CSF containing spaces is compatible with generalized age-related cerebral atrophy, progressed relative to most recent MRI. Confluent T2/FLAIR hyperintensity involving the periventricular and deep greater than subcortical white matter has also progressed relative to prior. Chronic small vessel ischemic changes present within the pons. Numerous dilated perivascular spaces within the basal ganglia,  similar to prior. Remote lacunar infarct with chronic hemosiderin staining present within the body of the right caudate/right internal capsule. Additional small chronic lacunar infarctions within the bilateral thalami. Small chronic micro hemorrhages within the pons  as well. Probable additional tiny remote left cerebellar infarct. Mildly hyperintense 9 mm diffusion abnormality present within the right periatrial white matter (series 4, image 26). This likely reflects the sequela of acute/early subacute small vessel ischemia. No other acute infarct. Gray-white matter differentiation maintained. Major intracranial vascular flow voids are preserved. No acute intracranial hemorrhage. The no mass lesion, midline shift, or mass effect. Ventricular prominence related to global parenchymal volume loss of hydrocephalus. Hippocampi are symmetric in size and appearance with normal signal intensity and morphology. No extra-axial fluid collection. Scattered FLAIR sequence seen throughout the supratentorial brain on this exam favored to related to motion artifact. Craniocervical junction normal. Visualized upper cervical spine unremarkable. 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 No acute abnormality about the orbits. Sequela prior lens extraction noted bilaterally. Fluid within the posterior nasopharynx. Patient is intubated. Paranasal sinuses are otherwise clear. Trace opacity within the right mastoid air cells. Mastoid air cells are otherwise clear. Inner ear structures normal. Bone marrow signal intensity normal. No scalp soft tissue abnormality. IMPRESSION: 1. Mildly hyperintense 9 mm diffusion abnormality within the right periatrial white matter, likely reflecting acute/early subacute small vessel ischemia. 2. No other acute intracranial process. 3. Moderate cerebral atrophy with chronic small vessel ischemic disease, progressed relative to most recent MRI from 7/21 07/27/2015. 4. Chronic hemorrhagic right basal ganglia  infarct, with additional chronic lacunar infarcts within the thalami, stable. Electronically Signed   By: Rise MuBenjamin  McClintock M.D.   On: 04/23/2016 05:33   Dg Chest Portable 1 View  04/23/2016  CLINICAL DATA:  Endotracheal tube and orogastric tube placement. Initial encounter. EXAM: PORTABLE CHEST 1 VIEW COMPARISON:  Chest radiograph performed 07/26/2015 FINDINGS: The patient's endotracheal tube is seen ending 1 cm above the carina. This could be retracted 2 cm. The patient's enteric tube is seen extending below the diaphragm. The lungs appear relatively clear. No pleural effusion or pneumothorax is seen. The cardiomediastinal silhouette is normal in size. No acute osseous abnormalities are identified. IMPRESSION: 1. Endotracheal tube seen ending 1 cm above the carina. This could be retracted 2 cm. 2. Enteric tube noted extending below the diaphragm. 3. Lungs clear bilaterally. These results were called by telephone at the time of interpretation on 04/23/2016 at 2:17 am to Dr. Dione BoozeAVID GLICK, who verbally acknowledged these results. Electronically Signed   By: Roanna RaiderJeffery  Chang M.D.   On: 04/23/2016 02:17    STUDIES:  CT and CTA head as above CXR  CULTURES: None  ANTIBIOTICS: None  SIGNIFICANT EVENTS:   LINES/TUBES: 4/24 ETT 4/24 OGT 4/24 Foley 4/24 PIV  DISCUSSION: 45F w/ history of prior strokes presents with new onset status epilepticus. No prior history of seizures. There is concern for underlying stroke as a potential inciting event, but imaging has thus far been unrevealing. She requires ventilatory support as she is unable to protect her airway. She is also hyperglycemic with an anion gap of 21 and HCO3 19. Lactate and UA have yet to be obtained.   ASSESSMENT / PLAN:  PULMONARY A: Acute respiratory insufficiency / inability to protect airway 2/2 status epilepticus P:   Full vent support. Wean as tolerated. SBT when mental status is addressed.  CARDIOVASCULAR A:  Hypertension  / labile BP A flutter - new Hx 1st degree AV block P:  DC levophed. Add labetalol PO for BP and HR control. Consider echo if BP remains labile.  RENAL A:   Metabolic acidosis - lactic vs ketoacids P:   IVF to 50 ml/hr until TF  are at goal. Trend lactate. Insulin GTT. Follow lytes  GASTROINTESTINAL A:   No acute issues P:   Consult nutrition for TF.  HEMATOLOGIC A:   No acute issues P:  Monitor If no intervention after MRI, start chemical dvt ppx  INFECTIOUS A:   No acute issues P:   Monitor  ENDOCRINE A:   Diabetes with marked hyperglycemia P:   Insulin gtt Accuchecks   NEUROLOGIC A:   Status epilepticus - underlying etiology unclear P:   Continue sedation for seizure suppression only, off propofol right now and no seizure. Continue Keppra, Vimpat per neurology. Await MRI. Continuous EEG. Correct metabolic disturbances (acidosis, hyperglycemia).  FAMILY  - Updates: No family available; pt to remain full code until further discussion is possible.   - Inter-disciplinary family meet or Palliative Care meeting due by:  day 7  The patient is critically ill with multiple organ systems failure and requires high complexity decision making for assessment and support, frequent evaluation and titration of therapies, application of advanced monitoring technologies and extensive interpretation of multiple databases.   Critical Care Time devoted to patient care services described in this note is  35  Minutes. This time reflects time of care of this signee Dr Koren Bound. This critical care time does not reflect procedure time, or teaching time or supervisory time of PA/NP/Med student/Med Resident etc but could involve care discussion time.  Alyson Reedy, M.D. [facility] Pulmonary/Critical Care Medicine. Pager: [phone]. After hours pager: [phone].  04/23/2016, 9:19 AM

## 2016-04-23 NOTE — ED Notes (Signed)
2mg Ativan given.

## 2016-04-23 NOTE — ED Notes (Signed)
Patient and RN in MRI.

## 2016-04-23 NOTE — ED Notes (Signed)
1mg of ativan given

## 2016-04-23 NOTE — Progress Notes (Signed)
Subjective: No further eseizures  Exam: Filed Vitals:   04/23/16 0900 04/23/16 0951  BP: 175/93 156/91  Pulse: 97 101  Temp:    Resp: 15    Gen: In bed, NAD Resp: non-labored breathing, no acute distress Abd: soft, nt  Neuro: MS: opens eyes minimally, does not follow commands.  ZO:XWRUECN:PERRL Motor: flexes left arm to noxious sitmulation, though possibly some spontaneous movements as well. Increased tone.  Sensory:withdraws to nox stim bilaterally.   Pertinent Labs: Elevated glucose.   Impression: 75 yo F with new onset seizures in the setting of hyperglycemia and ious strokes. She has had episodes of increased onte in the left arm, unclear if seizures, would like to capture one and continuous EEG has been started.   Recommendations: 1) continue keppra, vimpat.  2) EEG 3) will follow.   Ritta SlotMcNeill Cherlyn Syring, MD [facility] [phone]  If 7pm- 7am, please page neurology on call as listed in AMION.

## 2016-04-23 NOTE — Procedures (Signed)
Central Venous Catheter Insertion Procedure Note Dana Blair [id number] 06-10-1941  Procedure: Insertion of Central Venous Catheter Indications: Assessment of intravascular volume, Drug and/or fluid administration and Frequent blood sampling  Procedure Details Consent: Risks of procedure as well as the alternatives and risks of each were explained to the (patient/caregiver).  Consent for procedure obtained. Time Out: Verified patient identification, verified procedure, site/side was marked, verified correct patient position, special equipment/implants available, medications/allergies/relevent history reviewed, required imaging and test results available.  Performed  Maximum sterile technique was used including antiseptics, cap, gloves, gown, hand hygiene, mask and sheet. Skin prep: Chlorhexidine; local anesthetic administered A antimicrobial bonded/coated triple lumen catheter was placed in the left internal jugular vein using the Seldinger technique.  Evaluation Blood flow good Complications: No apparent complications Patient did tolerate procedure well. Chest X-ray ordered to verify placement.  CXR: pending.  Procedure performed under direct ultrasound guidance for real time vessel cannulation.      Dana Blair, [place] Dana Blair Pulmonary & Critical Care Medicine Pager: [phone]  or [phone] 04/23/2016, 12:29 PM  Dana ReedyWesam G. Jaidalyn Blair, M.D. [facility]. Pager: [phone]. After hours pager: [phone].

## 2016-04-23 NOTE — Progress Notes (Signed)
Inpatient Diabetes Program Recommendations  AACE/ADA: New Consensus Statement on Inpatient Glycemic Control (2015)  Target Ranges:  Prepandial:   less than 140 mg/dL      Peak postprandial:   less than 180 mg/dL (1-2 hours)      Critically ill patients:  140 - 180 mg/dL   Review of Glycemic Control  Diabetes history: DM Type 2 Outpatient Diabetes medications: Janumet 50-1000 mg bid Current orders for Inpatient glycemic control: IV insulin drip per glucostablizer  Inpatient Diabetes Program Recommendations:  Noted A1c pending and on IV insulin drip. Will Follow.  Thank you, Dana FischerJudy E. Luan Urbani, RN, MSN, CDE Inpatient Glycemic Control Team Team Pager [phone] (8am-5pm) 04/23/2016 10:22 AM

## 2016-04-23 NOTE — ED Notes (Signed)
Neuro MD advised glucose 670

## 2016-04-23 NOTE — Progress Notes (Signed)
LTM up and running, no skin breakdown seen

## 2016-04-23 NOTE — ED Notes (Signed)
Per EMS patient comes from home states family ate dinner at 2200 when he walked into her room patient was having "shaking movements." Patient right gaze. Patient right and left arm contracted. EMS Pressure 262/140  Patient given a total of 5mg  versed with EMS. Patient seizure on arrival verbal by EDP for 2mg  ativan.

## 2016-04-23 NOTE — Consult Note (Signed)
Neurology Consultation Reason for Consult: code stroke Referring Physician: dr Preston Fleetingglick  CC: seizing right side  History is obtained from:EMS  HPI: Dana Blair is a 75 y.o. 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  She was brought to the ED.  She was noticed to be in status epilepticus with mostly right sided tonic clonic movements, but occasionally crossing over to the left.  She required rapid intubation as she was dessating to the 70s in the field.  CT head was negative. By the time the CT head was accomplished considering the delays the pt was outside just 10 min from tPA 4.5h window and we were unable to treat her. The pt was also noted to be in afib, which appears to be new onset as this had not been noticed before.  CTA ordered STAT and on my perusal found to be negative.    LKW: 10pm tpa given?: no, pt outside window  ROS:  Unable to obtain due to altered mental status.   Past Medical History  Diagnosis Date  . Diabetes     Diabetes mellitus with chronic kidney disease   . Hypertension     [facility]- ECHO 06/29/09 - LVH, normal EF, renal ultrasound-no renal artery stenosis  . Hyperlipidemia   . 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 She had concomitant respiratory failure. On May 22, 2008, CT of the head without contrast demonstrated a 9 x 20 mm acute hematoma within the head of the right caudate with intraventricular extension of hemorrhage. Neurosurgery consult-no ventriculostomy.  . Glaucoma   . Osteopenia   . Chronic kidney disease   . Syncope 07/26/2015    Family History  Problem Relation Age of Onset  . Diabetes Mother   . Diabetes Father   . Diabetes Sister     Social History:  reports that she has never smoked. She has never used smokeless tobacco. She reports that she does not drink  alcohol or use illicit drugs.  Unable to obtain  Exam: Current vital signs: BP 160/108 mmHg  Pulse 148  Resp 35  Ht 5\' 4"  (1.626 m)  Wt 51.7 kg (113 lb 15.7 oz)  BMI 19.55 kg/m2  SpO2 100% Vital signs in last 24 hours: Pulse Rate:  [148-166] 148 (04/24 0158) Resp:  [35] 35 (04/24 0150) BP: (160-172)/(108-115) 160/108 mmHg (04/24 0158) SpO2:  [100 %] 100 % (04/24 0158) FiO2 (%):  [100 %] 100 % (04/24 0200) Weight:  [51.7 kg (113 lb 15.7 oz)] 51.7 kg (113 lb 15.7 oz) (04/24 0200)   Physical Exam  Constitutional: Frail, elderly Psych:obtunded, seizing. Eyes: No scleral injection HENT: No OP obstrucion Head: Normocephalic.  Cardiovascular: Normal rate and regular rhythm.  Respiratory: Effort normal and breath sounds normal to anterior ascultation GI: Soft.  No distension. There is no tenderness.  Skin: WDI  Neuro: Mental Status: Intubated sedated right gaze deviation and right arm shaking right face shaking Cranial Nerves: Unable to test. Right gaze deviation right facial twitching ongoing Motor: GTC movements R>L Sensory: Unable to test - no resopnse Deep Tendon Reflexes: none Plantars: silent Cerebellar: Unable to test  I have reviewed labs in epic and the results pertinent to this consultation are: normal renal function. Severe hyperglycemia - please treat as needed.  I have  reviewed the images obtained: CT head.    Impression: 1) status epilepticus - started on propofol keppra and vimpat with resolution.  Patient will be admited to the ICU by pulm attending.  MRI when possible - no contrast needed.  2) severe hyperglycemia - this is deferred to criotical care medicine.  Case will be signed out to day neurohospitalist team.  Recommendations: 1) as above

## 2016-04-23 NOTE — Plan of Care (Signed)
Dr. Molli KnockYacoub notified of decreased BP and start of levophed, aware of urine output and labile glucose. Aware of need to speak with family.

## 2016-04-23 NOTE — Progress Notes (Addendum)
Initial Nutrition Assessment  DOCUMENTATION CODES:   Not applicable  INTERVENTION:   Initiate Vital AF 1.2 formula at 15 ml/hr and increase by 10 ml every 4 hours to goal rate of 35 ml/hr   Prostat liquid protein 30 ml daily   Liquid MVI daily  Above TF regimen to provide 1108 total kcals, 78 gm protein, 681 ml of free water  NUTRITION DIAGNOSIS:   Inadequate oral intake related to inability to eat as evidenced by NPO status  GOAL:   Patient will meet greater than or equal to 90% of their needs   MONITOR:   TF tolerance, Vent status, Labs, Weight trends, I & O's  REASON FOR ASSESSMENT:   Consult Enteral/tube feeding initiation and management  ASSESSMENT:   53F with history of prior ischemic and hemorrhagic strokes who presents to the ED via EMS after her family found her seizing with R gaze deviation. Last known normal was around 10pm on 4/23. She continued to seize with mostly right-sided involvement, but occasional left-sided symptoms as well. She received 5mg  versed en route. She was desaturating and required intubation. CT head was negative for blood. By the time imaging was obtained she was outside the window for TPA. CTA was ordered. On arrival to the ED she was also in atrial fibrillation, which reportedly is new for her. She was placed on propofol for seizure suppression, loaded with keppra, given additional benzos, and vimpat was also added.  Patient is currently intubated on ventilator support -- OGT in place MV: 6.0 L/min Temp (24hrs), Avg:98.1 F (36.7 C), Min:97.3 F (36.3 C), Max:98.7 F (37.1 C)   RD unable to complete Nutrition Focused Physical Exam at this time.  CCM note reviewed.  Status epilepticus - underlying etiology unclear.  Diet Order:  Diet NPO time specified  Skin:  Reviewed, no issues   CBG (last 3)   Recent Labs  04/23/16 1125 04/23/16 1230 04/23/16 1334  GLUCAP 138* 153* 75    Last BM:  N/A  Height:   Ht Readings from  Last 1 Encounters:  04/23/16 5\' 4"  (1.626 m)    Weight:   Wt Readings from Last 1 Encounters:  04/23/16 113 lb 1.5 oz (51.3 kg)    Ideal Body Weight:  54.4 kg  BMI:  Body mass index is 19.4 kg/(m^2).  Estimated Nutritional Needs:   Kcal:  1126  Protein:  80-90 gm  Fluid:  >/= 1.5 L  EDUCATION NEEDS:   No education needs identified at this time  Maureen ChattersKatie Shellye Zandi, RD, LDN Pager #: [phone] After-Hours Pager #: [phone]

## 2016-04-23 NOTE — H&P (Signed)
PULMONARY / CRITICAL CARE MEDICINE   Name: Dana Blair MRN: [id number] DOB: October 09, 1941    ADMISSION DATE:  04/23/2016 CONSULTATION DATE:  04/23/16  REFERRING MD:  EDP  CHIEF COMPLAINT:  seizures  HISTORY OF PRESENT ILLNESS:   Dana Blair is a 34F with history of prior ischemic and hemorrhagic strokes who presents to the ED via EMS after her family found her seizing with R gaze deviation. Last known normal was around 10pm on 4/23. She continued to seize with mostly right-sided involvement, but occasional left-sided symptoms as well. She received  versed en route. She was desaturating and required intubation. CT head was negative for blood. By the time imaging was obtained she was outside the window for TPA. CTA was ordered. On arrival to the ED she was also in atrial fibrillation, which reportedly is new for her. She was placed on propofol for seizure suppression, loaded with keppra, given additional benzos, and vimpat was also added.  Currently, she is intubated and sedated. No family is available for additional history. Her BP has been labile and a balance with sedation has been difficult to achieve. She appears to be intermittently seizing, now with more pronounced symptoms on the left.   PAST MEDICAL HISTORY :  She  has a past medical history of Diabetes (HCC); Hypertension; Hyperlipidemia; Stroke [facility]); Glaucoma; Osteopenia; Chronic kidney disease; and Syncope (07/26/2015).  PAST SURGICAL HISTORY: She  has past surgical history that includes Other surgical history (1970); Abdominal hysterectomy; and Flexible sigmoidoscopy (N/A, 07/30/2015).  No Known Allergies  No current facility-administered medications on file prior to encounter.   Current Outpatient Prescriptions on File Prior to Encounter  Medication Sig  . alendronate (FOSAMAX) 70 MG tablet Take 70 mg by mouth once a week. Take with a full glass of water on an empty stomach.  Marland Kitchen aspirin 81 MG tablet Take 81 mg by mouth  daily.  . nebivolol (BYSTOLIC) 5 MG tablet Take 1 tablet (5 mg total) by mouth daily.  . sitaGLIPtin-metformin (JANUMET) 50-1000 MG per tablet Take 1 tablet by mouth 2 (two) times daily with a meal.  . VYTORIN 10-20 MG tablet TAKE ONE TABLET BY MOUTH ONCE DAILY    FAMILY HISTORY:  Her indicated that her mother is deceased.   SOCIAL HISTORY: She  reports that she has never smoked. She has never used smokeless tobacco. She reports that she does not drink alcohol or use illicit drugs.  REVIEW OF SYSTEMS:   Unable to obtain given intubated and sedated state  SUBJECTIVE:    VITAL SIGNS: BP 209/127 mmHg  Pulse 117  Resp 14  Ht  (1.626 m)  Wt 51.7 kg (113 lb 15.7 oz)  BMI 19.55 kg/m2  SpO2 100%  HEMODYNAMICS:    VENTILATOR SETTINGS: Vent Mode:  [-] PRVC FiO2 (%):  [100 %] 100 % Set Rate:  [14 bmp] 14 bmp Vt Set:  [440 mL] 440 mL PEEP:  [5 cmH20] 5 cmH20 Plateau Pressure:  [15 cmH20] 15 cmH20  INTAKE / OUTPUT:    PHYSICAL EXAMINATION:  General Well nourished, well developed, no apparent distress  HEENT No gross abnormalities. ETT/OGT in place  Pulmonary Clear to auscultation bilaterally with no wheezes, rales or ronchi. Good effort, symmetrical expansion.   Cardiovascular Tachycardic, regular rhythm. S1, s2. No m/r/g. Distal pulses palpable.  Abdomen Soft, non-tender, non-distended, positive bowel sounds, no palpable organomegaly or masses. Normoresonant to percussion.  Musculoskeletal No bony abnormalities  Lymphatics No cervical, supraclavicular or axillary adenopathy.  Neurologic Pupils reactive, gaze fixed relatively midline. Withdraws to noxious stimuli on the left, no response on the right. No commands.   Skin/Integuement Diffuse actinic keratoses, some areas on the lower abdomen that are more scaly appearing. No cyanosis, no clubbing.     LABS:  BMET  Recent Labs Lab 04/23/16 0150 04/23/16 0157  NA 135 136  K 4.2 4.2  CL 95* 96*  CO2 19*  --    BUN 21* 25*  CREATININE 1.39* 1.00  GLUCOSE 670* 630*    Electrolytes  Recent Labs Lab 04/23/16 0150  CALCIUM 9.4    CBC  Recent Labs Lab 04/23/16 0150 04/23/16 0157  WBC 10.7*  --   HGB 15.0 17.7*  HCT 46.3* 52.0*  PLT 260  --     Coag's  Recent Labs Lab 04/23/16 0150  INR 0.87    Sepsis Markers No results for input(s): LATICACIDVEN, PROCALCITON, O2SATVEN in the last 168 hours.  ABG No results for input(s): PHART, PCO2ART, PO2ART in the last 168 hours.  Liver Enzymes  Recent Labs Lab 04/23/16 0150  AST 42*  ALT 30  ALKPHOS 126  BILITOT 0.7  ALBUMIN 3.9    Cardiac Enzymes No results for input(s): TROPONINI, PROBNP in the last 168 hours.  Glucose No results for input(s): GLUCAP in the last 168 hours.  Imaging Ct Head Wo Contrast  04/23/2016  CLINICAL DATA:  Code stroke.  Initial encounter. EXAM: CT HEAD WITHOUT CONTRAST TECHNIQUE: Contiguous axial images were obtained from the base of the skull through the vertex without intravenous contrast. COMPARISON:  CT of the head performed 07/27/2015 FINDINGS: There is no evidence of acute infarction, mass lesion, or intra- or extra-axial hemorrhage on CT. Prominence of the ventricles and sulci reflects mild to moderate cortical volume loss. Scattered periventricular and subcortical white matter change likely reflects small vessel ischemic microangiopathy. The brainstem and fourth ventricle are within normal limits. The basal ganglia are unremarkable in appearance. The cerebral hemispheres demonstrate grossly normal gray-white differentiation. No mass effect or midline shift is seen. There is no evidence of fracture; visualized osseous structures are unremarkable in appearance. The orbits are within normal limits. The paranasal sinuses and mastoid air cells are well-aerated. No significant soft tissue abnormalities are seen. IMPRESSION: 1. No acute intracranial pathology seen on CT. 2. Mild to moderate cortical  volume loss and scattered small vessel ischemic microangiopathy. These results were called by telephone at the time of interpretation on 04/23/2016 at 2:25 am to Dr. Cena BentonVega, who verbally acknowledged these results. Electronically Signed   By: Roanna RaiderJeffery  Chang M.D.   On: 04/23/2016 02:25   Dg Chest Portable 1 View  04/23/2016  CLINICAL DATA:  Endotracheal tube and orogastric tube placement. Initial encounter. EXAM: PORTABLE CHEST 1 VIEW COMPARISON:  Chest radiograph performed 07/26/2015 FINDINGS: The patient's endotracheal tube is seen ending 1 cm above the carina. This could be retracted 2 cm. The patient's enteric tube is seen extending below the diaphragm. The lungs appear relatively clear. No pleural effusion or pneumothorax is seen. The cardiomediastinal silhouette is normal in size. No acute osseous abnormalities are identified. IMPRESSION: 1. Endotracheal tube seen ending 1 cm above the carina. This could be retracted 2 cm. 2. Enteric tube noted extending below the diaphragm. 3. Lungs clear bilaterally. These results were called by telephone at the time of interpretation on 04/23/2016 at 2:17 am to Dr. Dione BoozeAVID GLICK, who verbally acknowledged these results. Electronically Signed   By: Roanna RaiderJeffery  Chang M.D.   On:  04/23/2016 02:17    STUDIES:  CT and CTA head as above CXR  CULTURES: None  ANTIBIOTICS: None  SIGNIFICANT EVENTS:   LINES/TUBES: 4/24 ETT 4/24 OGT 4/24 Foley 4/24 PIV  DISCUSSION: 30F w/ history of prior strokes presents with new onset status epilepticus. No prior history of seizures. There is concern for underlying stroke as a potential inciting event, but imaging has thus far been unrevealing. She requires ventilatory support as she is unable to protect her airway. She is also hyperglycemic with an anion gap of 21 and HCO3 19. Lactate and UA have yet to be obtained.   ASSESSMENT / PLAN:  PULMONARY A: Acute respiratory insufficiency / inability to protect airway 2/2 status  epilepticus P:   Ventilatory support Wean as tolerated SBT when able  CARDIOVASCULAR A:  Hypertension / labile BP A flutter - new Hx 1st degree AV block P:  Sedate adequately, then medicate BP if needed Rate control Await imaging to consider a/c   RENAL A:   Metabolic acidosis - lactic vs ketoacids P:   Check UA IVF Trend lactate Insulin gtt Follow lytes Check CPK to evaluate for rhabdo  GASTROINTESTINAL A:   No acute issues P:   NPO  HEMATOLOGIC A:   No acute issues P:  Monitor If no intervention after MRI, start chemical dvt ppx  INFECTIOUS A:   No acute issues P:   Monitor  ENDOCRINE A:   Diabetes with marked hyperglycemia P:   Insulin gtt accuchecks   NEUROLOGIC A:   Status epilepticus - underlying etiology unclear P:   RASS goal: -4 Continue sedation for seizure suppression - propofol for now Continue Keppra, Vimpat per neurology Await MRI LTM Correct metabolic disturbances (acidosis, hyperglycemia)   FAMILY  - Updates: No family available; pt to remain full code until further discussion is possible.   - Inter-disciplinary family meet or Palliative Care meeting due by:  day 7  The patient is critically ill with multiple organ system failure and requires high complexity decision making for assessment and support, frequent evaluation and titration of therapies, advanced monitoring, review of radiographic studies and interpretation of complex data.   Critical Care Time devoted to patient care services, exclusive of separately billable procedures, described in this note is 52 minutes.   Nita Sickle, MD Pulmonary and Critical Care Medicine [facility] Pager: [phone]  04/23/2016, 2:51 AM

## 2016-04-24 ENCOUNTER — Inpatient Hospital Stay (HOSPITAL_COMMUNITY): Payer: Medicare Other

## 2016-04-24 DIAGNOSIS — J9601 Acute respiratory failure with hypoxia: Secondary | ICD-10-CM

## 2016-04-24 DIAGNOSIS — G9340 Encephalopathy, unspecified: Secondary | ICD-10-CM

## 2016-04-24 LAB — GLUCOSE, CAPILLARY
GLUCOSE-CAPILLARY: 111 mg/dL — AB (ref 65–99)
GLUCOSE-CAPILLARY: 114 mg/dL — AB (ref 65–99)
GLUCOSE-CAPILLARY: 119 mg/dL — AB (ref 65–99)
GLUCOSE-CAPILLARY: 132 mg/dL — AB (ref 65–99)
GLUCOSE-CAPILLARY: 149 mg/dL — AB (ref 65–99)
GLUCOSE-CAPILLARY: 153 mg/dL — AB (ref 65–99)
GLUCOSE-CAPILLARY: 157 mg/dL — AB (ref 65–99)
GLUCOSE-CAPILLARY: 185 mg/dL — AB (ref 65–99)
GLUCOSE-CAPILLARY: 188 mg/dL — AB (ref 65–99)
GLUCOSE-CAPILLARY: 189 mg/dL — AB (ref 65–99)
GLUCOSE-CAPILLARY: 202 mg/dL — AB (ref 65–99)
GLUCOSE-CAPILLARY: 218 mg/dL — AB (ref 65–99)
GLUCOSE-CAPILLARY: 221 mg/dL — AB (ref 65–99)
GLUCOSE-CAPILLARY: 249 mg/dL — AB (ref 65–99)
Glucose-Capillary: 113 mg/dL — ABNORMAL HIGH (ref 65–99)
Glucose-Capillary: 120 mg/dL — ABNORMAL HIGH (ref 65–99)
Glucose-Capillary: 138 mg/dL — ABNORMAL HIGH (ref 65–99)
Glucose-Capillary: 145 mg/dL — ABNORMAL HIGH (ref 65–99)
Glucose-Capillary: 148 mg/dL — ABNORMAL HIGH (ref 65–99)
Glucose-Capillary: 158 mg/dL — ABNORMAL HIGH (ref 65–99)
Glucose-Capillary: 161 mg/dL — ABNORMAL HIGH (ref 65–99)
Glucose-Capillary: 189 mg/dL — ABNORMAL HIGH (ref 65–99)
Glucose-Capillary: 205 mg/dL — ABNORMAL HIGH (ref 65–99)
Glucose-Capillary: 208 mg/dL — ABNORMAL HIGH (ref 65–99)
Glucose-Capillary: 96 mg/dL (ref 65–99)

## 2016-04-24 LAB — CBC
HCT: 37.8 % (ref 36.0–46.0)
Hemoglobin: 12.6 g/dL (ref 12.0–15.0)
MCH: 26.7 pg (ref 26.0–34.0)
MCHC: 33.3 g/dL (ref 30.0–36.0)
MCV: 80.1 fL (ref 78.0–100.0)
Platelets: 223 10*3/uL (ref 150–400)
RBC: 4.72 MIL/uL (ref 3.87–5.11)
RDW: 13.6 % (ref 11.5–15.5)
WBC: 14.5 10*3/uL — ABNORMAL HIGH (ref 4.0–10.5)

## 2016-04-24 LAB — BASIC METABOLIC PANEL
ANION GAP: 11 (ref 5–15)
BUN: 28 mg/dL — ABNORMAL HIGH (ref 6–20)
CHLORIDE: 107 mmol/L (ref 101–111)
CO2: 23 mmol/L (ref 22–32)
Calcium: 8.9 mg/dL (ref 8.9–10.3)
Creatinine, Ser: 1.28 mg/dL — ABNORMAL HIGH (ref 0.44–1.00)
GFR calc non Af Amer: 40 mL/min — ABNORMAL LOW (ref >60)
GFR, EST AFRICAN AMERICAN: 47 mL/min — AB (ref >60)
Glucose, Bld: 187 mg/dL — ABNORMAL HIGH (ref 65–99)
POTASSIUM: 2.9 mmol/L — AB (ref 3.5–5.1)
Sodium: 141 mmol/L (ref 135–145)

## 2016-04-24 LAB — POCT I-STAT 3, ART BLOOD GAS (G3+)
Acid-Base Excess: 3 mmol/L — ABNORMAL HIGH (ref 0.0–2.0)
BICARBONATE: 26.8 meq/L — AB (ref 20.0–24.0)
O2 Saturation: 100 %
PH ART: 7.475 — AB (ref 7.350–7.450)
Patient temperature: 98.6
TCO2: 28 mmol/L (ref 0–100)
pCO2 arterial: 36.4 mmHg (ref 35.0–45.0)
pO2, Arterial: 179 mmHg — ABNORMAL HIGH (ref 80.0–100.0)

## 2016-04-24 LAB — HEMOGLOBIN A1C
Hgb A1c MFr Bld: 10.2 % — ABNORMAL HIGH (ref 4.8–5.6)
Mean Plasma Glucose: 246 mg/dL

## 2016-04-24 LAB — PHOSPHORUS: PHOSPHORUS: 2.4 mg/dL — AB (ref 2.5–4.6)

## 2016-04-24 LAB — MAGNESIUM: MAGNESIUM: 2 mg/dL (ref 1.7–2.4)

## 2016-04-24 MED ORDER — LABETALOL HCL 100 MG PO TABS
100.0000 mg | ORAL_TABLET | Freq: Three times a day (TID) | ORAL | Status: DC
  Administered 2016-04-24 – 2016-05-01 (×18): 100 mg via ORAL
  Filled 2016-04-24 (×18): qty 1

## 2016-04-24 MED ORDER — HEPARIN SODIUM (PORCINE) 5000 UNIT/ML IJ SOLN
5000.0000 [IU] | Freq: Three times a day (TID) | INTRAMUSCULAR | Status: DC
  Administered 2016-04-24 – 2016-04-25 (×2): 5000 [IU] via SUBCUTANEOUS
  Filled 2016-04-24 (×2): qty 1

## 2016-04-24 MED ORDER — INSULIN ASPART 100 UNIT/ML ~~LOC~~ SOLN
3.0000 [IU] | SUBCUTANEOUS | Status: DC
  Administered 2016-04-24 – 2016-04-25 (×7): 3 [IU] via SUBCUTANEOUS

## 2016-04-24 MED ORDER — INSULIN GLARGINE 100 UNIT/ML ~~LOC~~ SOLN
20.0000 [IU] | SUBCUTANEOUS | Status: DC
  Administered 2016-04-24: 20 [IU] via SUBCUTANEOUS
  Filled 2016-04-24 (×3): qty 0.2

## 2016-04-24 MED ORDER — INSULIN ASPART 100 UNIT/ML ~~LOC~~ SOLN
0.0000 [IU] | SUBCUTANEOUS | Status: DC
  Administered 2016-04-24: 3 [IU] via SUBCUTANEOUS
  Administered 2016-04-25: 8 [IU] via SUBCUTANEOUS
  Administered 2016-04-25: 5 [IU] via SUBCUTANEOUS
  Administered 2016-04-25: 3 [IU] via SUBCUTANEOUS
  Administered 2016-04-25: 2 [IU] via SUBCUTANEOUS
  Administered 2016-04-25: 3 [IU] via SUBCUTANEOUS
  Administered 2016-04-25: 11 [IU] via SUBCUTANEOUS
  Administered 2016-04-26: 3 [IU] via SUBCUTANEOUS
  Administered 2016-04-26: 2 [IU] via SUBCUTANEOUS
  Administered 2016-04-26 (×2): 3 [IU] via SUBCUTANEOUS
  Administered 2016-04-27 (×3): 2 [IU] via SUBCUTANEOUS
  Administered 2016-04-27: 8 [IU] via SUBCUTANEOUS
  Administered 2016-04-28: 2 [IU] via SUBCUTANEOUS
  Administered 2016-04-28: 3 [IU] via SUBCUTANEOUS
  Administered 2016-04-28: 5 [IU] via SUBCUTANEOUS

## 2016-04-24 MED ORDER — HYDRALAZINE HCL 20 MG/ML IJ SOLN
10.0000 mg | INTRAMUSCULAR | Status: DC | PRN
  Administered 2016-04-24 – 2016-04-25 (×2): 10 mg via INTRAVENOUS
  Filled 2016-04-24 (×2): qty 1

## 2016-04-24 MED ORDER — POTASSIUM CHLORIDE 20 MEQ/15ML (10%) PO SOLN
60.0000 meq | Freq: Once | ORAL | Status: AC
  Administered 2016-04-24: 60 meq
  Filled 2016-04-24: qty 45

## 2016-04-24 MED ORDER — SODIUM CHLORIDE 0.9% FLUSH
10.0000 mL | INTRAVENOUS | Status: DC | PRN
  Administered 2016-04-28: 10 mL
  Filled 2016-04-24: qty 40

## 2016-04-24 MED ORDER — DEXTROSE 10 % IV SOLN: INTRAVENOUS | Status: DC | PRN

## 2016-04-24 MED ORDER — SODIUM CHLORIDE 0.9% FLUSH
10.0000 mL | Freq: Two times a day (BID) | INTRAVENOUS | Status: DC
  Administered 2016-04-24 – 2016-04-29 (×9): 10 mL

## 2016-04-24 NOTE — Progress Notes (Signed)
eLink Physician-Brief Progress Note Patient Name: Dana GlossBarbara J Blair DOB: Oct 12, 1941 MRN: [id number]   Date of Service  04/24/2016  HPI/Events of Note  K+ = 2.9 and Creatinine = 1.28.  eICU Interventions  Will replete K+.     Intervention Category Intermediate Interventions: Electrolyte abnormality - evaluation and management  Temiloluwa Recchia Eugene 04/24/2016, 5:58 AM

## 2016-04-24 NOTE — Progress Notes (Signed)
PULMONARY / CRITICAL CARE MEDICINE   Name: Dana Blair MRN: [id number] DOB: 09-May-1941    ADMISSION DATE:  04/23/2016 CONSULTATION DATE:  04/23/16  REFERRING MD:  EDP  CHIEF COMPLAINT:  seizures  HISTORY OF PRESENT ILLNESS:   Dana Blair is a [age]F with history of prior ischemic and hemorrhagic strokes who presents to the ED via EMS after her family found her seizing with R gaze deviation. Last known normal was around 10pm on 4/23. She continued to seize with mostly right-sided involvement, but occasional left-sided symptoms as well. She received 5mg  versed en route. She was desaturating and required intubation. CT head was negative for blood. By the time imaging was obtained she was outside the window for TPA. CTA was ordered. On arrival to the ED she was also in atrial fibrillation, which reportedly is new for her. She was placed on propofol for seizure suppression, loaded with keppra, given additional benzos, and vimpat was also added.  Currently, she is intubated and sedated. No family is available for additional history. Her BP has been labile and a balance with sedation has been difficult to achieve. She appears to be intermittently seizing, now with more pronounced symptoms on the left.   SUBJECTIVE:  No events overnight, more arousable this AM. No reported seizure activity on EEG.  VITAL SIGNS: BP 174/94 mmHg  Pulse 103  Temp(Src) 98.1 F (36.7 C) (Axillary)  Resp 14  Ht 5\' 4"  (1.626 m)  Wt 54.1 kg (119 lb 4.3 oz)  BMI 20.46 kg/m2  SpO2 100%  HEMODYNAMICS:    VENTILATOR SETTINGS: Vent Mode:  [-] PRVC FiO2 (%):  [40 %] 40 % Set Rate:  [14 bmp] 14 bmp Vt Set:  [440 mL] 440 mL PEEP:  [5 cmH20] 5 cmH20 Plateau Pressure:  [7 cmH20-15 cmH20] 13 cmH20  INTAKE / OUTPUT: I/O last 3 completed shifts: In: 3219.3 [I.V.:1199.3; NG/GT:800; IV Piggyback:1220] Out: 2365 [Urine:2165; Emesis/NG output:200]  PHYSICAL EXAMINATION:  General Well developed, no apparent distress   HEENT No gross abnormalities. ETT/OGT in place, [place]/AT, PERRL, EOM spontaneous.  Pulmonary Clear to auscultation bilaterally with no wheezes, rales or ronchi. No respiratory effort on PS.   Cardiovascular Tachycardic, regular rhythm. S1, s2. No m/r/g. Distal pulses palpable.  Abdomen Soft, non-tender, non-distended, positive bowel sounds, no palpable organomegaly or masses. Normoresonant to percussion.  Musculoskeletal No bony abnormalities  Lymphatics No cervical, supraclavicular or axillary adenopathy.   Neurologic Pupils reactive. Moving left side spontaneously, less movement on the right.  Skin/Integuement Diffuse actinic keratoses, some areas on the lower abdomen that are more scaly appearing. No cyanosis, no clubbing.     LABS:  BMET  Recent Labs Lab 04/23/16 0150 04/23/16 0157 04/23/16 0853 04/24/16 0335  NA 135 136 145 141  K 4.2 4.2 4.2 2.9*  CL 95* 96* 106 107  CO2 19*  --  26 23  BUN 21* 25* 18 28*  CREATININE 1.39* 1.00 1.23* 1.28*  GLUCOSE 670* 630* 276* 187*   Electrolytes  Recent Labs Lab 04/23/16 0150 04/23/16 0853 04/24/16 0335  CALCIUM 9.4 8.9 8.9  MG  --  1.9 2.0  PHOS  --  2.0* 2.4*   CBC  Recent Labs Lab 04/23/16 0150 04/23/16 0157 04/23/16 0853 04/24/16 0335  WBC 10.7*  --  12.3* 14.5*  HGB 15.0 17.7* 14.2 12.6  HCT 46.3* 52.0* 44.6 37.8  PLT 260  --  244 223   Coag's  Recent Labs Lab 04/23/16 0150  APTT 26  INR  0.87   Sepsis Markers  Recent Labs Lab 04/23/16 0853 04/23/16 1341 04/23/16 1644  LATICACIDVEN 4.6* 1.8 1.0   ABG  Recent Labs Lab 04/23/16 0300 04/24/16 0722  PHART 7.407 7.475*  PCO2ART 45.7* 36.4  PO2ART 409.0* 179.0*   Liver Enzymes  Recent Labs Lab 04/23/16 0150  AST 42*  ALT 30  ALKPHOS 126  BILITOT 0.7  ALBUMIN 3.9   Cardiac Enzymes No results for input(s): TROPONINI, PROBNP in the last 168 hours.  Glucose  Recent Labs Lab 04/24/16 0337 04/24/16 0434 04/24/16 0538 04/24/16 0630  04/24/16 0727 04/24/16 0834  GLUCAP 185* 138* 149* 120* 153* 208*   Imaging Dg Chest Port 1 View  04/24/2016  CLINICAL DATA:  Respiratory difficulty EXAM: PORTABLE CHEST 1 VIEW COMPARISON:  04/23/2016 FINDINGS: Tubular devices are stable. Patchy bilateral apical pulmonary opacities have increased. Minimal patchy density at the retrocardiac left base. Upper normal heart size. No pneumothorax. Normal vascularity. IMPRESSION: Increased patchy pulmonary opacities at both lung apices and left base. Considerations include patchy volume loss or patchy airspace disease. Electronically Signed   By: Jolaine Click M.D.   On: 04/24/2016 07:34   Dg Chest Port 1 View  04/23/2016  CLINICAL DATA:  Central line placement. EXAM: PORTABLE CHEST 1 VIEW COMPARISON:  04/23/2016 FINDINGS: Endotracheal tube is 6 cm above the carina. Left central line tip in the SVC. No pneumothorax. NG tube tip in the stomach. Heart is normal size. No confluent airspace opacities or effusions. No acute bony abnormality. IMPRESSION: Support devices as above.  No pneumothorax.  No acute findings. Electronically Signed   By: Charlett Nose M.D.   On: 04/23/2016 13:00   STUDIES:  CT and CTA head as above CXR noted MRI with 9mm CVA  CULTURES: None  ANTIBIOTICS: None  SIGNIFICANT EVENTS:   LINES/TUBES: 4/24 ETT 4/24 OGT 4/24 Foley 4/24 L IJ TLC>>>  DISCUSSION: 60F w/ history of prior strokes presents with new onset status epilepticus. No prior history of seizures. There is concern for underlying stroke as a potential inciting event, but imaging has thus far been unrevealing. She requires ventilatory support as she is unable to protect her airway. She is also hyperglycemic with an anion gap of 21 and HCO3 19. Lactate and UA have yet to be obtained.   ASSESSMENT / PLAN:  PULMONARY A: Acute respiratory insufficiency / inability to protect airway 2/2 status epilepticus P:   Begin PS trials. Titrate O2 for sat of 88-92%. Wean  as tolerated. SBT when mental status is addressed.  CARDIOVASCULAR A:  Hypertension / labile BP A flutter - new Hx 1st degree AV block P:  DC levophed. Continue labetalol PO 100 mg q8 with holding parameters. Consider echo if BP remains labile.  RENAL A:   Metabolic acidosis - lactic vs ketoacids P:   KVO IVF. Trend lactate. Insulin GTT. Follow lytes  GASTROINTESTINAL A:   No acute issues P:   TF per nutrition.  HEMATOLOGIC A:   No acute issues P:  Monitor Start SQ heparin on 4/26, received lovenox today.  INFECTIOUS A:   No acute issues P:   Monitor  ENDOCRINE A:   Diabetes with marked hyperglycemia P:   Insulin gtt Diabetic educator following Accuchecks   NEUROLOGIC A:   Status epilepticus - underlying etiology unclear. Acute to subacute CVA on MRI noted. P:   D/C propofol since no further seizure. Continue Keppra, Vimpat per neurology. MRI noted. Correct metabolic disturbances (acidosis, hyperglycemia).  FAMILY  - Updates:  Spoke with daughter 4/24 over the phone, plan to meet with daughter 4/25 for full update.  - Inter-disciplinary family meet or Palliative Care meeting due by:  day 7  The patient is critically ill with multiple organ systems failure and requires high complexity decision making for assessment and support, frequent evaluation and titration of therapies, application of advanced monitoring technologies and extensive interpretation of multiple databases.   Critical Care Time devoted to patient care services described in this note is  35  Minutes. This time reflects time of care of this signee Dr Koren Bound. This critical care time does not reflect procedure time, or teaching time or supervisory time of PA/NP/Med student/Med Resident etc but could involve care discussion time.  Alyson Reedy, M.D. [facility] Pulmonary/Critical Care Medicine. Pager: [phone]. After hours pager: [phone].  04/24/2016, 9:34 AM

## 2016-04-24 NOTE — Progress Notes (Signed)
Subjective: More awake this am.   Exam: Filed Vitals:   04/24/16 0800 04/24/16 0812  BP: 174/94 174/94  Pulse: 99 103  Temp:    Resp: 22 14   Gen: In bed, NAD Resp: non-labored breathing, no acute distress Abd: soft, nt  Neuro: MS: opens eyes, fixates and tracks, follws commands to close eyes and look left and right, does not follow appendicular commands.  FA:OZHYQCN:PERRL Motor: moves left arm spontaneously, right arm withdraws to noxious stimuli.  Sensory:withdraws to nox stim bilaterally.   Pertinent Labs: Elevated glucose.   Impression: 75 yo F with new onset seizures in the setting of hyperglycemia and previous strokes. Did nto capture any events of concern on EEG, formal read pending.   I would continue vimpat and keppra for now.   Recommendations: 1) continue keppra, vimpat.  2) will follow.   Ritta SlotMcNeill Dajae Kizer, MD [facility] [phone]  If 7pm- 7am, please page neurology on call as listed in AMION.

## 2016-04-24 NOTE — Progress Notes (Signed)
Daughter arrived, updated fully.  They do not wish for trach/peg.  Once extubated then no reintubation.  Alyson ReedyWesam G. Yacoub, M.D. [facility] Pulmonary/Critical Care Medicine. Pager: [phone]. After hours pager: [phone].

## 2016-04-24 NOTE — Procedures (Signed)
Electroencephalogram report- LTM  Ordering Physician : Dr.  EEG number: [id number]    Beginning date or time: 04/23/2016 8:17AM  Ending date or time: 04/24/2016 0730AM  Day of study: day 1  Medications include: Per EMR  HISTORY: This 24 hours of intensive EEG monitoring with simultaneous video monitoring was performed for this patient with altered mental status and seizure like spells. This EEG was requested to rule out subclinical electrographic seizures.  TECHNICAL DESCRIPTION:  The study consists of a continuous 16-channel multi-montage digital video EEG recording with twenty-one electrodes placed according to the International 10-20 System. Additional leads included eye leads, true temporal leads (T1, T2), and an EKG lead. Activation procedures were not done due to mental status.  REPORT: The background activity during quite periods consisted of polymorphic delta with overriding theta background. The activity seemed to be somewhat reactive to tactile stimuli, with the appearance generalized rhythmic delta activity. Overriding faster frequencies were seen were more prominent in the right hemispheric region. No epileptiform activity was identified.    There were no push button events during the recording.  IMPRESSION: This is an abnormal EEG due to: 1) Possible focal slowing on the left with loss of overriding faster frequencies 2) Diffuse slowing.  CLINICAL CORRELATION: This EEG is consistent with diffuse non-specific cerebral dysfunction, with suggestion of focal neuronal dysfunction on the left. No electrographic seizures were seen.

## 2016-04-24 NOTE — Progress Notes (Signed)
eLink Physician-Brief Progress Note Patient Name: Dana GlossBarbara J Blair DOB: 1941/05/16 MRN: [id number]   Date of Service  04/24/2016  HPI/Events of Note  Hypertension - BP = 202/86.  eICU Interventions  Will add Hydralazine 10 mg IV Q 4 hours PRN SBP > 170 or DBP > 100.     Intervention Category Major Interventions: Hypertension - evaluation and management  Sommer,Steven Eugene 04/24/2016, 11:14 PM

## 2016-04-24 NOTE — Progress Notes (Signed)
SLP Cancellation Note  Patient Details Name: Dana Blair MRN: [id number] DOB: Jan 09, 1941   Cancelled treatment:       Reason Eval/Treat Not Completed: Patient not medically ready, currently intubated. Will follow for readiness.   Dana Blair, Dana Blair [phone]  Dana Blair, Dana Blair 04/24/2016, 11:47 AM

## 2016-04-24 NOTE — Clinical Documentation Improvement (Signed)
Critical Care  Can the diagnosis of CKD be further specified?   CKD Stage I - GFR greater than or equal to 90  CKD Stage II - GFR 60-89  CKD Stage III - GFR 30-59  CKD Stage IV - GFR 15-29  CKD Stage V - GFR < 15  ESRD (End Stage Renal Disease)  Other condition  Unable to clinically determine   Supporting Information: : (risk factors, signs and symptoms, diagnostics, treatment) Diabetes mellitus with chronic kidney disease per 4/24 ED provider notes. Labs:   Bun   Creat   GFR: 4/25:     28       1.28    47. 4/24:     18       1.23    49.  Please exercise your independent, professional judgment when responding. A specific answer is not anticipated or expected.   Thank Dana DonovanYou, Allen Egerton [facility] [employer] [facility] [place] [phone]

## 2016-04-24 NOTE — Progress Notes (Signed)
LTM taken down, no skin breakdown noted.

## 2016-04-24 NOTE — Progress Notes (Signed)
PT Cancellation Note  Patient Details Name: Dana Blair MRN: [id number] DOB: [birth year]   Cancelled Treatment:    Reason Eval/Treat Not Completed: Patient not medically ready (Pt intubated and sedated).   [name] 04/24/2016, 11:02 AM [name] PT [phone]

## 2016-04-25 ENCOUNTER — Inpatient Hospital Stay (HOSPITAL_COMMUNITY): Payer: Medicare Other

## 2016-04-25 DIAGNOSIS — J9600 Acute respiratory failure, unspecified whether with hypoxia or hypercapnia: Secondary | ICD-10-CM

## 2016-04-25 LAB — CBC
HCT: 36.5 % (ref 36.0–46.0)
Hemoglobin: 11.8 g/dL — ABNORMAL LOW (ref 12.0–15.0)
MCH: 26.2 pg (ref 26.0–34.0)
MCHC: 32.3 g/dL (ref 30.0–36.0)
MCV: 80.9 fL (ref 78.0–100.0)
PLATELETS: 193 10*3/uL (ref 150–400)
RBC: 4.51 MIL/uL (ref 3.87–5.11)
RDW: 14 % (ref 11.5–15.5)
WBC: 11.8 10*3/uL — AB (ref 4.0–10.5)

## 2016-04-25 LAB — BASIC METABOLIC PANEL
ANION GAP: 10 (ref 5–15)
BUN: 18 mg/dL (ref 6–20)
CO2: 22 mmol/L (ref 22–32)
Calcium: 8.6 mg/dL — ABNORMAL LOW (ref 8.9–10.3)
Chloride: 107 mmol/L (ref 101–111)
Creatinine, Ser: 0.84 mg/dL (ref 0.44–1.00)
GFR calc Af Amer: >60 mL/min (ref >60)
GLUCOSE: 244 mg/dL — AB (ref 65–99)
POTASSIUM: 3.2 mmol/L — AB (ref 3.5–5.1)
Sodium: 139 mmol/L (ref 135–145)

## 2016-04-25 LAB — LIPID PANEL
CHOLESTEROL: 194 mg/dL (ref 0–200)
HDL: 67 mg/dL (ref >40)
LDL CALC: 110 mg/dL — AB (ref 0–99)
TRIGLYCERIDES: 87 mg/dL (ref <150)
Total CHOL/HDL Ratio: 2.9 RATIO
VLDL: 17 mg/dL (ref 0–40)

## 2016-04-25 LAB — BLOOD GAS, ARTERIAL
Acid-Base Excess: 1.4 mmol/L (ref 0.0–2.0)
Bicarbonate: 24.4 mEq/L — ABNORMAL HIGH (ref 20.0–24.0)
Drawn by: 449561
FIO2: 0.4
LHR: 14 {breaths}/min
MECHVT: 440 mL
O2 Saturation: 99.4 %
PATIENT TEMPERATURE: 98.6
PCO2 ART: 31.7 mmHg — AB (ref 35.0–45.0)
PEEP: 5 cmH2O
PO2 ART: 180 mmHg — AB (ref 80.0–100.0)
TCO2: 25.4 mmol/L (ref 0–100)
pH, Arterial: 7.498 — ABNORMAL HIGH (ref 7.350–7.450)

## 2016-04-25 LAB — GLUCOSE, CAPILLARY
GLUCOSE-CAPILLARY: 218 mg/dL — AB (ref 65–99)
GLUCOSE-CAPILLARY: 134 mg/dL — AB (ref 65–99)
GLUCOSE-CAPILLARY: 302 mg/dL — AB (ref 65–99)
Glucose-Capillary: 180 mg/dL — ABNORMAL HIGH (ref 65–99)
Glucose-Capillary: 270 mg/dL — ABNORMAL HIGH (ref 65–99)
Glucose-Capillary: 157 mg/dL — ABNORMAL HIGH (ref 65–99)

## 2016-04-25 LAB — URINE CULTURE: Culture: >=100000 — AB

## 2016-04-25 LAB — HEMOGLOBIN A1C
Hgb A1c MFr Bld: 9.6 % — ABNORMAL HIGH (ref 4.8–5.6)
Mean Plasma Glucose: 229 mg/dL

## 2016-04-25 LAB — PHOSPHORUS: Phosphorus: 2.2 mg/dL — ABNORMAL LOW (ref 2.5–4.6)

## 2016-04-25 LAB — MAGNESIUM: Magnesium: 1.8 mg/dL (ref 1.7–2.4)

## 2016-04-25 MED ORDER — SODIUM PHOSPHATES 45 MMOLE/15ML IV SOLN
10.0000 mmol | Freq: Once | INTRAVENOUS | Status: AC
  Administered 2016-04-25: 10 mmol via INTRAVENOUS
  Filled 2016-04-25: qty 3.33

## 2016-04-25 MED ORDER — INSULIN ASPART 100 UNIT/ML ~~LOC~~ SOLN
4.0000 [IU] | SUBCUTANEOUS | Status: DC
  Administered 2016-04-25 – 2016-04-26 (×4): 4 [IU] via SUBCUTANEOUS

## 2016-04-25 MED ORDER — DEXTROSE 5 % IV SOLN
1.0000 g | INTRAVENOUS | Status: DC
  Administered 2016-04-25 – 2016-04-28 (×4): 1 g via INTRAVENOUS
  Filled 2016-04-25 (×4): qty 10

## 2016-04-25 MED ORDER — SODIUM CHLORIDE 0.9 % IV SOLN
100.0000 mg | Freq: Two times a day (BID) | INTRAVENOUS | Status: DC
  Administered 2016-04-25 – 2016-04-26 (×4): 100 mg via INTRAVENOUS
  Filled 2016-04-25 (×9): qty 10

## 2016-04-25 MED ORDER — HYDRALAZINE HCL 20 MG/ML IJ SOLN
10.0000 mg | INTRAMUSCULAR | Status: DC | PRN
  Administered 2016-04-25 – 2016-04-27 (×3): 20 mg via INTRAVENOUS
  Filled 2016-04-25 (×3): qty 1

## 2016-04-25 MED ORDER — MAGNESIUM SULFATE 2 GM/50ML IV SOLN
2.0000 g | Freq: Once | INTRAVENOUS | Status: AC
  Administered 2016-04-25: 2 g via INTRAVENOUS
  Filled 2016-04-25: qty 50

## 2016-04-25 MED ORDER — POTASSIUM CHLORIDE 20 MEQ/15ML (10%) PO SOLN
30.0000 meq | ORAL | Status: AC
  Administered 2016-04-25 (×2): 30 meq
  Filled 2016-04-25 (×2): qty 30

## 2016-04-25 MED ORDER — FENTANYL CITRATE (PF) 100 MCG/2ML IJ SOLN
25.0000 ug | INTRAMUSCULAR | Status: DC | PRN
  Administered 2016-04-25: 25 ug via INTRAVENOUS
  Filled 2016-04-25 (×2): qty 2

## 2016-04-25 MED ORDER — ENOXAPARIN SODIUM 30 MG/0.3ML ~~LOC~~ SOLN
30.0000 mg | SUBCUTANEOUS | Status: DC
  Administered 2016-04-25 – 2016-05-01 (×7): 30 mg via SUBCUTANEOUS
  Filled 2016-04-25 (×7): qty 0.3

## 2016-04-25 MED ORDER — ATORVASTATIN CALCIUM 20 MG PO TABS
20.0000 mg | ORAL_TABLET | Freq: Every day | ORAL | Status: DC
  Administered 2016-04-25 – 2016-04-30 (×5): 20 mg via ORAL
  Filled 2016-04-25 (×5): qty 1

## 2016-04-25 MED ORDER — INSULIN GLARGINE 100 UNIT/ML ~~LOC~~ SOLN
24.0000 [IU] | SUBCUTANEOUS | Status: DC
  Administered 2016-04-25: 24 [IU] via SUBCUTANEOUS
  Filled 2016-04-25 (×2): qty 0.24

## 2016-04-25 NOTE — Progress Notes (Addendum)
Subjective: More awake this am.   Exam: Filed Vitals:   04/25/16 0800 04/25/16 0900  BP: 177/80   Pulse: 78   Temp:  99.9 F (37.7 C)  Resp: 14    Gen: In bed, NAD Resp: non-labored breathing, no acute distress Abd: soft, nt  Neuro: MS: opens eyes, fixates and tracks, follws commands to close eyes and look left and right, does not follow appendicular commands.  WU:JWJXBCN:PERRL Motor: moves left arm spontaneously, right arm withdraws to noxious stimuli.  Sensory:withdraws to nox stim bilaterally.   Pertinent Labs: Elevated glucose.   Impression: 75 yo F with new onset seizures in the setting of hyperglycemia and previous strokes. She does have small subcortical infarct which I feel is incidental. It does not appear embolic, but I Would favor small vessel disease management.   I would continue vimpat and keppra for now, but she is sedated and will reduce vimpat dose.   Recommendations: 1) continue  keppra 1g BID 2) Reduce vimpat to 100mg  BID 3) LDL 110, will start statin 4) a1c pending, goal < 7.  5) PT,OT once extubated.   Ritta SlotMcNeill Kirkpatrick, MD [facility] [phone]  If 7pm- 7am, please page neurology on call as listed in AMION.

## 2016-04-25 NOTE — Progress Notes (Signed)
PT Cancellation Note  Patient Details Name: Dana Blair MRN: [id number] DOB: 08/16/1941   Cancelled Treatment:    Reason Eval/Treat Not Completed: Patient not medically ready   Edwin Baines 04/25/2016, 8:24 AM Skip Mayerary Hemi Chacko PT [phone]

## 2016-04-25 NOTE — Progress Notes (Signed)
SLP Cancellation Note  Patient Details Name: Scharlene GlossBarbara J Lafalce MRN: [id number] DOB: 07-Nov-1941   Cancelled treatment:       Reason Eval/Treat Not Completed: Patient not medically ready  Maxcine HamLaura Paiewonsky, M.A. CCC-SLP [phone]  Maxcine Hamaiewonsky, Jenavive Lamboy 04/25/2016, 11:12 AM

## 2016-04-25 NOTE — Progress Notes (Signed)
04/25/2016 0830 Dr. Vassie LollAlva on floor and made aware of pt. With need for Bilateral Soft Wrist restraints this morning vs. Prior Left Soft wrist restraint due to increased mobility of RUE. Verbal orders received ok to continue bilateral soft wrist restraints for patient safety and airway protection. Orders placed. Will continue to closely monitor patient.  Taten Merrow, Blanchard KelchStephanie Ingold

## 2016-04-25 NOTE — Progress Notes (Signed)
PULMONARY / CRITICAL CARE MEDICINE   Name: Dana Blair MRN: [id number] DOB: January 10, 1941    ADMISSION DATE:  04/23/2016 CONSULTATION DATE:  04/23/16  REFERRING MD:  EDP  CHIEF COMPLAINT:  seizures  HISTORY OF PRESENT ILLNESS:   Dana Blair is a 29F with history of prior ischemic and hemorrhagic strokes who presents to the ED via EMS after her family found her seizing with R gaze deviation. Last known normal was around 10pm on 4/23. She continued to seize with mostly right-sided involvement, but occasional left-sided symptoms as well. She received  versed en route. She was desaturating and required intubation. CT head was negative for blood.  On arrival to the ED she was also in atrial fibrillation, which reportedly is new for her. She was placed on propofol for seizure suppression, loaded with keppra, given additional benzos, and vimpat was also added.  SUBJECTIVE:  No events overnight, drowsy but arousable Afebrile Hypertensive    VITAL SIGNS: BP 177/80 mmHg  Pulse 78  Temp(Src) 99.2 F (37.3 C) (Oral)  Resp 14  Ht  (1.626 m)  Wt 120 lb 13 oz (54.8 kg)  BMI 20.73 kg/m2  SpO2 100%  HEMODYNAMICS: CVP:  [2 mmHg-64 mmHg] 3 mmHg  VENTILATOR SETTINGS: Vent Mode:  [-] PRVC FiO2 (%):  [40 %] 40 % Set Rate:  [14 bmp] 14 bmp Vt Set:  [440 mL] 440 mL PEEP:  [5 cmH20] 5 cmH20 Pressure Support:  [8 cmH20] 8 cmH20 Plateau Pressure:  [13 cmH20-15 cmH20] 13 cmH20  INTAKE / OUTPUT: I/O last 3 completed shifts: In: 2811.7 [I.V.:896.7; Other:30; NG/GT:1410; IV Piggyback:475] Out: 1550 [Urine:1550]  PHYSICAL EXAMINATION:  General Well developed, no apparent distress  HEENT No gross abnormalities. ETT/OGT in place, [place]/AT, PERRL, EOM spontaneous.  Pulmonary Clear to auscultation bilaterally with no wheezes, rales or ronchi. No respiratory effort on PS.   Cardiovascular Tachycardic, regular rhythm. S1, s2. No m/r/g. Distal pulses palpable.  Abdomen Soft, non-tender,  non-distended, positive bowel sounds, no palpable organomegaly or masses. Normoresonant to percussion.  Musculoskeletal No bony abnormalities  Lymphatics No cervical, supraclavicular or axillary adenopathy.   Neurologic Pupils reactive. Moving left side spontaneously, less movement on the right.  Skin/Integuement Diffuse actinic keratoses, some areas on the lower abdomen that are more scaly appearing. No cyanosis, no clubbing.     LABS:  BMET  Recent Labs Lab 04/23/16 0853 04/24/16 0335 04/25/16 0424  NA 145 141 139  K 4.2 2.9* 3.2*  CL 106 107 107  CO2 BUN 18 28* 18  CREATININE 1.23* 1.28* 0.84  GLUCOSE 276* 187* 244*   Electrolytes  Recent Labs Lab 04/23/16 0853 04/24/16 0335 04/25/16 0424  CALCIUM 8.9 8.9 8.6*  MG 1.9 2.0 1.8  PHOS 2.0* 2.4* 2.2*   CBC  Recent Labs Lab 04/23/16 0853 04/24/16 0335 04/25/16 0424  WBC 12.3* 14.5* 11.8*  HGB 14.2 12.6 11.8*  HCT 44.6 37.8 36.5  PLT 244 223 193   Coag's  Recent Labs Lab 04/23/16 0150  APTT 26  INR 0.87   Sepsis Markers  Recent Labs Lab 04/23/16 0853 04/23/16 1341 04/23/16 1644  LATICACIDVEN 4.6* 1.8 1.0   ABG  Recent Labs Lab 04/23/16 0300 04/24/16 0722 04/25/16 0352  PHART 7.407 7.475* 7.498*  PCO2ART 45.7* 36.4 31.7*  PO2ART 409.0* 179.0* 180*   Liver Enzymes  Recent Labs Lab 04/23/16 0150  AST 42*  ALT 30  ALKPHOS 126  BILITOT 0.7  ALBUMIN 3.9   Cardiac  Enzymes No results for input(s): TROPONINI, PROBNP in the last 168 hours.  Glucose  Recent Labs Lab 04/24/16 1533 04/24/16 1638 04/24/16 1731 04/24/16 1935 04/25/16 0002 04/25/16 0432  GLUCAP 96 158* 148* 189* 180* 218*   Imaging Dg Chest Port 1 View  04/25/2016  CLINICAL DATA:  Ventilator dependent respiratory failure. EXAM: PORTABLE CHEST 1 VIEW COMPARISON:  04/24/2016 and 04/23/2016 FINDINGS: Endotracheal tube, NG tube and central venous catheter appear essentially unchanged in position. The lungs are  clear. Pulmonary vascularity is normal. Heart size is normal. Extensive calcification in the aortic arch. No acute bone abnormality. IMPRESSION: No acute abnormalities.  Aortic atherosclerosis. Electronically Signed   By: Francene BoyersJames  Maxwell M.D.   On: 04/25/2016 07:43   STUDIES:  CT and CTA head >>neg, Stable moderate to severe right and more mild proximal left ACA stenoses. MRI 4/24 with 9mm rt acute/sub acute CVA, Chronic hemorrhagic right basal ganglia infarct, with additional chronic lacunar infarcts within the thalami  EEG 4/25 >>Possible focal slowing on the left with loss of overriding faster frequencies, Diffuse slowing.  CULTURES: None  ANTIBIOTICS: None  SIGNIFICANT EVENTS:   LINES/TUBES: 4/24 ETT 4/24 OGT 4/24 Foley 4/24 L IJ TLC>>>  DISCUSSION: 30F w/ history of prior strokes presents with new onset status epilepticus. No prior history of seizures. There is concern for underlying stroke as a potential inciting event, but imaging has thus far been unrevealing. She requires ventilatory support as she is unable to protect her airway. She was also hyperglycemic with an anion gap of 21 and HCO3 19.   ASSESSMENT / PLAN:  PULMONARY A: Acute respiratory insufficiency / inability to protect airway 2/2 status epilepticus P:   Begin PS trials with goal extubation No reintubation clarified by daughter  CARDIOVASCULAR A:  Hypertension / labile BP A flutter - new Hx 1st degree AV block Echo 07/2015 >> nml LV fn P:  Increase labetalol PO 200 mg q8 with holding parameters. hydrallazine prn  RENAL A:  AKI  Metabolic acidosis - lactic vs ketoacids-resolved hypokalemia P:   Replete  Lytes as needed  GASTROINTESTINAL A:   No acute issues P:   TF per nutrition.  HEMATOLOGIC A:   No acute issues P:  Monitor SQ lovenox   INFECTIOUS A:   No acute issues P:   Monitor  ENDOCRINE A:   Diabetes with marked hyperglycemia P:   Off Insulin gtt since 5p on 4/25 On  lantus 20 & SSI Diabetic educator following Accuchecks   NEUROLOGIC A:   Status epilepticus  Acute to subacute CVA on MRI noted. P:   Off propofol since no further seizure. Continue Keppra, Vimpat per neurology.   FAMILY  - Updates:  daughter 4/25   - Inter-disciplinary family meet or Palliative Care meeting due by:  day 7  SUmmary - hope to extubate when fully awake  The patient is critically ill with multiple organ systems failure and requires high complexity decision making for assessment and support, frequent evaluation and titration of therapies, application of advanced monitoring technologies and extensive interpretation of multiple databases. Critical Care Time devoted to patient care services described in this note independent of APP time is 35 minutes.   Cyril Mourningakesh Georgena Weisheit MD. Tonny BollmanFCCP. [place] Pulmonary & Critical care Pager [phone] If no response call [phone]   04/25/2016    04/25/2016, 8:51 AM

## 2016-04-25 NOTE — Progress Notes (Signed)
04/25/2016 4:07 PM Elink MD notified of increasing CBG's this afternoon as well as DM coordinator recommendations. Verbal order Dr. Molli KnockYacoub ok to modify existing orders to meet DM Coordinator recommendations. Orders placed for Lantus 24 units SQ daily and Increase Q4H SQ Novolog Insulin Coverage to 4 units. Will continue to closely monitor patient.  Bexleigh Theriault, Blanchard KelchStephanie Ingold

## 2016-04-25 NOTE — Progress Notes (Signed)
[facility]OC[facility] FOR AM LAB REPLACEMENT ONLY  The patient does apply for the [facility] Electrolyte Re[facility]ment Protocol based on the criteria listed below:   1. Is GFR >/= 40 ml/min? Yes.    Patient's GFR today is >60 2. Is urine output >/= 0.5 ml/kg/hr for the last 6 hours? Yes.   Patient's UOP is 1.8 ml/kg/hr 3. Is BUN < 60 mg/dL? Yes.    Patient's BUN today is 18 4. Abnormal electrolyte  K 3.2 5. Ordered repletion with: per protocol 6. If a panic level lab has been reported, has the CCM MD in charge been notified? Yes.  .   Physician:  Tonny BranchSommer  Shawan Tosh, Lang Snowlizabeth McEachran 04/25/2016 5:50 AM

## 2016-04-25 NOTE — Progress Notes (Signed)
OT Cancellation Note  Patient Details Name: Dana Blair MRN: [id number] DOB: [birth year]   Cancelled Treatment:    Reason Eval/Treat Not Completed: Patient not medically ready  Earlie RavelingStraub, Korey Arroyo L OTR/L [phone] 04/25/2016, 9:22 AM

## 2016-04-25 NOTE — Progress Notes (Signed)
Inpatient Diabetes Program Recommendations  AACE/ADA: New Consensus Statement on Inpatient Glycemic Control (2015)  Target Ranges:  Prepandial:   less than 140 mg/dL      Peak postprandial:   less than 180 mg/dL (1-2 hours)      Critically ill patients:  140 - 180 mg/dL   Review of Glycemic Control:  Results for Dana Blair, Dana Blair (MRN [id number]) as of 04/25/2016 10:16  Ref. Range 04/24/2016 19:35 04/25/2016 00:02 04/25/2016 04:32 04/25/2016 09:20  Glucose-Capillary Latest Ref Range: 65-99 mg/dL 409189 (H) 811180 (H) 914218 (H) 134 (H)   Note that patient transitioned off insulin drip yesterday. Inpatient Diabetes Program Recommendations:    Consider slight titration up of Novolog tube feed coverage to 4 units q 4 hours and increase Lantus to 24 units daily.  Thanks, Beryl MeagerJenny Pratt Bress, RN, BC-ADM Inpatient Diabetes Coordinator Pager [phone] (8a-5p)

## 2016-04-26 LAB — GLUCOSE, CAPILLARY
GLUCOSE-CAPILLARY: 120 mg/dL — AB (ref 65–99)
GLUCOSE-CAPILLARY: 138 mg/dL — AB (ref 65–99)
GLUCOSE-CAPILLARY: 159 mg/dL — AB (ref 65–99)
GLUCOSE-CAPILLARY: 177 mg/dL — AB (ref 65–99)
Glucose-Capillary: 142 mg/dL — ABNORMAL HIGH (ref 65–99)
Glucose-Capillary: 107 mg/dL — ABNORMAL HIGH (ref 65–99)

## 2016-04-26 LAB — BASIC METABOLIC PANEL
Anion gap: 8 (ref 5–15)
BUN: 26 mg/dL — AB (ref 6–20)
CALCIUM: 8.5 mg/dL — AB (ref 8.9–10.3)
CHLORIDE: 110 mmol/L (ref 101–111)
CO2: 23 mmol/L (ref 22–32)
CREATININE: 0.84 mg/dL (ref 0.44–1.00)
GFR calc Af Amer: >60 mL/min (ref >60)
Glucose, Bld: 172 mg/dL — ABNORMAL HIGH (ref 65–99)
POTASSIUM: 3.6 mmol/L (ref 3.5–5.1)
SODIUM: 141 mmol/L (ref 135–145)

## 2016-04-26 LAB — CBC
HCT: 35.1 % — ABNORMAL LOW (ref 36.0–46.0)
Hemoglobin: 11.4 g/dL — ABNORMAL LOW (ref 12.0–15.0)
MCH: 26.4 pg (ref 26.0–34.0)
MCHC: 32.5 g/dL (ref 30.0–36.0)
MCV: 81.3 fL (ref 78.0–100.0)
PLATELETS: 208 10*3/uL (ref 150–400)
RBC: 4.32 MIL/uL (ref 3.87–5.11)
RDW: 14.4 % (ref 11.5–15.5)
WBC: 10.2 10*3/uL (ref 4.0–10.5)

## 2016-04-26 LAB — MAGNESIUM: Magnesium: 2.4 mg/dL (ref 1.7–2.4)

## 2016-04-26 LAB — PHOSPHORUS: PHOSPHORUS: 3.9 mg/dL (ref 2.5–4.6)

## 2016-04-26 MED ORDER — ASPIRIN 81 MG PO CHEW
324.0000 mg | CHEWABLE_TABLET | Freq: Every day | ORAL | Status: DC
  Administered 2016-04-26 – 2016-05-01 (×5): 324 mg via ORAL
  Filled 2016-04-26 (×5): qty 4

## 2016-04-26 NOTE — Progress Notes (Signed)
OT Cancellation Note  Patient Details Name: Dana Blair MRN: [id number] DOB: [birth year]   Cancelled Treatment:    Reason Eval/Treat Not Completed:  Pt to be extubated this morning per MD. Will follow.  Evern BioMayberry, [name] 04/26/2016, 8:37 AM

## 2016-04-26 NOTE — Evaluation (Signed)
Physical Therapy Evaluation Patient Details Name: Dana Blair MRN: [id number] DOB: [birth year] Today's Date: 04/26/2016   History of Present Illness  Pt admitted with seizure in the setting of hyperglycemia. New onset afib. Required intubation 4/24-4/27. PMH: DM, CVA, HTN, CKD, syncope and glaucoma.  Clinical Impression  Pt recently extubated with RN planning for dangle at EOB, so proceeded with therapy evals.  Pt's VSS throughout mobility, however pt with trouble managing oral secretions and often needed cueing for use of suction or wash cloth.  Pt with minimal verbalizations and very soft voice when she did speak.  Pt able to tolerate sitting EOB ~87mins on 2L O2.  Feel pt will need SNF level of care at D/C.      Follow Up Recommendations SNF    Equipment Recommendations  None recommended by PT    Recommendations for Other Services       Precautions / Restrictions Precautions Precautions: Fall Restrictions Weight Bearing Restrictions: No      Mobility  Bed Mobility Overal bed mobility: Needs Assistance;+2 for physical assistance Bed Mobility: Supine to Sit;Sit to Supine     Supine to sit: +2 for physical assistance;Mod assist Sit to supine: +2 for physical assistance;Mod assist   General bed mobility comments: verbal and tactile cues for sequence, assist for LEs and trunk  Transfers Overall transfer level:  (deferred, activity limited to EOB post extubation)                  Ambulation/Gait                Stairs            Wheelchair Mobility    Modified Rankin (Stroke Patients Only)       Balance Overall balance assessment: Needs assistance Sitting-balance support: Feet supported;Bilateral upper extremity supported Sitting balance-Leahy Scale: Poor Sitting balance - Comments: requiring min to min guard assist                                     Pertinent Vitals/Pain Pain Assessment: No/denies pain    Home Living  Family/patient expects to be discharged to:: Private residence Living Arrangements: Children (son) Available Help at Discharge: Family;Available PRN/intermittently Type of Home: House       Home Layout: One level Home Equipment: None Additional Comments: pt appears to be a poor historian    Prior Function Level of Independence: Independent   Gait / Transfers Assistance Needed: ambulates without a device           Hand Dominance        Extremity/Trunk Assessment   Upper Extremity Assessment: Defer to OT evaluation           Lower Extremity Assessment: Generalized weakness;Difficult to assess due to impaired cognition      Cervical / Trunk Assessment: Kyphotic  Communication   Communication: Expressive difficulties (pt with minimal verbalization, decreased management of saliv)  Cognition Arousal/Alertness: Awake/alert Behavior During Therapy: Flat affect Overall Cognitive Status: Impaired/Different from baseline Area of Impairment: Orientation;Attention;Memory;Following commands;Safety/judgement;Problem solving Orientation Level: Disoriented to;Time;Situation Current Attention Level: Sustained Memory: Decreased short-term memory Following Commands: Follows one step commands inconsistently;Follows one step commands with increased time (with multimodal cues at times) Safety/Judgement: Decreased awareness of safety;Decreased awareness of deficits   Problem Solving: Slow processing;Decreased initiation;Requires verbal cues;Difficulty sequencing;Requires tactile cues      General Comments      Exercises  Assessment/Plan    PT Assessment Patient needs continued PT services  PT Diagnosis Difficulty walking;Generalized weakness   PT Problem List Decreased strength;Decreased activity tolerance;Decreased balance;Decreased mobility;Decreased coordination;Decreased cognition;Decreased knowledge of use of DME;Decreased safety awareness;Cardiopulmonary status  limiting activity  PT Treatment Interventions DME instruction;Gait training;Functional mobility training;Therapeutic activities;Therapeutic exercise;Balance training;Cognitive remediation;Patient/family education   PT Goals (Current goals can be found in the Care Plan section) Acute Rehab PT Goals Patient Stated Goal: wanted to lay back down PT Goal Formulation: Patient unable to participate in goal setting Time For Goal Achievement: 05/10/16 Potential to Achieve Goals: Good    Frequency Min 2X/week   Barriers to discharge        Co-evaluation PT/OT/SLP Co-Evaluation/Treatment: Yes Reason for Co-Treatment: Complexity of the patient's impairments (multi-system involvement);For patient/therapist safety PT goals addressed during session: Mobility/safety with mobility;Strengthening/ROM;Balance OT goals addressed during session: Strengthening/ROM       End of Session Equipment Utilized During Treatment: Oxygen Activity Tolerance: Patient limited by fatigue Patient left: in bed;with call bell/phone within reach (Bed in chair position) Nurse Communication: Mobility status;Need for lift equipment         Time: 1140-1159 PT Time Calculation (min) (ACUTE ONLY): 19 min   Charges:   PT Evaluation $PT Eval Moderate Complexity: 1 Procedure     PT G CodesSunny Schlein:        Heavan Francom F, [place] CarolinaPT [phone] 04/26/2016, 2:38 PM

## 2016-04-26 NOTE — Progress Notes (Signed)
PULMONARY / CRITICAL CARE MEDICINE   Name: Dana Blair MRN: [id number] DOB: [birth year]    ADMISSION DATE:  04/23/2016 CONSULTATION DATE:  04/23/16  REFERRING MD:  EDP  CHIEF COMPLAINT:  seizures  HISTORY OF PRESENT ILLNESS:   Dana Blair is a 1F with history of prior ischemic and hemorrhagic strokes who presents to the ED via EMS after her family found her seizing with R gaze deviation. Last known normal was around 10pm on 4/23. She continued to seize with mostly right-sided involvement, but occasional left-sided symptoms as well. She received  versed en route. She was desaturating and required intubation. CT head was negative for blood.  On arrival to the ED she was also in atrial fibrillation, which reportedly is new for her. She was placed on propofol for seizure suppression, loaded with keppra, given additional benzos, and vimpat was also added.  SUBJECTIVE:  Low gr fever, drowsy but easily arousable Bp better No obvious pain   VITAL SIGNS: BP 138/64 mmHg  Pulse 94  Temp(Src) 100.6 F (38.1 C) (Axillary)  Resp 17  Ht  (1.626 m)  Wt 117 lb 1 oz (53.1 kg)  BMI 20.08 kg/m2  SpO2 100%  HEMODYNAMICS: CVP:  [2 mmHg-19 mmHg] 3 mmHg  VENTILATOR SETTINGS: Vent Mode:  [-] PRVC FiO2 (%):  [40 %] 40 % Set Rate:  [14 bmp] 14 bmp Vt Set:  [440 mL] 440 mL PEEP:  [5 cmH20] 5 cmH20 Plateau Pressure:  [14 cmH20-21 cmH20] 21 cmH20  INTAKE / OUTPUT: I/O last 3 completed shifts: In: 2752 [I.V.:550; NG/GT:1435; IV Piggyback:767] Out: 1610 [Urine:1610]  PHYSICAL EXAMINATION:  General Well developed, no apparent distress  HEENT No gross abnormalities. ETT/OGT in place, [place]/AT, PERRL, EOM spontaneous.  Pulmonary Clear to auscultation bilaterally with no wheezes, rales or ronchi. No respiratory effort on PS.   Cardiovascular Tachycardic, regular rhythm. S1, s2. No m/r/g. Distal pulses palpable.  Abdomen Soft, non-tender, non-distended, positive bowel sounds, no palpable  organomegaly or masses. Normoresonant to percussion.  Musculoskeletal No bony abnormalities  Lymphatics No cervical, supraclavicular or axillary adenopathy.   Neurologic Pupils reactive. Moving left side spontaneously, less movement on the right.  Skin/Integuement Diffuse actinic keratoses, some areas on the lower abdomen that are more scaly appearing. No cyanosis, no clubbing.     LABS:  BMET  Recent Labs Lab 04/24/16 0335 04/25/16 0424 04/26/16 0433  NA 141 139 141  K 2.9* 3.2* 3.6  CL 107 107 110  CO2 BUN 28* 18 26*  CREATININE 1.28* 0.84 0.84  GLUCOSE 187* 244* 172*   Electrolytes  Recent Labs Lab 04/24/16 0335 04/25/16 0424 04/26/16 0433  CALCIUM 8.9 8.6* 8.5*  MG 2.0 1.8 2.4  PHOS 2.4* 2.2* 3.9   CBC  Recent Labs Lab 04/24/16 0335 04/25/16 0424 04/26/16 0433  WBC 14.5* 11.8* 10.2  HGB 12.6 11.8* 11.4*  HCT 37.8 36.5 35.1*  PLT 223 193 208   Coag's  Recent Labs Lab 04/23/16 0150  APTT 26  INR 0.87   Sepsis Markers  Recent Labs Lab 04/23/16 0853 04/23/16 1341 04/23/16 1644  LATICACIDVEN 4.6* 1.8 1.0   ABG  Recent Labs Lab 04/23/16 0300 04/24/16 0722 04/25/16 0352  PHART 7.407 7.475* 7.498*  PCO2ART 45.7* 36.4 31.7*  PO2ART 409.0* 179.0* 180*   Liver Enzymes  Recent Labs Lab 04/23/16 0150  AST 42*  ALT 30  ALKPHOS 126  BILITOT 0.7  ALBUMIN 3.9   Cardiac Enzymes No results for input(s):  TROPONINI, PROBNP in the last 168 hours.  Glucose  Recent Labs Lab 04/25/16 0920 04/25/16 1249 04/25/16 1544 04/25/16 1916 04/26/16 0004 04/26/16 0404  GLUCAP 134* 270* 302* 157* 159* 142*   Imaging No results found. STUDIES:  CT and CTA head >>neg, Stable moderate to severe right and more mild proximal left ACA stenoses. MRI 4/24 with 9mm rt acute/sub acute CVA, Chronic hemorrhagic right basal ganglia infarct, with additional chronic lacunar infarcts within the thalami  EEG 4/25 >>Possible focal slowing on the left  with loss of overriding faster frequencies, Diffuse slowing.  CULTURES: None  ANTIBIOTICS: None  SIGNIFICANT EVENTS:   LINES/TUBES: 4/24 ETT >> 4/27 4/24 OGT 4/24 Foley 4/24 L IJ TLC>>>  DISCUSSION: 57F w/ history of prior strokes presents with new onset status epilepticus. No prior history of seizures. There is concern for underlying stroke as a potential inciting event, but imaging has thus far been unrevealing. She requires ventilatory support as she is unable to protect her airway. She was also hyperglycemic with an anion gap of 21 and HCO3 19.   ASSESSMENT / PLAN:  PULMONARY A: Acute respiratory insufficiency / inability to protect airway 2/2 status epilepticus P:   Proceed with extubation - secretions main issue OK for reintubation clarified by daughter - no trach Pulm hygiene  CARDIOVASCULAR A:  Hypertension / labile BP A flutter - new , back in nSR Hx 1st degree AV block Echo 07/2015 >> nml LV fn P:  Increase labetalol PO 200 mg q8 with holding parameters. hydrallazine prn  RENAL A:  AKI  Metabolic acidosis - lactic vs ketoacids-resolved hypokalemia P:   Replete  Lytes as needed  GASTROINTESTINAL A:   No acute issues P:   TF per nutrition.  HEMATOLOGIC A:   No acute issues P:  Monitor SQ lovenox   INFECTIOUS A:   No acute issues P:   Monitor  ENDOCRINE A:   Diabetes with marked hyperglycemia P:   Off Insulin gtt since 5p on 4/25 Increased to  lantus 24 & SSI Diabetic educator following Accuchecks   NEUROLOGIC A:   Status epilepticus  Acute to subacute CVA on MRI noted. P:   Off propofol since no further seizure. Continue Keppra, Vimpat per neurology.   FAMILY  - Updates:  daughter 4/25   - Inter-disciplinary family meet or Palliative Care meeting due by:  day 7  SUmmary - hope to extubate today, OK for reintubation clarified by daughter - no trach  The patient is critically ill with multiple organ systems failure and  requires high complexity decision making for assessment and support, frequent evaluation and titration of therapies, application of advanced monitoring technologies and extensive interpretation of multiple databases. Critical Care Time devoted to patient care services described in this note independent of APP time is 35 minutes.   Cyril Mourningakesh Lesle Faron MD. Tonny BollmanFCCP. [facility] Pager [phone] If no response call [phone]     04/26/2016, 9:05 AM

## 2016-04-26 NOTE — Progress Notes (Signed)
Order received for Cortrak tube placement. RN spoke with RD and reports plan is to hold off on tube placement today; waiting on results from SLP evaluation. RN wants to keep Cortrak order in place in case patient needs feeding tube tomorrow.   Dorothea Ogleeanne Deran Barro RD, LDN Pager: [phone]

## 2016-04-26 NOTE — Progress Notes (Signed)
Pt extubated on TF going through OG, Cortrak orders placed, [place], charge RN will have someone call back to unit for placement, will continue to monitor.  Louie BunWilson,Arash Karstens S  11:20 AM

## 2016-04-26 NOTE — Evaluation (Signed)
Occupational Therapy Evaluation Patient Details Name: Dana Blair MRN: [id number] DOB: Aug 12, 1941 Today's Date: 04/26/2016    History of Present Illness Pt admitted with seizure in the setting of hyperglycemia. New onset afib. Required intubation 4/24-4/27. PMH: DM, CVA, HTN, CKD, syncope and glaucoma.   Clinical Impression   Pt extubated earlier, RN with orders to dangle pt at EOB and then place in chair position so proceeded with limited OT evaluation. Pt presents with generalized weakness, impaired cognition and decreased balance. She required 2 person assist for bed mobility and tolerated sitting EOB x 10 minutes on 2L 02 with VSS. Pt will need post acute rehab in SNF upon discharge. Will follow acutely.    Follow Up Recommendations  SNF;Supervision/Assistance - 24 hour    Equipment Recommendations   (defer to next venue)    Recommendations for Other Services       Precautions / Restrictions Precautions Precautions: Fall Restrictions Weight Bearing Restrictions: No      Mobility Bed Mobility Overal bed mobility: Needs Assistance;+2 for physical assistance Bed Mobility: Supine to Sit;Sit to Supine     Supine to sit: +2 for physical assistance;Mod assist Sit to supine: +2 for physical assistance;Mod assist   General bed mobility comments: verbal and tactile cues for sequence, assist for LEs and trunk  Transfers Overall transfer level:  (deferred, activity limited to EOB post extubation)                    Balance Overall balance assessment: Needs assistance Sitting-balance support: Feet supported Sitting balance-Leahy Scale: Poor Sitting balance - Comments: requiring min to min guard assist                                    ADL Overall ADL's : Needs assistance/impaired Eating/Feeding: NPO   Grooming: Wash/dry face;Minimal assistance;Sitting Grooming Details (indicate cue type and reason): wipes mouth with cues, increased  drooling Upper Body Bathing: Maximal assistance;Sitting   Lower Body Bathing: Total assistance;Bed level   Upper Body Dressing : Maximal assistance;Sitting   Lower Body Dressing: Total assistance;Bed level                       Vision Additional Comments: glasses are not available   Perception     Praxis      Pertinent Vitals/Pain Pain Assessment: No/denies pain     Hand Dominance     Extremity/Trunk Assessment Upper Extremity Assessment Upper Extremity Assessment: Generalized weakness;Difficult to assess due to impaired cognition   Lower Extremity Assessment Lower Extremity Assessment: Defer to PT evaluation   Cervical / Trunk Assessment Cervical / Trunk Assessment: Kyphotic   Communication Communication Communication: Expressive difficulties (pt with minimal verbalization, decreased management of saliv)   Cognition Arousal/Alertness: Awake/alert Behavior During Therapy: Flat affect Overall Cognitive Status: Impaired/Different from baseline Area of Impairment: Orientation;Attention;Memory;Following commands;Safety/judgement;Problem solving Orientation Level: Disoriented to;Time;Situation Current Attention Level: Sustained Memory: Decreased short-term memory Following Commands: Follows one step commands inconsistently;Follows one step commands with increased time (with multimodal cues at times) Safety/Judgement: Decreased awareness of safety;Decreased awareness of deficits   Problem Solving: Slow processing;Decreased initiation;Requires verbal cues;Difficulty sequencing;Requires tactile cues     General Comments       Exercises       Shoulder Instructions      Home Living Family/patient expects to be discharged to:: Private residence Living Arrangements: Children (son) Available Help at Discharge:  Family;Available PRN/intermittently Type of Home: House       Home Layout: One level     Bathroom Shower/Tub: ContractorTub/shower unit   Bathroom  Toilet: Standard     Home Equipment: None   Additional Comments: pt appears to be a poor historian      Prior Functioning/Environment Level of Independence: Independent  Gait / Transfers Assistance Needed: ambulates without a device          OT Diagnosis: Generalized weakness;Cognitive deficits   OT Problem List: Decreased strength;Decreased activity tolerance;Impaired balance (sitting and/or standing);Decreased coordination;Decreased cognition;Decreased safety awareness;Decreased knowledge of use of DME or AE   OT Treatment/Interventions: Self-care/ADL training;Therapeutic exercise;DME and/or AE instruction;Cognitive remediation/compensation;Patient/family education;Balance training;Therapeutic activities    OT Goals(Current goals can be found in the care plan section) Acute Rehab OT Goals Patient Stated Goal: wanted to lay back down OT Goal Formulation: Patient unable to participate in goal setting Time For Goal Achievement: 05/10/16 Potential to Achieve Goals: Good ADL Goals Pt Will Perform Eating: with supervision;sitting (when appropriate) Pt Will Perform Grooming: with supervision;sitting Pt Will Perform Upper Body Bathing: with min assist;sitting Pt Will Perform Upper Body Dressing: with min assist;sitting Pt Will Transfer to Toilet: with mod assist;stand pivot transfer;bedside commode Pt/caregiver will Perform Home Exercise Program: Both right and left upper extremity;With Supervision (AROM all areas)  OT Frequency: Min 2X/week   Barriers to D/C: Decreased caregiver support          Co-evaluation PT/OT/SLP Co-Evaluation/Treatment: Yes Reason for Co-Treatment: For patient/therapist safety;Complexity of the patient's impairments (multi-system involvement)   OT goals addressed during session: Strengthening/ROM      End of Session Equipment Utilized During Treatment: Oxygen (2L) Nurse Communication: Mobility status (ok to dangle EOB)  Activity Tolerance:  Patient tolerated treatment well (VSS) Patient left: in bed;with call bell/phone within reach;with nursing/sitter in room (in chair position)   Time: [id number] OT Time Calculation (min): 23 min Charges:  OT General Charges $OT Visit: 1 Procedure OT Evaluation $OT Eval Moderate Complexity: 1 Procedure G-Codes:    Evern BioMayberry, Jakory Matsuo Lynn 04/26/2016, 12:24 PM  [phone]

## 2016-04-26 NOTE — Care Management Important Message (Signed)
Important Message  Patient Details  Name: Scharlene GlossBarbara J Whisner MRN: [id number] Date of Birth: [birth year]   Medicare Important Message Given:  Yes    Kyla BalzarineShealy, Opha Mcghee Abena 04/26/2016, 1:34 PM

## 2016-04-26 NOTE — Procedures (Signed)
Extubation Procedure Note  Patient Details:   Name: Dana Blair DOB: 1941-01-23 MRN: [id number]   Airway Documentation:     Evaluation  O2 sats: stable throughout Complications: No apparent complications Patient did tolerate procedure well. Bilateral Breath Sounds: Rhonchi   Yes   Pt extubated to 3L [place] per MD order with no complications. Pt able to speak in a low whisper but has a very weak cough. Pt has increased secretions and is worrisome for possible re-intubation if unable to clear them. RN and MD aware. Pt given Yankauer and encouraged to use it to remove secretions. RT will closely monitor.   Carolan ShiverKelley, Korie Brabson M 04/26/2016, 11:13 AM

## 2016-04-27 DIAGNOSIS — J9600 Acute respiratory failure, unspecified whether with hypoxia or hypercapnia: Secondary | ICD-10-CM | POA: Insufficient documentation

## 2016-04-27 LAB — GLUCOSE, CAPILLARY
GLUCOSE-CAPILLARY: 98 mg/dL (ref 65–99)
GLUCOSE-CAPILLARY: 129 mg/dL — AB (ref 65–99)
GLUCOSE-CAPILLARY: 85 mg/dL (ref 65–99)
Glucose-Capillary: 126 mg/dL — ABNORMAL HIGH (ref 65–99)
Glucose-Capillary: 127 mg/dL — ABNORMAL HIGH (ref 65–99)
Glucose-Capillary: 198 mg/dL — ABNORMAL HIGH (ref 65–99)
Glucose-Capillary: 272 mg/dL — ABNORMAL HIGH (ref 65–99)

## 2016-04-27 LAB — BASIC METABOLIC PANEL
Anion gap: 7 (ref 5–15)
BUN: 24 mg/dL — AB (ref 6–20)
CO2: 24 mmol/L (ref 22–32)
CREATININE: 0.87 mg/dL (ref 0.44–1.00)
Calcium: 8.6 mg/dL — ABNORMAL LOW (ref 8.9–10.3)
Chloride: 113 mmol/L — ABNORMAL HIGH (ref 101–111)
GFR calc Af Amer: >60 mL/min (ref >60)
GLUCOSE: 107 mg/dL — AB (ref 65–99)
POTASSIUM: 4.1 mmol/L (ref 3.5–5.1)
Sodium: 144 mmol/L (ref 135–145)

## 2016-04-27 LAB — CBC
HEMATOCRIT: 34 % — AB (ref 36.0–46.0)
Hemoglobin: 10.8 g/dL — ABNORMAL LOW (ref 12.0–15.0)
MCH: 26.5 pg (ref 26.0–34.0)
MCHC: 31.8 g/dL (ref 30.0–36.0)
MCV: 83.5 fL (ref 78.0–100.0)
Platelets: 227 10*3/uL (ref 150–400)
RBC: 4.07 MIL/uL (ref 3.87–5.11)
RDW: 14.7 % (ref 11.5–15.5)
WBC: 7.7 10*3/uL (ref 4.0–10.5)

## 2016-04-27 MED ORDER — LEVETIRACETAM 500 MG PO TABS
1000.0000 mg | ORAL_TABLET | Freq: Two times a day (BID) | ORAL | Status: DC
  Administered 2016-04-27 – 2016-05-01 (×8): 1000 mg via ORAL
  Filled 2016-04-27 (×8): qty 2

## 2016-04-27 NOTE — Progress Notes (Signed)
PULMONARY / CRITICAL CARE MEDICINE   Name: Dana Blair MRN: [id number] DOB: February 04, 1941    ADMISSION DATE:  04/23/2016 CONSULTATION DATE:  04/23/16  REFERRING MD:  EDP  CHIEF COMPLAINT:  seizures  HISTORY OF PRESENT ILLNESS:   Dana Blair is a 66F with history of prior ischemic and hemorrhagic strokes who presents to the ED via EMS after her family found her seizing with R gaze deviation. Last known normal was around 10pm on 4/23. She continued to seize with mostly right-sided involvement, but occasional left-sided symptoms as well. She received  versed en route. She was desaturating and required intubation. CT head was negative for blood.  On arrival to the ED she was also in atrial fibrillation, which reportedly is new for her. She was placed on propofol for seizure suppression, loaded with keppra, given additional benzos, and vimpat was also added.  SUBJECTIVE:  afebrile Bp better No obvious pain   VITAL SIGNS: BP 131/71 mmHg  Pulse 102  Temp(Src) 98.5 F (36.9 C) (Oral)  Resp 19  Ht  (1.626 m)  Wt 123 lb 3.8 oz (55.9 kg)  BMI 21.14 kg/m2  SpO2 98%  HEMODYNAMICS: CVP:  [7 mmHg] 7 mmHg  VENTILATOR SETTINGS:    INTAKE / OUTPUT: I/O last 3 completed shifts: In: 1820 [I.V.:710; NG/GT:625; IV Piggyback:485] Out: 1105 [Urine:1105]  PHYSICAL EXAMINATION:  General Well developed, no apparent distress  HEENT No gross abnormalities.  Sullivan/AT, PERRL, EOM spontaneous.  Pulmonary Clear to auscultation bilaterally with no wheezes, rales or ronchi. No respiratory effort on PS.   Cardiovascular Tachycardic, regular rhythm. S1, s2. No m/r/g. Distal pulses palpable.  Abdomen Soft, non-tender, non-distended, positive bowel sounds, no palpable organomegaly or masses. Normoresonant to percussion.  Musculoskeletal No bony abnormalities  Lymphatics No cervical, supraclavicular or axillary adenopathy.   Neurologic Pupils reactive. Moving left side spontaneously, less movement  on the right.  Skin/Integuement Diffuse actinic keratoses, some areas on the lower abdomen that are more scaly appearing. No cyanosis, no clubbing.     LABS:  BMET  Recent Labs Lab 04/25/16 0424 04/26/16 0433 04/27/16 0404  NA 139 141 144  K 3.2* 3.6 4.1  CL 107 110 113*  CO2 BUN 18 26* 24*  CREATININE 0.84 0.84 0.87  GLUCOSE 244* 172* 107*   Electrolytes  Recent Labs Lab 04/24/16 0335 04/25/16 0424 04/26/16 0433 04/27/16 0404  CALCIUM 8.9 8.6* 8.5* 8.6*  MG 2.0 1.8 2.4  --   PHOS 2.4* 2.2* 3.9  --    CBC  Recent Labs Lab 04/25/16 0424 04/26/16 0433 04/27/16 0404  WBC 11.8* 10.2 7.7  HGB 11.8* 11.4* 10.8*  HCT 36.5 35.1* 34.0*  PLT 193 208 227   Coag's  Recent Labs Lab 04/23/16 0150  APTT 26  INR 0.87   Sepsis Markers  Recent Labs Lab 04/23/16 0853 04/23/16 1341 04/23/16 1644  LATICACIDVEN 4.6* 1.8 1.0   ABG  Recent Labs Lab 04/23/16 0300 04/24/16 0722 04/25/16 0352  PHART 7.407 7.475* 7.498*  PCO2ART 45.7* 36.4 31.7*  PO2ART 409.0* 179.0* 180*   Liver Enzymes  Recent Labs Lab 04/23/16 0150  AST 42*  ALT 30  ALKPHOS 126  BILITOT 0.7  ALBUMIN 3.9   Cardiac Enzymes No results for input(s): TROPONINI, PROBNP in the last 168 hours.  Glucose  Recent Labs Lab 04/26/16 1640 04/26/16 1936 04/27/16 0016 04/27/16 0401 04/27/16 0935 04/27/16 1130  GLUCAP 138* 107* 126* 98 129* 85   Imaging No results  found. STUDIES:  CT and CTA head >>neg, Stable moderate to severe right and more mild proximal left ACA stenoses. MRI 4/24 with 9mm rt acute/sub acute CVA, Chronic hemorrhagic right basal ganglia infarct, with additional chronic lacunar infarcts within the thalami  EEG 4/25 >>Possible focal slowing on the left with loss of overriding faster frequencies, Diffuse slowing.  CULTURES: None  ANTIBIOTICS: None  SIGNIFICANT EVENTS:   LINES/TUBES: 4/24 ETT >> 4/27 4/24 OGT 4/24 Foley 4/24 L IJ  TLC>>>  DISCUSSION: 21F w/ history of prior strokes presents with new onset status epilepticus. No prior history of seizures. There is concern for underlying stroke as a potential inciting event, but imaging has thus far been unrevealing. She requires ventilatory support as she is unable to protect her airway. She was also hyperglycemic with an anion gap of 21 and HCO3 19.   ASSESSMENT / PLAN:  PULMONARY A: Acute respiratory insufficiency / inability to protect airway 2/2 status epilepticus P:   extubated OK for reintubation clarified by daughter - no trach Pulm hygiene mobilise  CARDIOVASCULAR A:  Hypertension / labile BP A flutter - new , back in nSR Hx 1st degree AV block Echo 07/2015 >> nml LV fn P:  Increase labetalol PO 200 mg q8 with holding parameters. hydrallazine prn  RENAL A:  AKI  Metabolic acidosis - lactic vs ketoacids-resolved hypokalemia P:   Replete  Lytes as needed  GASTROINTESTINAL A:   No acute issues P:   Swallow eal & advance  HEMATOLOGIC A:   No acute issues P:  Monitor SQ lovenox   INFECTIOUS A:   No acute issues P:   Monitor  ENDOCRINE A:   Diabetes with marked hyperglycemia P:   Off Insulin gtt since 5p on 4/25 Increased to  lantus 24 & SSI Diabetic educator following Accuchecks   NEUROLOGIC A:   Status epilepticus  Acute to subacute CVA on MRI noted. P:    Continue Keppra, dc Vimpat per neurology.   FAMILY  - Updates:  daughter 4/25   - Inter-disciplinary family meet or Palliative Care meeting due by:  NA  SUmmary - Transfer tot ele & to [facility] 4/29     Cyril Mourningakesh Toure Edmonds MD. [facility].  Pulmonary & Critical care Pager [phone] If no response call [phone]     04/27/2016, 12:46 PM

## 2016-04-27 NOTE — Evaluation (Signed)
Speech Language Pathology Evaluation Patient Details Name: Dana GlossBarbara J Johnstone MRN: [id number] DOB: 1941/12/04 Today's Date: 04/27/2016 Time: [id number] SLP Time Calculation (min) (ACUTE ONLY): 18 min  Problem List:  Patient Active Problem List   Diagnosis Date Noted  . Acute respiratory failure (HCC)   . Status epilepticus (HCC) 04/23/2016  . Encounter for central line placement   . Hydronephrosis   . Leukocytosis   . Severe protein-calorie malnutrition (HCC) 07/28/2015  . Syncope and collapse   . Chronic diastolic congestive heart failure (HCC)   . LVH (left ventricular hypertrophy)   . Orthostatic hypotension   . Lactic acidosis   . AKI (acute kidney injury) (HCC) 07/26/2015  . Dehydration 07/26/2015  . Occult blood positive stool 07/26/2015  . Essential hypertension 08/13/2014  . Hyperlipidemia 08/13/2014  . History of stroke 08/13/2014  . DM2 (diabetes mellitus, type 2) (HCC) 08/13/2014  . Mild mitral regurgitation 08/13/2014  . First degree AV block 08/13/2014   Past Medical History:  Past Medical History  Diagnosis Date  . Diabetes (HCC)     Diabetes mellitus with chronic kidney disease   . Hypertension     [facility]- ECHO 06/29/09 - LVH, normal EF, renal ultrasound-no renal artery stenosis  . Hyperlipidemia   . Stroke [facility](HCC)     Right caudate stroke (6/09) - also had ICH. She had concomitant respiratory failure. On May 22, 2008, CT of the head without contrast demonstrated a 9 x 20 mm acute hematoma within the head of the right caudate with intraventricular extension of hemorrhage. Neurosurgery consult-no ventriculostomy.  . Glaucoma   . Osteopenia   . Chronic kidney disease   . Syncope 07/26/2015   Past Surgical History:  Past Surgical History  Procedure Laterality Date  . Other surgical history  1970    Hysterectomy  . Abdominal hysterectomy    . Flexible sigmoidoscopy N/A 07/30/2015    Procedure: FLEXIBLE SIGMOIDOSCOPY;  Surgeon: Dorena CookeyJohn Hayes, MD;  [facility];  Service: Endoscopy;  Laterality: N/A;  2 fleets enemas at 7:30 AM on July 30   HPI:  Pt admitted with seizure in the setting of hyperglycemia. New onset afib. Required intubation 4/24-4/27. PMH: DM, CVA, HTN, CKD, syncope and glaucoma.   Assessment / Plan / Recommendation Clinical Impression  Pt is oriented to person only and requires Mod cues for sustained attention to basic, functional tasks. She follows one-step commands with Min-Mod verbal/visual cues. It is unclear what her baseline level of cognition is - will continue to follow and progress as appropriate.    SLP Assessment  Patient needs continued Speech Lanaguage Pathology Services    Follow Up Recommendations  Skilled Nursing facility    Frequency and Duration min 2x/week  2 weeks      SLP Evaluation Prior Functioning  Cognitive/Linguistic Baseline: Information not available   Cognition  Overall Cognitive Status: No family/caregiver present to determine baseline cognitive functioning Arousal/Alertness: Awake/alert Orientation Level: Oriented to person;Disoriented to place;Disoriented to situation Attention: Sustained Sustained Attention: Impaired Sustained Attention Impairment: Functional basic Memory: Impaired Memory Impairment: Storage deficit;Retrieval deficit;Decreased recall of new information Awareness: Impaired Awareness Impairment: Intellectual impairment;Emergent impairment;Anticipatory impairment Problem Solving: Impaired Problem Solving Impairment: Functional basic Safety/Judgment: Impaired    Comprehension  Auditory Comprehension Overall Auditory Comprehension: Impaired Commands: Impaired One Step Basic Commands: 50-74% accurate    Expression Expression Primary Mode of Expression: Verbal Verbal Expression Overall Verbal Expression:  (basic communication WFL, needs further assessment)   Oral / Motor  Oral Motor/Sensory Function Overall  Oral Motor/Sensory Function: Mild  impairment Motor Speech Overall Motor Speech: Appears within functional limits for tasks assessed   GO                   Maxcine Ham, M.A. CCC-SLP [phone]  Maxcine Ham 04/27/2016, 3:20 PM

## 2016-04-27 NOTE — Progress Notes (Signed)
Subjective: Extubated, doing much better  Exam: Filed Vitals:   04/27/16 0700 04/27/16 0800  BP: 160/81 162/86  Pulse: 86 92  Temp:    Resp: 27 18   Gen: In bed, NAD Resp: non-labored breathing, no acute distress Abd: soft, nt  Neuro: MS: awake, alert, follows commands, does not know month or year.  OZ:HYQMVCN:PERRL Motor: moves bilateral arms spontaneously.  Sensory:withdraws to nox stim bilaterally.   Pertinent Labs: Elevated glucose.   Impression: 75 yo F with new onset seizures in the setting of hyperglycemia and previous strokes. She does have small subcortical infarct which I feel is incidental. It does not appear embolic, but I would favor small vessel disease management.   I do nto think that she definitely needs a two drug regimen, will stop vimpat and continue keppra.   Recommendations: 1) continue  keppra 1g BID 2) will need PT,OT 3) d/c vimpat.  4) will follow.   Ritta SlotMcNeill Umi Mainor, MD [facility] [phone]  If 7pm- 7am, please page neurology on call as listed in AMION.

## 2016-04-27 NOTE — Progress Notes (Signed)
Patient tranferred to 3E. Patient's VS were stable and in good condition. Transferred via bed and receiving Nurse was at bedside. Patient didn't have any belongings with her. Family notified of the patient's transfer.   Marlou PorchBradley Sada Mazzoni 6:37 PM

## 2016-04-27 NOTE — Evaluation (Signed)
Clinical/Bedside Swallow Evaluation Patient Details  Name: Dana Blair MRN: [id number] Date of Birth: 1941-01-30  Today's Date: 04/27/2016 Time: SLP Start Time (ACUTE ONLY): 1153 SLP Stop Time (ACUTE ONLY): 1211 SLP Time Calculation (min) (ACUTE ONLY): 18 min  Past Medical History:  Past Medical History  Diagnosis Date  . Diabetes (HCC)     Diabetes mellitus with chronic kidney disease   . Hypertension     [facility]- ECHO 06/29/09 - LVH, normal EF, renal ultrasound-no renal artery stenosis  . Hyperlipidemia   . Stroke [facility](HCC)     Right caudate stroke (6/09) - also had ICH. She had concomitant respiratory failure. On May 22, 2008, CT of the head without contrast demonstrated a 9 x 20 mm acute hematoma within the head of the right caudate with intraventricular extension of hemorrhage. Neurosurgery consult-no ventriculostomy.  . Glaucoma   . Osteopenia   . Chronic kidney disease   . Syncope 07/26/2015   Past Surgical History:  Past Surgical History  Procedure Laterality Date  . Other surgical history  1970    Hysterectomy  . Abdominal hysterectomy    . Flexible sigmoidoscopy N/A 07/30/2015    Procedure: FLEXIBLE SIGMOIDOSCOPY;  Surgeon: Dorena CookeyJohn Hayes, MD;  [facility];  Service: Endoscopy;  Laterality: N/A;  2 fleets enemas at 7:30 AM on July 30   HPI:  Pt admitted with seizure in the setting of hyperglycemia. New onset afib. Required intubation 4/24-4/27. PMH: DM, CVA, HTN, CKD, syncope and glaucoma.   Assessment / Plan / Recommendation Clinical Impression  Pt consumed PO trials without any overt s/s of aspiration. Her voice is mildly soft but clear, and she appears to have appropriate sequencing of breath/swallow. Given prolonged mastication and current mentation, recommend Dys 2 diet and thin liquids. Will follow for tolerance.    Aspiration Risk  Mild aspiration risk    Diet Recommendation Dysphagia 2 (Fine chop);Thin liquid   Liquid Administration via:  Cup;Straw Medication Administration: Whole meds with puree Supervision: Patient able to self feed;Full supervision/cueing for compensatory strategies Compensations: Minimize environmental distractions;Slow rate;Small sips/bites Postural Changes: Seated upright at 90 degrees;Remain upright for at least 30 minutes after po intake    Other  Recommendations Oral Care Recommendations: Oral care BID   Follow up Recommendations   (tba)    Frequency and Duration min 2x/week  2 weeks       Prognosis Prognosis for Safe Diet Advancement: Good Barriers to Reach Goals: Cognitive deficits      Swallow Study   General HPI: Pt admitted with seizure in the setting of hyperglycemia. New onset afib. Required intubation 4/24-4/27. PMH: DM, CVA, HTN, CKD, syncope and glaucoma. Type of Study: Bedside Swallow Evaluation Previous Swallow Assessment: none in chart Diet Prior to this Study: NPO Temperature Spikes Noted: Yes (100.6) Respiratory Status: Room air History of Recent Intubation: Yes Length of Intubations (days): 3 days Date extubated: 04/26/16 Behavior/Cognition: Alert;Cooperative;Pleasant mood;Confused;Requires cueing Oral Cavity Assessment: Within Functional Limits Oral Care Completed by SLP: No Vision: Functional for self-feeding Self-Feeding Abilities: Able to feed self Patient Positioning: Upright in bed Baseline Vocal Quality: Normal Volitional Cough: Weak Volitional Swallow: Able to elicit    Oral/Motor/Sensory Function Overall Oral Motor/Sensory Function: Mild impairment   Ice Chips Ice chips: Within functional limits Presentation: Spoon   Thin Liquid Thin Liquid: Impaired Presentation: Cup;Self Fed;Straw Pharyngeal  Phase Impairments: Suspected delayed Swallow    Nectar Thick Nectar Thick Liquid: Not tested   Honey Thick Honey Thick Liquid: Not tested  Puree Puree: Within functional limits Presentation: Self Fed;Spoon   Solid   GO   Solid: Impaired Presentation:  Self Fed Oral Phase Impairments: Impaired mastication       Maxcine Ham, M.A. CCC-SLP [phone]  Maxcine Ham 04/27/2016,3:15 PM

## 2016-04-28 LAB — GLUCOSE, CAPILLARY
Glucose-Capillary: 205 mg/dL — ABNORMAL HIGH (ref 65–99)
Glucose-Capillary: 142 mg/dL — ABNORMAL HIGH (ref 65–99)
Glucose-Capillary: 295 mg/dL — ABNORMAL HIGH (ref 65–99)
Glucose-Capillary: 260 mg/dL — ABNORMAL HIGH (ref 65–99)
Glucose-Capillary: 232 mg/dL — ABNORMAL HIGH (ref 65–99)

## 2016-04-28 MED ORDER — INSULIN GLARGINE 100 UNIT/ML ~~LOC~~ SOLN
15.0000 [IU] | Freq: Every day | SUBCUTANEOUS | Status: DC
  Administered 2016-04-28 – 2016-04-29 (×2): 15 [IU] via SUBCUTANEOUS
  Filled 2016-04-28 (×2): qty 0.15

## 2016-04-28 MED ORDER — LIVING WELL WITH DIABETES BOOK
Freq: Once | Status: AC
  Administered 2016-04-28: 16:00:00
  Filled 2016-04-28: qty 1

## 2016-04-28 MED ORDER — INSULIN STARTER KIT- PEN NEEDLES (ENGLISH)
1.0000 | Freq: Once | Status: AC
  Administered 2016-04-28: 1
  Filled 2016-04-28: qty 1

## 2016-04-28 MED ORDER — INSULIN ASPART 100 UNIT/ML ~~LOC~~ SOLN
0.0000 [IU] | Freq: Every day | SUBCUTANEOUS | Status: DC
  Administered 2016-04-28: 2 [IU] via SUBCUTANEOUS

## 2016-04-28 MED ORDER — DEXTROSE 5 % IV SOLN
1.0000 g | INTRAVENOUS | Status: AC
  Administered 2016-04-29: 1 g via INTRAVENOUS
  Filled 2016-04-28: qty 10

## 2016-04-28 MED ORDER — INSULIN ASPART 100 UNIT/ML ~~LOC~~ SOLN
0.0000 [IU] | Freq: Three times a day (TID) | SUBCUTANEOUS | Status: DC
  Administered 2016-04-28 – 2016-04-29 (×3): 5 [IU] via SUBCUTANEOUS
  Administered 2016-04-29: 2 [IU] via SUBCUTANEOUS
  Administered 2016-04-29 – 2016-04-30 (×2): 7 [IU] via SUBCUTANEOUS
  Administered 2016-04-30 – 2016-05-01 (×4): 2 [IU] via SUBCUTANEOUS

## 2016-04-28 NOTE — Progress Notes (Signed)
Subjective: Continues to improve  Exam: Filed Vitals:   04/27/16 2121 04/28/16 0426  BP: 171/81 138/61  Pulse: 79 79  Temp: 98.4 F (36.9 C) 98.5 F (36.9 C)  Resp: 18 18   Gen: In bed, NAD Resp: non-labored breathing, no acute distress Abd: soft, nt  Neuro: MS: awake, alert, follows commands, gives correct month and year.  ZO:XWRUECN:PERRL Motor: Follows all commands., has symmetric strength in UE Sensory: intact to LT  Impression: 75 yo F with new onset seizures in the setting of hyperglycemia and previous strokes. She does have small subcortical infarct which I feel is incidental. It does not appear embolic, but I would favor small vessel disease management.   I do not think that she definitely needs a two drug regimen, will stop vimpat and continue keppra. She was hyperglycemic, but I am not sure that this is fully responsible for her seizures given that she does have clear potential seizure foci on MRI(previous strokes). I think that in her case, I would continue AED therapy.   I suspect some baseline dementia compounded by ICU delirium vs some seizure effect as well given that she was in status. With her improvement, I would expect continued return towards her baseline though this may take quite some time.   Recommendations: 1) continue  keppra 1g BID 2) will need PT,OT 3) continue statin for LDL > 70. 4) No further workup needed at this time. Please call with any further questions or concerns, neuro to sign off.    Ritta SlotMcNeill Jamyah Folk, MD [facility] [phone]  If 7pm- 7am, please page neurology on call as listed in AMION.

## 2016-04-28 NOTE — Progress Notes (Signed)
Noted consult for diabetes coordinator. Diabetes Coordinator is not on campus over the weekend but is available by pager for questions or concerns. According to Dr. Keturah Barre note for today patient will discharge new to insulin and will need to be educated on insulin administration. Ordered Living Well with Diabetes booklet, insulin pen starter kit, patient education videos on DM and insulin administration, and order for nursing to educate patient. NURSING: Please use each patient interaction to provide diabetes education. Please review Living Well with Diabetes booklet with the patient, have patient watch patient education videos on diabetes, and instruct on insulin administration. Please allow patient to be actively engaged with diabetes management by allowing patient to check own glucose and self-administer insulin injections.   Thanks, Barnie Alderman, RN, MSN, CDE Diabetes Coordinator Inpatient Diabetes Program [phone] (Team Pager from [place] to [place]) [phone] ([facility] office) [phone] [facility] office) [phone] [facility] office)

## 2016-04-28 NOTE — Progress Notes (Addendum)
PROGRESS NOTE                                                                                                                                                                                                             Patient Demographics:    Dana Blair, is a 75 y.o. female, DOB - 12-14-1941, CWC:[id number]  Admit date - 04/23/2016   Admitting Physician Dannielle Burn, MD  Outpatient Primary MD for the patient is Lynne Logan, MD  LOS - 5  Outpatient Specialists:   Chief Complaint  Patient presents with  . Code Stroke       Brief Narrative   75 year old African-American female with previous history of ischemic stroke, DM type II, essential hypertension, dyslipidemia who was admitted by pulmonary critical care on 04/23/2016 with status epilepticus. She initially required intubation to protect her airway, she was seen by neurology, was found to have incidental new ischemic stroke, she has now been transitioned to [place] and is seizure free. Stabilized and transferred to the floor under my care on 04/28/2016 day 5 of her hospital stay.   Subjective:    Dana Blair today has, No headache, No chest pain, No abdominal pain - No Nausea, No new weakness tingling or numbness, No Cough - SOB.    Assessment  & Plan :     1.Status epilepticus. Resolved. Initial intubation for airway protection, now stable, neuro on board, per neuro continue Keppra. Increase activity and PT eval. May require placement.  2. History of multiple strokes in the past, incidental finding of new ischemic stroke on MRI. Neuro has seen the patient, full stroke protocol has been followed, currently on aspirin and statin to be continued. Outpatient neuro follow-up. No further stroke workup per neurology. Intracranial atherosclerosis noted on CT angiogram. Secondary prevention per neurology.  3. Dysphagia. Due to combination of #1 and 2. Speech  therapy following. Currently on dysphagia 2 diet.  4. DM type II with DKA upon admission. In poor control A1c greater than 10, place on Lantus along with sliding scale, diabetic education.  5. Dyslipidemia. On statin continue present dose.  6. GERD. On H2 blocker continue.  7. Escherichia coli UTI. Discontinue Foley catheter, Rocephin 3 days. Clinically resolved.  8. Foley catheter placement and central line placement in the ER. We'll discontinue on 04/28/2016.  9. Hypertension. On  beta blocker continue.    Code Status : Full  Family Communication  : None present  Disposition Plan  : SNF versus home health PT  Barriers For Discharge : PT evaluation, placement  Consults  :  PCCM, Neuro  Procedures  :   CT head, CT angiogram head and neck. MRI brain. Small acute ischemic infarct, intracranial diffuse atherosclerosis.  EEG -   1) Possible focal slowing on the left with loss of overriding faster frequencies 2) Diffuse slowing.  CLINICAL CORRELATION: This EEG is consistent with diffuse non-specific cerebral dysfunction, with suggestion of focal neuronal dysfunction on the left. No electrographic seizures were seen  4/24 ETT >> 4/27 4/24 OGT 4/24 Foley 4/24 L IJ TLC>>>  DVT Prophylaxis  :  Lovenox    Lab Results  Component Value Date   PLT 227 04/27/2016    Antibiotics  :    Anti-infectives    Start     Dose/Rate Route Frequency Ordered Stop   04/25/16 1045  cefTRIAXone (ROCEPHIN) 1 g in dextrose 5 % 50 mL IVPB     1 g 100 mL/hr over 30 Minutes Intravenous Every 24 hours 04/25/16 1031 05/02/16 1044        Objective:   Filed Vitals:   04/27/16 1757 04/27/16 2121 04/28/16 0426 04/28/16 1025  BP: 151/65 171/81 138/61 120/80  Pulse: 84 79 79 76  Temp: 99.8 F (37.7 C) 98.4 F (36.9 C) 98.5 F (36.9 C)   TempSrc: Oral Oral Oral   Resp: '18 18 18   '$ Height: '5\' 9"'$  (1.753 m)     Weight: 55.52 kg (122 lb 6.4 oz)  57.3 kg (126 lb 5.2 oz)   SpO2: 100% 100% 100%      Wt Readings from Last 3 Encounters:  04/28/16 57.3 kg (126 lb 5.2 oz)  09/29/15 51.71 kg (114 lb)  08/02/15 53.479 kg (117 lb 14.4 oz)     Intake/Output Summary (Last 24 hours) at 04/28/16 1046 Last data filed at 04/28/16 1033  Gross per 24 hour  Intake   1080 ml  Output   1276 ml  Net   -196 ml     Physical Exam  Awake, Pleasantly confused, No new F.N deficits, Normal affect [place].AT,PERRAL Supple Neck,No JVD, No cervical lymphadenopathy appriciated.  Symmetrical Chest wall movement, Good air movement bilaterally, CTAB RRR,No Gallops,Rubs or new Murmurs, No Parasternal Heave +ve B.Sounds, Abd Soft, No tenderness, No organomegaly appriciated, No rebound - guarding or rigidity. No Cyanosis, Clubbing or edema, No new Rash or bruise      Data Review:    CBC  Recent Labs Lab 04/23/16 0150  04/23/16 0853 04/24/16 0335 04/25/16 0424 04/26/16 0433 04/27/16 0404  WBC 10.7*  --  12.3* 14.5* 11.8* 10.2 7.7  HGB 15.0  < > 14.2 12.6 11.8* 11.4* 10.8*  HCT 46.3*  < > 44.6 37.8 36.5 35.1* 34.0*  PLT 260  --  244 223 193 208 227  MCV 84.2  --  83.1 80.1 80.9 81.3 83.5  MCH 27.3  --  26.4 26.7 26.2 26.4 26.5  MCHC 32.4  --  31.8 33.3 32.3 32.5 31.8  RDW 13.2  --  13.4 13.6 14.0 14.4 14.7  LYMPHSABS 2.3  --   --   --   --   --   --   MONOABS 0.5  --   --   --   --   --   --   EOSABS 0.0  --   --   --   --   --   --  BASOSABS 0.0  --   --   --   --   --   --   < > = values in this interval not displayed.  Chemistries   Recent Labs Lab 04/23/16 0150  04/23/16 0853 04/24/16 0335 04/25/16 0424 04/26/16 0433 04/27/16 0404  NA 135  < > 145 141 139 141 144  K 4.2  < > 4.2 2.9* 3.2* 3.6 4.1  CL 95*  < > 106 107 107 110 113*  CO2 19*  --  '26 23 22 23 24  '$ GLUCOSE 670*  < > 276* 187* 244* 172* 107*  BUN 21*  < > 18 28* 18 26* 24*  CREATININE 1.39*  < > 1.23* 1.28* 0.84 0.84 0.87  CALCIUM 9.4  --  8.9 8.9 8.6* 8.5* 8.6*  MG  --   --  1.9 2.0 1.8 2.4  --   AST 42*  --    --   --   --   --   --   ALT 30  --   --   --   --   --   --   ALKPHOS 126  --   --   --   --   --   --   BILITOT 0.7  --   --   --   --   --   --   < > = values in this interval not displayed. ------------------------------------------------------------------------------------------------------------------ No results for input(s): CHOL, HDL, LDLCALC, TRIG, CHOLHDL, LDLDIRECT in the last 72 hours.  Lab Results  Component Value Date   HGBA1C 9.6* 04/24/2016   CBG (last 3)   Recent Labs  04/27/16 2351 04/28/16 0416 04/28/16 0802  GLUCAP 198* 205* 142*   ------------------------------------------------------------------------------------------------------------------ No results for input(s): TSH, T4TOTAL, T3FREE, THYROIDAB in the last 72 hours.  Invalid input(s): FREET3 ------------------------------------------------------------------------------------------------------------------ No results for input(s): VITAMINB12, FOLATE, FERRITIN, TIBC, IRON, RETICCTPCT in the last 72 hours.  Coagulation profile  Recent Labs Lab 04/23/16 0150  INR 0.87    No results for input(s): DDIMER in the last 72 hours.  Cardiac Enzymes No results for input(s): CKMB, TROPONINI, MYOGLOBIN in the last 168 hours.  Invalid input(s): CK ------------------------------------------------------------------------------------------------------------------ No results found for: BNP  Inpatient Medications  Scheduled Meds: . antiseptic oral rinse  7 mL Mouth Rinse QID  . aspirin  324 mg Oral Daily  . atorvastatin  20 mg Oral q1800  . cefTRIAXone (ROCEPHIN)  IV  1 g Intravenous Q24H  . chlorhexidine gluconate (SAGE KIT)  15 mL Mouth Rinse BID  . enoxaparin (LOVENOX) injection  30 mg Subcutaneous Q24H  . famotidine  20 mg Per Tube BID  . insulin aspart  0-15 Units Subcutaneous Q4H  . labetalol  100 mg Oral TID  . levETIRAcetam  1,000 mg Oral Q12H  . sodium chloride flush  10-40 mL Intracatheter Q12H     Continuous Infusions: . sodium chloride 20 mL/hr at 04/27/16 1700   PRN Meds:.sodium chloride, fentaNYL (SUBLIMAZE) injection, hydrALAZINE, labetalol, sodium chloride flush  Micro Results Recent Results (from the past 240 hour(s))  Urine culture     Status: Abnormal   Collection Time: 04/23/16  3:28 AM  Result Value Ref Range Status   Specimen Description URINE, CATHETERIZED  Final   Special Requests NONE  Final   Culture >=100,000 COLONIES/mL ESCHERICHIA COLI (A)  Final   Report Status 04/25/2016 FINAL  Final   Organism ID, Bacteria ESCHERICHIA COLI (A)  Final  Susceptibility   Escherichia coli - MIC*    AMPICILLIN <=2 SENSITIVE Sensitive     CEFAZOLIN <=4 SENSITIVE Sensitive     CEFTRIAXONE <=1 SENSITIVE Sensitive     CIPROFLOXACIN <=0.25 SENSITIVE Sensitive     GENTAMICIN <=1 SENSITIVE Sensitive     IMIPENEM <=0.25 SENSITIVE Sensitive     NITROFURANTOIN <=16 SENSITIVE Sensitive     TRIMETH/SULFA <=20 SENSITIVE Sensitive     AMPICILLIN/SULBACTAM <=2 SENSITIVE Sensitive     PIP/TAZO <=4 SENSITIVE Sensitive     * >=100,000 COLONIES/mL ESCHERICHIA COLI  MRSA PCR Screening     Status: None   Collection Time: 04/23/16  5:23 AM  Result Value Ref Range Status   MRSA by PCR NEGATIVE NEGATIVE Final    Comment:        The GeneXpert MRSA Assay (FDA approved for NASAL specimens only), is one component of a comprehensive MRSA colonization surveillance program. It is not intended to diagnose MRSA infection nor to guide or monitor treatment for MRSA infections.     Radiology Reports Ct Angio Head W/cm &/or Wo Cm  04/23/2016  CLINICAL DATA:  Initial evaluation for acute seizure, right gaze deviation. EXAM: CT ANGIOGRAPHY HEAD AND NECK TECHNIQUE: Multidetector CT imaging of the head and neck was performed using the standard protocol during bolus administration of intravenous contrast. Multiplanar CT image reconstructions and MIPs were obtained to evaluate the vascular anatomy.  Carotid stenosis measurements (when applicable) are obtained utilizing NASCET criteria, using the distal internal carotid diameter as the denominator. CONTRAST:  80 cc of Isovue 370. COMPARISON:  Prior noncontrast head CT from earlier same day. FINDINGS: Study is limited by a technical error, with the mid and superior aspect of the the brain not visualized on arterial phase imaging. CTA NECK Aortic arch: Visualized aortic arch of normal caliber with normal 3 vessel morphology. Scattered calcified plaque within the arch itself and about the origin of the great vessels without flow-limiting stenosis. Scattered calcified plaque within the proximal subclavian arteries with mild narrowing. Subclavian arteries otherwise patent. Right carotid system: Right common carotid artery patent from its origin to the bifurcation. Mild plaque about the proximal right ICA with associated narrowing of approximately 30% by NASCET criteria. Probable small penetrating ulcer present at this level as well. Right ICA patent distally without stenosis, occlusion, or dissection. Multifocal atheromatous irregularity within the right ICA without high-grade stenosis. Right external carotid artery and its branches grossly normal. Left carotid system: Left common carotid artery patent from its origin to the bifurcation. Eccentric calcified plaque about the proximal left ICA with associated narrowing of approximately 30% by NASCET criteria. Distally, left ICA patent to the skullbase without additional stenosis, dissection, or occlusion. Multi focal atheromatous irregularity without high-grade stenosis. Left external carotid artery and its branches within normal limits. Vertebral arteries:Both vertebral arteries arise from the subclavian arteries. Right vertebral artery is dominant. Vertebral arteries patent along their entire course without stenosis, dissection, or occlusion. Skeleton: No acute osseous abnormality. Multilevel degenerative  spondylolysis within the cervical spine, most prevalent at C5-6 and C6-7. Well-circumscribed lucent lesion within the C5 vertebral body favored to be benign. Other neck: Partially visualized lungs are clear. Superior mediastinum demonstrates no abnormality. Thyroid gland normal. No significant adenopathy. No acute soft tissue abnormality. Patient is intubated. CTA HEAD Anterior circulation: Petrous segments are widely patent bilaterally. Scattered calcified atheromatous plaque within the cavernous ICAs without flow-limiting stenosis. Supraclinoid segments widely patent. A1 segments patent. Previous identified moderate to severe or right A1 and  more mild left a 1 stenosis is stable. Right A1 segment is hypoplastic, which likely accounts for the slightly diminutive right ICA as compared to the left. Anterior communicating artery normal. Partially visualized anterior cerebral arteries patent. M1 segments patent without stenosis or occlusion. MCA bifurcations normal. No proximal M2 branch occlusion or stenosis. Distal MCA branches not well evaluated on this exam due to technical error. Probable distal small vessel atheromatous irregularity suspected within the left MCA branches. Posterior circulation: Vertebral arteries patent to the vertebrobasilar junction. Right vertebral artery dominant. Posterior inferior cerebral arteries patent. Basilar artery patent to its distal aspect. Superior cerebral arteries patent bilaterally. Both posterior cerebral arteries arise from the basilar artery and are well opacified to their distal aspects. Venous sinuses: Not well evaluated on this exam due to timing of the contrast bolus and technical error. Anatomic variants: No anatomic variant.  No aneurysm. Delayed phase: Not performed. IMPRESSION: CTA NECK IMPRESSION: 1. No flow limiting or critical stenosis identified within the major arterial vasculature of the neck. 2. Atheromatous plaque at the proximal ICAs with associated stenoses  of approximately 30% by NASCET criteria bilaterally. CTA HEAD IMPRESSION: 1. No large or proximal arterial branch occlusion within the intracranial circulation. No high-grade or correctable stenosis. 2. Atheromatous plaque throughout the cavernous ICAs with secondary mild to moderate multi focal irregularity and narrowing. 3. Stable moderate to severe right and more mild proximal left ACA stenoses. A page has been put into the neurohospitalist on call at the time of this dictation. Electronically Signed   By: Jeannine Boga M.D.   On: 04/23/2016 04:08   Ct Head Wo Contrast  04/23/2016  CLINICAL DATA:  Code stroke.  Initial encounter. EXAM: CT HEAD WITHOUT CONTRAST TECHNIQUE: Contiguous axial images were obtained from the base of the skull through the vertex without intravenous contrast. COMPARISON:  CT of the head performed 07/27/2015 FINDINGS: There is no evidence of acute infarction, mass lesion, or intra- or extra-axial hemorrhage on CT. Prominence of the ventricles and sulci reflects mild to moderate cortical volume loss. Scattered periventricular and subcortical white matter change likely reflects small vessel ischemic microangiopathy. The brainstem and fourth ventricle are within normal limits. The basal ganglia are unremarkable in appearance. The cerebral hemispheres demonstrate grossly normal gray-white differentiation. No mass effect or midline shift is seen. There is no evidence of fracture; visualized osseous structures are unremarkable in appearance. The orbits are within normal limits. The paranasal sinuses and mastoid air cells are well-aerated. No significant soft tissue abnormalities are seen. IMPRESSION: 1. No acute intracranial pathology seen on CT. 2. Mild to moderate cortical volume loss and scattered small vessel ischemic microangiopathy. These results were called by telephone at the time of interpretation on 04/23/2016 at 2:25 am to Dr. Wendee Beavers, who verbally acknowledged these results.  Electronically Signed   By: Garald Balding M.D.   On: 04/23/2016 02:25   Ct Angio Neck W/cm &/or Wo/cm  04/23/2016  CLINICAL DATA:  Initial evaluation for acute seizure, right gaze deviation. EXAM: CT ANGIOGRAPHY HEAD AND NECK TECHNIQUE: Multidetector CT imaging of the head and neck was performed using the standard protocol during bolus administration of intravenous contrast. Multiplanar CT image reconstructions and MIPs were obtained to evaluate the vascular anatomy. Carotid stenosis measurements (when applicable) are obtained utilizing NASCET criteria, using the distal internal carotid diameter as the denominator. CONTRAST:  80 cc of Isovue 370. COMPARISON:  Prior noncontrast head CT from earlier same day. FINDINGS: Study is limited by a technical error, with  the mid and superior aspect of the the brain not visualized on arterial phase imaging. CTA NECK Aortic arch: Visualized aortic arch of normal caliber with normal 3 vessel morphology. Scattered calcified plaque within the arch itself and about the origin of the great vessels without flow-limiting stenosis. Scattered calcified plaque within the proximal subclavian arteries with mild narrowing. Subclavian arteries otherwise patent. Right carotid system: Right common carotid artery patent from its origin to the bifurcation. Mild plaque about the proximal right ICA with associated narrowing of approximately 30% by NASCET criteria. Probable small penetrating ulcer present at this level as well. Right ICA patent distally without stenosis, occlusion, or dissection. Multifocal atheromatous irregularity within the right ICA without high-grade stenosis. Right external carotid artery and its branches grossly normal. Left carotid system: Left common carotid artery patent from its origin to the bifurcation. Eccentric calcified plaque about the proximal left ICA with associated narrowing of approximately 30% by NASCET criteria. Distally, left ICA patent to the skullbase  without additional stenosis, dissection, or occlusion. Multi focal atheromatous irregularity without high-grade stenosis. Left external carotid artery and its branches within normal limits. Vertebral arteries:Both vertebral arteries arise from the subclavian arteries. Right vertebral artery is dominant. Vertebral arteries patent along their entire course without stenosis, dissection, or occlusion. Skeleton: No acute osseous abnormality. Multilevel degenerative spondylolysis within the cervical spine, most prevalent at C5-6 and C6-7. Well-circumscribed lucent lesion within the C5 vertebral body favored to be benign. Other neck: Partially visualized lungs are clear. Superior mediastinum demonstrates no abnormality. Thyroid gland normal. No significant adenopathy. No acute soft tissue abnormality. Patient is intubated. CTA HEAD Anterior circulation: Petrous segments are widely patent bilaterally. Scattered calcified atheromatous plaque within the cavernous ICAs without flow-limiting stenosis. Supraclinoid segments widely patent. A1 segments patent. Previous identified moderate to severe or right A1 and more mild left a 1 stenosis is stable. Right A1 segment is hypoplastic, which likely accounts for the slightly diminutive right ICA as compared to the left. Anterior communicating artery normal. Partially visualized anterior cerebral arteries patent. M1 segments patent without stenosis or occlusion. MCA bifurcations normal. No proximal M2 branch occlusion or stenosis. Distal MCA branches not well evaluated on this exam due to technical error. Probable distal small vessel atheromatous irregularity suspected within the left MCA branches. Posterior circulation: Vertebral arteries patent to the vertebrobasilar junction. Right vertebral artery dominant. Posterior inferior cerebral arteries patent. Basilar artery patent to its distal aspect. Superior cerebral arteries patent bilaterally. Both posterior cerebral arteries arise  from the basilar artery and are well opacified to their distal aspects. Venous sinuses: Not well evaluated on this exam due to timing of the contrast bolus and technical error. Anatomic variants: No anatomic variant.  No aneurysm. Delayed phase: Not performed. IMPRESSION: CTA NECK IMPRESSION: 1. No flow limiting or critical stenosis identified within the major arterial vasculature of the neck. 2. Atheromatous plaque at the proximal ICAs with associated stenoses of approximately 30% by NASCET criteria bilaterally. CTA HEAD IMPRESSION: 1. No large or proximal arterial branch occlusion within the intracranial circulation. No high-grade or correctable stenosis. 2. Atheromatous plaque throughout the cavernous ICAs with secondary mild to moderate multi focal irregularity and narrowing. 3. Stable moderate to severe right and more mild proximal left ACA stenoses. A page has been put into the neurohospitalist on call at the time of this dictation. Electronically Signed   By: Rise Mu M.D.   On: 04/23/2016 04:08   Mr Brain Wo Contrast  04/23/2016  CLINICAL DATA:  Initial evaluation  for acute seizure, right gaze deviation. EXAM: MRI HEAD WITHOUT CONTRAST TECHNIQUE: Multiplanar, multiecho pulse sequences of the brain and surrounding structures were obtained without intravenous contrast. COMPARISON:  Prior studies from earlier same day as well as previous brain MRI from 07/27/2015. FINDINGS: Diffuse prominence of the CSF containing spaces is compatible with generalized age-related cerebral atrophy, progressed relative to most recent MRI. Confluent T2/FLAIR hyperintensity involving the periventricular and deep greater than subcortical white matter has also progressed relative to prior. Chronic small vessel ischemic changes present within the pons. Numerous dilated perivascular spaces within the basal ganglia, similar to prior. Remote lacunar infarct with chronic hemosiderin staining present within the body of the  right caudate/right internal capsule. Additional small chronic lacunar infarctions within the bilateral thalami. Small chronic micro hemorrhages within the pons as well. Probable additional tiny remote left cerebellar infarct. Mildly hyperintense 9 mm diffusion abnormality present within the right periatrial white matter (series 4, image 26). This likely reflects the sequela of acute/early subacute small vessel ischemia. No other acute infarct. Gray-white matter differentiation maintained. Major intracranial vascular flow voids are preserved. No acute intracranial hemorrhage. The no mass lesion, midline shift, or mass effect. Ventricular prominence related to global parenchymal volume loss of hydrocephalus. Hippocampi are symmetric in size and appearance with normal signal intensity and morphology. No extra-axial fluid collection. Scattered FLAIR sequence seen throughout the supratentorial brain on this exam favored to related to motion artifact. Craniocervical junction normal. Visualized upper cervical spine unremarkable. 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 No acute abnormality about the orbits. Sequela prior lens extraction noted bilaterally. Fluid within the posterior nasopharynx. Patient is intubated. Paranasal sinuses are otherwise clear. Trace opacity within the right mastoid air cells. Mastoid air cells are otherwise clear. Inner ear structures normal. Bone marrow signal intensity normal. No scalp soft tissue abnormality. IMPRESSION: 1. Mildly hyperintense 9 mm diffusion abnormality within the right periatrial white matter, likely reflecting acute/early subacute small vessel ischemia. 2. No other acute intracranial process. 3. Moderate cerebral atrophy with chronic small vessel ischemic disease, progressed relative to most recent MRI from 7/21 07/27/2015. 4. Chronic hemorrhagic right basal ganglia infarct, with additional chronic lacunar infarcts within the thalami, stable. Electronically Signed   By:  Rise Mu M.D.   On: 04/23/2016 05:33   Dg Chest Port 1 View  04/25/2016  CLINICAL DATA:  Ventilator dependent respiratory failure. EXAM: PORTABLE CHEST 1 VIEW COMPARISON:  04/24/2016 and 04/23/2016 FINDINGS: Endotracheal tube, NG tube and central venous catheter appear essentially unchanged in position. The lungs are clear. Pulmonary vascularity is normal. Heart size is normal. Extensive calcification in the aortic arch. No acute bone abnormality. IMPRESSION: No acute abnormalities.  Aortic atherosclerosis. Electronically Signed   By: Francene Boyers M.D.   On: 04/25/2016 07:43   Dg Chest Port 1 View  04/24/2016  CLINICAL DATA:  Respiratory difficulty EXAM: PORTABLE CHEST 1 VIEW COMPARISON:  04/23/2016 FINDINGS: Tubular devices are stable. Patchy bilateral apical pulmonary opacities have increased. Minimal patchy density at the retrocardiac left base. Upper normal heart size. No pneumothorax. Normal vascularity. IMPRESSION: Increased patchy pulmonary opacities at both lung apices and left base. Considerations include patchy volume loss or patchy airspace disease. Electronically Signed   By: Jolaine Click M.D.   On: 04/24/2016 07:34   Dg Chest Port 1 View  04/23/2016  CLINICAL DATA:  Central line placement. EXAM: PORTABLE CHEST 1 VIEW COMPARISON:  04/23/2016 FINDINGS: Endotracheal tube is 6 cm above the carina. Left central line tip in the  SVC. No pneumothorax. NG tube tip in the stomach. Heart is normal size. No confluent airspace opacities or effusions. No acute bony abnormality. IMPRESSION: Support devices as above.  No pneumothorax.  No acute findings. Electronically Signed   By: Rolm Baptise M.D.   On: 04/23/2016 13:00   Dg Chest Portable 1 View  04/23/2016  CLINICAL DATA:  Endotracheal tube and orogastric tube placement. Initial encounter. EXAM: PORTABLE CHEST 1 VIEW COMPARISON:  Chest radiograph performed 07/26/2015 FINDINGS: The patient's endotracheal tube is seen ending 1 cm above the  carina. This could be retracted 2 cm. The patient's enteric tube is seen extending below the diaphragm. The lungs appear relatively clear. No pleural effusion or pneumothorax is seen. The cardiomediastinal silhouette is normal in size. No acute osseous abnormalities are identified. IMPRESSION: 1. Endotracheal tube seen ending 1 cm above the carina. This could be retracted 2 cm. 2. Enteric tube noted extending below the diaphragm. 3. Lungs clear bilaterally. These results were called by telephone at the time of interpretation on 04/23/2016 at 2:17 am to Dr. Delora Fuel, who verbally acknowledged these results. Electronically Signed   By: Garald Balding M.D.   On: 04/23/2016 02:17    Time Spent in minutes  30   SINGH,PRASHANT K M.D on 04/28/2016 at 10:46 AM  Between 7am to 7pm - Pager - [phone]  After 7pm go to [email or web address] - password [facility]  [facility] -  Office  [phone]

## 2016-04-28 NOTE — Progress Notes (Addendum)
Pt found on the floor at 4.40pm, pt's daughter was helping her to the bathroom, incident happened during pt getting out of the bathroom, according to them pt found herself weak and just sat down on the floor, bed side alarm was on since morning but when daughter and Son were inside the room, they refused bed alarm and said since "they are there it is not needed" and they were instructed to call staff to help to the bathroom and  will let nurse know before leaving the room, but it happened while helping them to the patient to the bathroom.Patient daughter felt sorry and said "I will take the responsibility" informed to MD, vitals stable, no any complain of pain or anything, will continue to monitor for any changes in patient's status.

## 2016-04-28 NOTE — Progress Notes (Addendum)
After the fall incident, Pt made ambulated in a hallway with the help of Gaitbelt and walker just to monitor there is no any fracture or pain,  no any pain, SOB and Distress noted, pt tolerated well, will continue to monitor the patient.

## 2016-04-28 NOTE — Progress Notes (Signed)
Foley taken out, Left IJ triple lumen discontinued, pt ambulated to the bathroom, and did fine, no any SOB and distress noted, will continue to monitor the patient.

## 2016-04-29 LAB — GLUCOSE, CAPILLARY
Glucose-Capillary: 167 mg/dL — ABNORMAL HIGH (ref 65–99)
Glucose-Capillary: 341 mg/dL — ABNORMAL HIGH (ref 65–99)
Glucose-Capillary: 273 mg/dL — ABNORMAL HIGH (ref 65–99)

## 2016-04-29 MED ORDER — AMLODIPINE BESYLATE 10 MG PO TABS
10.0000 mg | ORAL_TABLET | Freq: Every day | ORAL | Status: DC
  Administered 2016-04-29 – 2016-05-01 (×3): 10 mg via ORAL
  Filled 2016-04-29 (×3): qty 1

## 2016-04-29 MED ORDER — INSULIN GLARGINE 100 UNIT/ML ~~LOC~~ SOLN
25.0000 [IU] | Freq: Every day | SUBCUTANEOUS | Status: DC
  Administered 2016-04-30 – 2016-05-01 (×2): 25 [IU] via SUBCUTANEOUS
  Filled 2016-04-29 (×2): qty 0.25

## 2016-04-29 NOTE — Progress Notes (Signed)
Physical Therapy Treatment Patient Details Name: Dana GlossBarbara J Blair MRN: [id number] DOB: 02-18-1941 Today's Date: 04/29/2016    History of Present Illness Pt admitted with seizure in the setting of hyperglycemia. New onset afib. Required intubation 4/24-4/27. PMH: DM, CVA, HTN, CKD, syncope and glaucoma.    PT Comments    Dana Blair is progressing well w/ therapy but continues to require min assist for safe transfers and ambulation.  Additionally, she continues to present w/ cognitive impairments. SNF recommendation remains most appropriate at this time.   Follow Up Recommendations  SNF     Equipment Recommendations  None recommended by PT    Recommendations for Other Services       Precautions / Restrictions Precautions Precautions: Fall Precaution Comments: fall this admission 4/29 Restrictions Weight Bearing Restrictions: No    Mobility  Bed Mobility Overal bed mobility: Needs Assistance;+2 for physical assistance Bed Mobility: Supine to Sit     Supine to sit: Min guard;HOB elevated     General bed mobility comments: Increased time w/ HOB elevated   Transfers Overall transfer level: Needs assistance Equipment used: Rolling walker (2 wheeled) Transfers: Sit to/from Stand Sit to Stand: Min assist         General transfer comment: Cues for hand placement and pt slow to rise w/ flexed posture.  Min assist to steady.  Ambulation/Gait Ambulation/Gait assistance: Min assist Ambulation Distance (Feet): 80 Feet Assistive device: Rolling walker (2 wheeled) Gait Pattern/deviations: Step-through pattern;Decreased stride length;Trunk flexed   Gait velocity interpretation: <1.8 ft/sec, indicative of risk for recurrent falls General Gait Details: Very decreased gait speed and requires mod verbal cues to remain on task while ambulating.  Min assist managing RW at times.  Flexed posture.   Stairs            Wheelchair Mobility    Modified Rankin (Stroke  Patients Only)       Balance Overall balance assessment: Needs assistance Sitting-balance support: Feet supported;Single extremity supported Sitting balance-Leahy Scale: Poor Sitting balance - Comments: 1 UE supported on bed for balance   Standing balance support: Bilateral upper extremity supported;During functional activity Standing balance-Leahy Scale: Poor Standing balance comment: Relies on RW for support                    Cognition Arousal/Alertness: Awake/alert Behavior During Therapy: Flat affect Overall Cognitive Status: Impaired/Different from baseline Area of Impairment: Orientation;Attention;Memory;Following commands;Safety/judgement;Problem solving Orientation Level: Disoriented to;Time;Situation;Place Current Attention Level: Sustained Memory: Decreased short-term memory Following Commands: Follows one step commands inconsistently;Follows one step commands with increased time (with multimodal cues at times) Safety/Judgement: Decreased awareness of safety;Decreased awareness of deficits   Problem Solving: Slow processing;Decreased initiation;Requires verbal cues;Difficulty sequencing;Requires tactile cues General Comments: When asked where she was she continued to repeat "April..."  She required mod verbal cues to remain on task while ambulating as she continued to look around her as if looking for something.    Exercises General Exercises - Lower Extremity Ankle Circles/Pumps: AROM;Both;10 reps;Seated Long Arc Quad: AROM;Both;10 reps;Seated Hip Flexion/Marching: AROM;Both;10 reps;Seated    General Comments        Pertinent Vitals/Pain Pain Assessment: No/denies pain    Home Living                      Prior Function            PT Goals (current goals can now be found in the care plan section) Acute Rehab PT Goals Patient Stated  Goal: to sit in the chair PT Goal Formulation: Patient unable to participate in goal setting Time For Goal  Achievement: 05/10/16 Potential to Achieve Goals: Good Progress towards PT goals: Progressing toward goals    Frequency  Min 2X/week    PT Plan Current plan remains appropriate    Co-evaluation             End of Session Equipment Utilized During Treatment: Gait belt Activity Tolerance: Patient limited by fatigue Patient left: with call bell/phone within reach;in chair;with chair alarm set (Bed in chair position)     Time: 4782-9562 PT Time Calculation (min) (ACUTE ONLY): 27 min  Charges:  $Gait Training: 8-22 mins $Therapeutic Exercise: 8-22 mins                    G Codes:      Encarnacion Chu PT, DPT  Pager: [phone] Phone: [phone] 04/29/2016, 10:33 AM

## 2016-04-29 NOTE — Progress Notes (Addendum)
PROGRESS NOTE                                                                                                                                                                                                             Patient Demographics:    Dana Blair, is a 75 y.o. female, DOB - 1941-11-26, WUG:[id number]  Admit date - 04/23/2016   Admitting Physician Dannielle Burn, MD  Outpatient Primary MD for the patient is Lynne Logan, MD  LOS - 6  Outpatient Specialists:   Chief Complaint  Patient presents with  . Code Stroke       Brief Narrative   75 year old African-American female with previous history of ischemic stroke, DM type II, essential hypertension, dyslipidemia who was admitted by pulmonary critical care on 04/23/2016 with status epilepticus. She initially required intubation to protect her airway, she was seen by neurology, was found to have incidental new ischemic stroke, she has now been transitioned to [facility] and is seizure free. Stabilized and transferred to the floor under my care on 04/28/2016 day 5 of her hospital stay.   Subjective:    [name] today has, No headache, No chest pain, No abdominal pain - No Nausea, No new weakness tingling or numbness, No Cough - SOB.    Assessment  & Plan :     1.Status epilepticus. Resolved. Initial intubation for airway protection, now stable, neuro on board, per neuro continue Keppra. Increase activity and PT eval. Will need placement.  2. History of multiple strokes in the past, incidental finding of new ischemic stroke on MRI. Neuro has seen the patient, full stroke protocol has been followed, currently on aspirin and statin to be continued. Outpatient neuro follow-up. No further stroke workup per neurology. Intracranial atherosclerosis noted on CT angiogram. Secondary prevention per neurology.  3. Dysphagia. Due to combination of #1 and 2. Speech  therapy following. Currently on dysphagia 2 diet.  4. DM type II with DKA upon admission. In poor control A1c greater than 10, placed on Lantus along with sliding scale, diabetic education ordered. We will adjust Lantus dose further for better control.  CBG (last 3)   Recent Labs  04/28/16 1606 04/28/16 2045 04/29/16 0623  GLUCAP 260* 232* 167*     5. Dyslipidemia. On statin continue present dose.  6. GERD. On H2 blocker continue.  7. Escherichia coli UTI. Discontinue Foley catheter, Rocephin 3 days. Clinically resolved.  8. Foley catheter placement and central line placement in the ER. We'll discontinue on 04/28/2016.  9. Hypertension. On beta blocker continue, add Norvasc for better control.    Code Status : Full  Family Communication  : Daughter - Berklie Dethlefs - [place]  phone # [phone] on 04/29/2016  Disposition Plan  : SNF versus home health PT  Barriers For Discharge : PT evaluation, placement  Consults  :  PCCM, Neuro  Procedures  :   CT head, CT angiogram head and neck. MRI brain. Small acute ischemic infarct, intracranial diffuse atherosclerosis.  EEG -   1) Possible focal slowing on the left with loss of overriding faster frequencies 2) Diffuse slowing.  CLINICAL CORRELATION: This EEG is consistent with diffuse non-specific cerebral dysfunction, with suggestion of focal neuronal dysfunction on the left. No electrographic seizures were seen  4/24 ETT >> 4/27 4/24 OGT  4/24 Foley >> 04-28-16 4/24 L IJ TLC>>> 04-28-16  DVT Prophylaxis  :  Lovenox    Lab Results  Component Value Date   PLT 227 04/27/2016    Antibiotics  :    Anti-infectives    Start     Dose/Rate Route Frequency Ordered Stop   04/29/16 1045  cefTRIAXone (ROCEPHIN) 1 g in dextrose 5 % 50 mL IVPB     1 g 100 mL/hr over 30 Minutes Intravenous Every 24 hours 04/28/16 1056 04/30/16 1044   04/25/16 1045  cefTRIAXone (ROCEPHIN) 1 g in dextrose 5 % 50 mL IVPB  Status:   Discontinued     1 g 100 mL/hr over 30 Minutes Intravenous Every 24 hours 04/25/16 1031 04/28/16 1056        Objective:   Filed Vitals:   04/28/16 2053 04/29/16 0024 04/29/16 0100 04/29/16 0400  BP: 158/66  148/66 163/68  Pulse: 79 71 68 78  Temp: 98.5 F (36.9 C) 99.1 F (37.3 C)  98.8 F (37.1 C)  TempSrc: Oral Oral  Oral  Resp: '18 16  16  '$ Height:      Weight:    64.093 kg (141 lb 4.8 oz)  SpO2: 100% 100%  100%    Wt Readings from Last 3 Encounters:  04/29/16 64.093 kg (141 lb 4.8 oz)  09/29/15 51.71 kg (114 lb)  08/02/15 53.479 kg (117 lb 14.4 oz)     Intake/Output Summary (Last 24 hours) at 04/29/16 1033 Last data filed at 04/29/16 1018  Gross per 24 hour  Intake    840 ml  Output   1426 ml  Net   -586 ml     Physical Exam  Awake, Pleasantly confused, No new F.N deficits, Normal affect [place].AT,PERRAL Supple Neck,No JVD, No cervical lymphadenopathy appriciated.  Symmetrical Chest wall movement, Good air movement bilaterally, CTAB RRR,No Gallops,Rubs or new Murmurs, No Parasternal Heave +ve B.Sounds, Abd Soft, No tenderness, No organomegaly appriciated, No rebound - guarding or rigidity. No Cyanosis, Clubbing or edema, No new Rash or bruise      Data Review:    CBC  Recent Labs Lab 04/23/16 0150  04/23/16 0853 04/24/16 0335 04/25/16 0424 04/26/16 0433 04/27/16 0404  WBC 10.7*  --  12.3* 14.5* 11.8* 10.2 7.7  HGB 15.0  < > 14.2 12.6 11.8* 11.4* 10.8*  HCT 46.3*  < > 44.6 37.8 36.5 35.1* 34.0*  PLT 260  --  244 223 193 208 227  MCV 84.2  --  83.1 80.1 80.9 81.3  83.5  MCH 27.3  --  26.4 26.7 26.2 26.4 26.5  MCHC 32.4  --  31.8 33.3 32.3 32.5 31.8  RDW 13.2  --  13.4 13.6 14.0 14.4 14.7  LYMPHSABS 2.3  --   --   --   --   --   --   MONOABS 0.5  --   --   --   --   --   --   EOSABS 0.0  --   --   --   --   --   --   BASOSABS 0.0  --   --   --   --   --   --   < > = values in this interval not displayed.  Chemistries   Recent Labs Lab  04/23/16 0150  04/23/16 0853 04/24/16 0335 04/25/16 0424 04/26/16 0433 04/27/16 0404  NA 135  < > 145 141 139 141 144  K 4.2  < > 4.2 2.9* 3.2* 3.6 4.1  CL 95*  < > 106 107 107 110 113*  CO2 19*  --  '26 23 22 23 24  '$ GLUCOSE 670*  < > 276* 187* 244* 172* 107*  BUN 21*  < > 18 28* 18 26* 24*  CREATININE 1.39*  < > 1.23* 1.28* 0.84 0.84 0.87  CALCIUM 9.4  --  8.9 8.9 8.6* 8.5* 8.6*  MG  --   --  1.9 2.0 1.8 2.4  --   AST 42*  --   --   --   --   --   --   ALT 30  --   --   --   --   --   --   ALKPHOS 126  --   --   --   --   --   --   BILITOT 0.7  --   --   --   --   --   --   < > = values in this interval not displayed. ------------------------------------------------------------------------------------------------------------------ No results for input(s): CHOL, HDL, LDLCALC, TRIG, CHOLHDL, LDLDIRECT in the last 72 hours.  Lab Results  Component Value Date   HGBA1C 9.6* 04/24/2016   CBG (last 3)   Recent Labs  04/28/16 1606 04/28/16 2045 04/29/16 0623  GLUCAP 260* 232* 167*   ------------------------------------------------------------------------------------------------------------------ No results for input(s): TSH, T4TOTAL, T3FREE, THYROIDAB in the last 72 hours.  Invalid input(s): FREET3 ------------------------------------------------------------------------------------------------------------------ No results for input(s): VITAMINB12, FOLATE, FERRITIN, TIBC, IRON, RETICCTPCT in the last 72 hours.  Coagulation profile  Recent Labs Lab 04/23/16 0150  INR 0.87    No results for input(s): DDIMER in the last 72 hours.  Cardiac Enzymes No results for input(s): CKMB, TROPONINI, MYOGLOBIN in the last 168 hours.  Invalid input(s): CK ------------------------------------------------------------------------------------------------------------------ No results found for: BNP  Inpatient Medications  Scheduled Meds: . antiseptic oral rinse  7 mL Mouth Rinse QID   . aspirin  324 mg Oral Daily  . atorvastatin  20 mg Oral q1800  . cefTRIAXone (ROCEPHIN)  IV  1 g Intravenous Q24H  . chlorhexidine gluconate (SAGE KIT)  15 mL Mouth Rinse BID  . enoxaparin (LOVENOX) injection  30 mg Subcutaneous Q24H  . famotidine  20 mg Per Tube BID  . insulin aspart  0-5 Units Subcutaneous QHS  . insulin aspart  0-9 Units Subcutaneous TID WC  . insulin glargine  15 Units Subcutaneous Daily  . labetalol  100 mg Oral TID  . levETIRAcetam  1,000 mg Oral Q12H  .  sodium chloride flush  10-40 mL Intracatheter Q12H   Continuous Infusions: . sodium chloride 20 mL/hr at 04/27/16 1700   PRN Meds:.sodium chloride, fentaNYL (SUBLIMAZE) injection, hydrALAZINE, labetalol, sodium chloride flush  Micro Results Recent Results (from the past 240 hour(s))  Urine culture     Status: Abnormal   Collection Time: 04/23/16  3:28 AM  Result Value Ref Range Status   Specimen Description URINE, CATHETERIZED  Final   Special Requests NONE  Final   Culture >=100,000 COLONIES/mL ESCHERICHIA COLI (A)  Final   Report Status 04/25/2016 FINAL  Final   Organism ID, Bacteria ESCHERICHIA COLI (A)  Final      Susceptibility   Escherichia coli - MIC*    AMPICILLIN <=2 SENSITIVE Sensitive     CEFAZOLIN <=4 SENSITIVE Sensitive     CEFTRIAXONE <=1 SENSITIVE Sensitive     CIPROFLOXACIN <=0.25 SENSITIVE Sensitive     GENTAMICIN <=1 SENSITIVE Sensitive     IMIPENEM <=0.25 SENSITIVE Sensitive     NITROFURANTOIN <=16 SENSITIVE Sensitive     TRIMETH/SULFA <=20 SENSITIVE Sensitive     AMPICILLIN/SULBACTAM <=2 SENSITIVE Sensitive     PIP/TAZO <=4 SENSITIVE Sensitive     * >=100,000 COLONIES/mL ESCHERICHIA COLI  MRSA PCR Screening     Status: None   Collection Time: 04/23/16  5:23 AM  Result Value Ref Range Status   MRSA by PCR NEGATIVE NEGATIVE Final    Comment:        The GeneXpert MRSA Assay (FDA approved for NASAL specimens only), is one component of a comprehensive MRSA  colonization surveillance program. It is not intended to diagnose MRSA infection nor to guide or monitor treatment for MRSA infections.     Radiology Reports Ct Angio Head W/cm &/or Wo Cm  04/23/2016  CLINICAL DATA:  Initial evaluation for acute seizure, right gaze deviation. EXAM: CT ANGIOGRAPHY HEAD AND NECK TECHNIQUE: Multidetector CT imaging of the head and neck was performed using the standard protocol during bolus administration of intravenous contrast. Multiplanar CT image reconstructions and MIPs were obtained to evaluate the vascular anatomy. Carotid stenosis measurements (when applicable) are obtained utilizing NASCET criteria, using the distal internal carotid diameter as the denominator. CONTRAST:  80 cc of Isovue 370. COMPARISON:  Prior noncontrast head CT from earlier same day. FINDINGS: Study is limited by a technical error, with the mid and superior aspect of the the brain not visualized on arterial phase imaging. CTA NECK Aortic arch: Visualized aortic arch of normal caliber with normal 3 vessel morphology. Scattered calcified plaque within the arch itself and about the origin of the great vessels without flow-limiting stenosis. Scattered calcified plaque within the proximal subclavian arteries with mild narrowing. Subclavian arteries otherwise patent. Right carotid system: Right common carotid artery patent from its origin to the bifurcation. Mild plaque about the proximal right ICA with associated narrowing of approximately 30% by NASCET criteria. Probable small penetrating ulcer present at this level as well. Right ICA patent distally without stenosis, occlusion, or dissection. Multifocal atheromatous irregularity within the right ICA without high-grade stenosis. Right external carotid artery and its branches grossly normal. Left carotid system: Left common carotid artery patent from its origin to the bifurcation. Eccentric calcified plaque about the proximal left ICA with associated  narrowing of approximately 30% by NASCET criteria. Distally, left ICA patent to the skullbase without additional stenosis, dissection, or occlusion. Multi focal atheromatous irregularity without high-grade stenosis. Left external carotid artery and its branches within normal limits. Vertebral arteries:Both vertebral arteries arise  from the subclavian arteries. Right vertebral artery is dominant. Vertebral arteries patent along their entire course without stenosis, dissection, or occlusion. Skeleton: No acute osseous abnormality. Multilevel degenerative spondylolysis within the cervical spine, most prevalent at C5-6 and C6-7. Well-circumscribed lucent lesion within the C5 vertebral body favored to be benign. Other neck: Partially visualized lungs are clear. Superior mediastinum demonstrates no abnormality. Thyroid gland normal. No significant adenopathy. No acute soft tissue abnormality. Patient is intubated. CTA HEAD Anterior circulation: Petrous segments are widely patent bilaterally. Scattered calcified atheromatous plaque within the cavernous ICAs without flow-limiting stenosis. Supraclinoid segments widely patent. A1 segments patent. Previous identified moderate to severe or right A1 and more mild left a 1 stenosis is stable. Right A1 segment is hypoplastic, which likely accounts for the slightly diminutive right ICA as compared to the left. Anterior communicating artery normal. Partially visualized anterior cerebral arteries patent. M1 segments patent without stenosis or occlusion. MCA bifurcations normal. No proximal M2 branch occlusion or stenosis. Distal MCA branches not well evaluated on this exam due to technical error. Probable distal small vessel atheromatous irregularity suspected within the left MCA branches. Posterior circulation: Vertebral arteries patent to the vertebrobasilar junction. Right vertebral artery dominant. Posterior inferior cerebral arteries patent. Basilar artery patent to its distal  aspect. Superior cerebral arteries patent bilaterally. Both posterior cerebral arteries arise from the basilar artery and are well opacified to their distal aspects. Venous sinuses: Not well evaluated on this exam due to timing of the contrast bolus and technical error. Anatomic variants: No anatomic variant.  No aneurysm. Delayed phase: Not performed. IMPRESSION: CTA NECK IMPRESSION: 1. No flow limiting or critical stenosis identified within the major arterial vasculature of the neck. 2. Atheromatous plaque at the proximal ICAs with associated stenoses of approximately 30% by NASCET criteria bilaterally. CTA HEAD IMPRESSION: 1. No large or proximal arterial branch occlusion within the intracranial circulation. No high-grade or correctable stenosis. 2. Atheromatous plaque throughout the cavernous ICAs with secondary mild to moderate multi focal irregularity and narrowing. 3. Stable moderate to severe right and more mild proximal left ACA stenoses. A page has been put into the neurohospitalist on call at the time of this dictation. Electronically Signed   By: Jeannine Boga M.D.   On: 04/23/2016 04:08   Ct Head Wo Contrast  04/23/2016  CLINICAL DATA:  Code stroke.  Initial encounter. EXAM: CT HEAD WITHOUT CONTRAST TECHNIQUE: Contiguous axial images were obtained from the base of the skull through the vertex without intravenous contrast. COMPARISON:  CT of the head performed 07/27/2015 FINDINGS: There is no evidence of acute infarction, mass lesion, or intra- or extra-axial hemorrhage on CT. Prominence of the ventricles and sulci reflects mild to moderate cortical volume loss. Scattered periventricular and subcortical white matter change likely reflects small vessel ischemic microangiopathy. The brainstem and fourth ventricle are within normal limits. The basal ganglia are unremarkable in appearance. The cerebral hemispheres demonstrate grossly normal gray-white differentiation. No mass effect or midline  shift is seen. There is no evidence of fracture; visualized osseous structures are unremarkable in appearance. The orbits are within normal limits. The paranasal sinuses and mastoid air cells are well-aerated. No significant soft tissue abnormalities are seen. IMPRESSION: 1. No acute intracranial pathology seen on CT. 2. Mild to moderate cortical volume loss and scattered small vessel ischemic microangiopathy. These results were called by telephone at the time of interpretation on 04/23/2016 at 2:25 am to Dr. Wendee Beavers, who verbally acknowledged these results. Electronically Signed   By: Jacqulynn Cadet  Chang M.D.   On: 04/23/2016 02:25   Ct Angio Neck W/cm &/or Wo/cm  04/23/2016  CLINICAL DATA:  Initial evaluation for acute seizure, right gaze deviation. EXAM: CT ANGIOGRAPHY HEAD AND NECK TECHNIQUE: Multidetector CT imaging of the head and neck was performed using the standard protocol during bolus administration of intravenous contrast. Multiplanar CT image reconstructions and MIPs were obtained to evaluate the vascular anatomy. Carotid stenosis measurements (when applicable) are obtained utilizing NASCET criteria, using the distal internal carotid diameter as the denominator. CONTRAST:  80 cc of Isovue 370. COMPARISON:  Prior noncontrast head CT from earlier same day. FINDINGS: Study is limited by a technical error, with the mid and superior aspect of the the brain not visualized on arterial phase imaging. CTA NECK Aortic arch: Visualized aortic arch of normal caliber with normal 3 vessel morphology. Scattered calcified plaque within the arch itself and about the origin of the great vessels without flow-limiting stenosis. Scattered calcified plaque within the proximal subclavian arteries with mild narrowing. Subclavian arteries otherwise patent. Right carotid system: Right common carotid artery patent from its origin to the bifurcation. Mild plaque about the proximal right ICA with associated narrowing of approximately 30%  by NASCET criteria. Probable small penetrating ulcer present at this level as well. Right ICA patent distally without stenosis, occlusion, or dissection. Multifocal atheromatous irregularity within the right ICA without high-grade stenosis. Right external carotid artery and its branches grossly normal. Left carotid system: Left common carotid artery patent from its origin to the bifurcation. Eccentric calcified plaque about the proximal left ICA with associated narrowing of approximately 30% by NASCET criteria. Distally, left ICA patent to the skullbase without additional stenosis, dissection, or occlusion. Multi focal atheromatous irregularity without high-grade stenosis. Left external carotid artery and its branches within normal limits. Vertebral arteries:Both vertebral arteries arise from the subclavian arteries. Right vertebral artery is dominant. Vertebral arteries patent along their entire course without stenosis, dissection, or occlusion. Skeleton: No acute osseous abnormality. Multilevel degenerative spondylolysis within the cervical spine, most prevalent at C5-6 and C6-7. Well-circumscribed lucent lesion within the C5 vertebral body favored to be benign. Other neck: Partially visualized lungs are clear. Superior mediastinum demonstrates no abnormality. Thyroid gland normal. No significant adenopathy. No acute soft tissue abnormality. Patient is intubated. CTA HEAD Anterior circulation: Petrous segments are widely patent bilaterally. Scattered calcified atheromatous plaque within the cavernous ICAs without flow-limiting stenosis. Supraclinoid segments widely patent. A1 segments patent. Previous identified moderate to severe or right A1 and more mild left a 1 stenosis is stable. Right A1 segment is hypoplastic, which likely accounts for the slightly diminutive right ICA as compared to the left. Anterior communicating artery normal. Partially visualized anterior cerebral arteries patent. M1 segments patent  without stenosis or occlusion. MCA bifurcations normal. No proximal M2 branch occlusion or stenosis. Distal MCA branches not well evaluated on this exam due to technical error. Probable distal small vessel atheromatous irregularity suspected within the left MCA branches. Posterior circulation: Vertebral arteries patent to the vertebrobasilar junction. Right vertebral artery dominant. Posterior inferior cerebral arteries patent. Basilar artery patent to its distal aspect. Superior cerebral arteries patent bilaterally. Both posterior cerebral arteries arise from the basilar artery and are well opacified to their distal aspects. Venous sinuses: Not well evaluated on this exam due to timing of the contrast bolus and technical error. Anatomic variants: No anatomic variant.  No aneurysm. Delayed phase: Not performed. IMPRESSION: CTA NECK IMPRESSION: 1. No flow limiting or critical stenosis identified within the major arterial vasculature  of the neck. 2. Atheromatous plaque at the proximal ICAs with associated stenoses of approximately 30% by NASCET criteria bilaterally. CTA HEAD IMPRESSION: 1. No large or proximal arterial branch occlusion within the intracranial circulation. No high-grade or correctable stenosis. 2. Atheromatous plaque throughout the cavernous ICAs with secondary mild to moderate multi focal irregularity and narrowing. 3. Stable moderate to severe right and more mild proximal left ACA stenoses. A page has been put into the neurohospitalist on call at the time of this dictation. Electronically Signed   By: Jeannine Boga M.D.   On: 04/23/2016 04:08   Mr Brain Wo Contrast  04/23/2016  CLINICAL DATA:  Initial evaluation for acute seizure, right gaze deviation. EXAM: MRI HEAD WITHOUT CONTRAST TECHNIQUE: Multiplanar, multiecho pulse sequences of the brain and surrounding structures were obtained without intravenous contrast. COMPARISON:  Prior studies from earlier same day as well as previous brain  MRI from 07/27/2015. FINDINGS: Diffuse prominence of the CSF containing spaces is compatible with generalized age-related cerebral atrophy, progressed relative to most recent MRI. Confluent T2/FLAIR hyperintensity involving the periventricular and deep greater than subcortical white matter has also progressed relative to prior. Chronic small vessel ischemic changes present within the pons. Numerous dilated perivascular spaces within the basal ganglia, similar to prior. Remote lacunar infarct with chronic hemosiderin staining present within the body of the right caudate/right internal capsule. Additional small chronic lacunar infarctions within the bilateral thalami. Small chronic micro hemorrhages within the pons as well. Probable additional tiny remote left cerebellar infarct. Mildly hyperintense 9 mm diffusion abnormality present within the right periatrial white matter (series 4, image 26). This likely reflects the sequela of acute/early subacute small vessel ischemia. No other acute infarct. Gray-white matter differentiation maintained. Major intracranial vascular flow voids are preserved. No acute intracranial hemorrhage. The no mass lesion, midline shift, or mass effect. Ventricular prominence related to global parenchymal volume loss of hydrocephalus. Hippocampi are symmetric in size and appearance with normal signal intensity and morphology. No extra-axial fluid collection. Scattered FLAIR sequence seen throughout the supratentorial brain on this exam favored to related to motion artifact. Craniocervical junction normal. Visualized upper cervical spine unremarkable. 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 No acute abnormality about the orbits. Sequela prior lens extraction noted bilaterally. Fluid within the posterior nasopharynx. Patient is intubated. Paranasal sinuses are otherwise clear. Trace opacity within the right mastoid air cells. Mastoid air cells are otherwise clear. Inner ear structures normal.  Bone marrow signal intensity normal. No scalp soft tissue abnormality. IMPRESSION: 1. Mildly hyperintense 9 mm diffusion abnormality within the right periatrial white matter, likely reflecting acute/early subacute small vessel ischemia. 2. No other acute intracranial process. 3. Moderate cerebral atrophy with chronic small vessel ischemic disease, progressed relative to most recent MRI from 7/21 07/27/2015. 4. Chronic hemorrhagic right basal ganglia infarct, with additional chronic lacunar infarcts within the thalami, stable. Electronically Signed   By: Jeannine Boga M.D.   On: 04/23/2016 05:33   Dg Chest Port 1 View  04/25/2016  CLINICAL DATA:  Ventilator dependent respiratory failure. EXAM: PORTABLE CHEST 1 VIEW COMPARISON:  04/24/2016 and 04/23/2016 FINDINGS: Endotracheal tube, NG tube and central venous catheter appear essentially unchanged in position. The lungs are clear. Pulmonary vascularity is normal. Heart size is normal. Extensive calcification in the aortic arch. No acute bone abnormality. IMPRESSION: No acute abnormalities.  Aortic atherosclerosis. Electronically Signed   By: Lorriane Shire M.D.   On: 04/25/2016 07:43   Dg Chest Port 1 View  04/24/2016  CLINICAL  DATA:  Respiratory difficulty EXAM: PORTABLE CHEST 1 VIEW COMPARISON:  04/23/2016 FINDINGS: Tubular devices are stable. Patchy bilateral apical pulmonary opacities have increased. Minimal patchy density at the retrocardiac left base. Upper normal heart size. No pneumothorax. Normal vascularity. IMPRESSION: Increased patchy pulmonary opacities at both lung apices and left base. Considerations include patchy volume loss or patchy airspace disease. Electronically Signed   By: Marybelle Killings M.D.   On: 04/24/2016 07:34   Dg Chest Port 1 View  04/23/2016  CLINICAL DATA:  Central line placement. EXAM: PORTABLE CHEST 1 VIEW COMPARISON:  04/23/2016 FINDINGS: Endotracheal tube is 6 cm above the carina. Left central line tip in the SVC. No  pneumothorax. NG tube tip in the stomach. Heart is normal size. No confluent airspace opacities or effusions. No acute bony abnormality. IMPRESSION: Support devices as above.  No pneumothorax.  No acute findings. Electronically Signed   By: Rolm Baptise M.D.   On: 04/23/2016 13:00   Dg Chest Portable 1 View  04/23/2016  CLINICAL DATA:  Endotracheal tube and orogastric tube placement. Initial encounter. EXAM: PORTABLE CHEST 1 VIEW COMPARISON:  Chest radiograph performed 07/26/2015 FINDINGS: The patient's endotracheal tube is seen ending 1 cm above the carina. This could be retracted 2 cm. The patient's enteric tube is seen extending below the diaphragm. The lungs appear relatively clear. No pleural effusion or pneumothorax is seen. The cardiomediastinal silhouette is normal in size. No acute osseous abnormalities are identified. IMPRESSION: 1. Endotracheal tube seen ending 1 cm above the carina. This could be retracted 2 cm. 2. Enteric tube noted extending below the diaphragm. 3. Lungs clear bilaterally. These results were called by telephone at the time of interpretation on 04/23/2016 at 2:17 am to Dr. Delora Fuel, who verbally acknowledged these results. Electronically Signed   By: Garald Balding M.D.   On: 04/23/2016 02:17    Time Spent in minutes  30   Alexsia Klindt K M.D on 04/29/2016 at 10:33 AM  Between 7am to 7pm - Pager - [phone]  After 7pm go to [email or web address] - password [facility]  [facility] -  Office  [phone]

## 2016-04-29 NOTE — Progress Notes (Signed)
MD, pt's daughter Pearletha Furl(Jina Saathoff-Lipscone) wanted to discuss the pt's care with u, her phone # [phone], thanks Lavonda JumboMike F [phone]

## 2016-04-30 DIAGNOSIS — I10 Essential (primary) hypertension: Secondary | ICD-10-CM

## 2016-04-30 DIAGNOSIS — I638 Other cerebral infarction: Secondary | ICD-10-CM

## 2016-04-30 LAB — GLUCOSE, CAPILLARY
GLUCOSE-CAPILLARY: 148 mg/dL — AB (ref 65–99)
GLUCOSE-CAPILLARY: 164 mg/dL — AB (ref 65–99)
Glucose-Capillary: 178 mg/dL — ABNORMAL HIGH (ref 65–99)
Glucose-Capillary: 309 mg/dL — ABNORMAL HIGH (ref 65–99)
Glucose-Capillary: 166 mg/dL — ABNORMAL HIGH (ref 65–99)

## 2016-04-30 LAB — C DIFFICILE QUICK SCREEN W PCR REFLEX
C DIFFICLE (CDIFF) ANTIGEN: POSITIVE — AB
C Diff toxin: NEGATIVE

## 2016-04-30 NOTE — Progress Notes (Addendum)
[facility]                                                                              Patient Demographics  Dana Blair, is a 75 y.o. female, DOB - 01/06/1941, ZHG:[id number]  Admit date - 04/23/2016   Admitting Physician Dannielle Burn, MD  Outpatient Primary MD for the patient is Lynne Logan, MD  Outpatient specialists: Cardiology, Dr. Marlou Porch   LOS - 7  days    Chief Complaint  Patient presents with  . Code Stroke       Brief summary  75 year old African-American female with previous history of ischemic stroke, DM type II, essential hypertension, dyslipidemia who was admitted by pulmonary critical care on 04/23/2016 with status epilepticus. She initially required intubation to protect her airway, she was seen by neurology, was found to have incidental new ischemic stroke, she has now been transitioned to [place] and is seizure free. Stabilized and transferred to the floor on 04/28/2016.     Assessment & Plan   Status epilepticus. Resolved - Patient was intubated for airway protection, admitted to ICU, currently extubated and stable - Neurology was consulted, patient was placed on Keppra, no further seizures. - continue Keppra 1 g twice a day. - MRI brain showed mildly hyperintense 9 mm diffusion abnormality in the right periatrial white matter acute/early subacute small vessel ischemia, no other acute intracranial process. - PT  recommended skilled nursing facility - EEG showed diffuse nonspecific cerebral dysfunction with suggestion of focal neuronal dysfunction on the left   History of multiple strokes in the past, incidental finding of new ischemic stroke on MRI.  - Patient was followed closely by neurology, per Dr. Leonel Ramsay, small subcortical infarct is likely incidental and does not appear embolic.  - Continue aspirin and statin to be continued. Lipid panel showed LDL 110, A1c 9.6 -  CT angiogram of the head and neck showed no large or  proximal arterial branch occlusion in the intracranial circulation, no flow-limiting or critical stenosis in the major arterial vasculature of the neck. - Outpatient neuro follow-up. - 2-D echo pending   Dysphagia. Due to combination of #1 and 2.  - Speech therapy following. Currently on dysphagia 2 diet.  DM type II with DKA upon admission. Poorly controlled - Hemoglobin A1c 9.6. Patient is now placed on Lantus and sliding scale insulin.  Dyslipidemia.  -On statin continue present dose.  GERD. -On H2 blocker continue.  Escherichia coli UTI.  -Discontinue Foley catheter, Rocephin 3 days. Clinically resolved.  Hypertension.  -On beta blocker and Norvasc for better control.   Code Status: Full CODE STATUS  Family Communication: Discussed in detail with the patient, all imaging results, lab results explained to the patient's daughter   Disposition Plan: Needs skilled nursing facility, social worker consult placed, likely in am   Time Spent in minutes   25 minutes  Procedures  EEG  Consults   Critical care Neurology  DVT Prophylaxis  Lovenox  Medications  Scheduled Meds: . amLODipine  10 mg Oral Daily  . antiseptic oral rinse  7 mL Mouth Rinse QID  . aspirin  324 mg Oral  Daily  . atorvastatin  20 mg Oral q1800  . chlorhexidine gluconate (SAGE KIT)  15 mL Mouth Rinse BID  . enoxaparin (LOVENOX) injection  30 mg Subcutaneous Q24H  . famotidine  20 mg Per Tube BID  . insulin aspart  0-5 Units Subcutaneous QHS  . insulin aspart  0-9 Units Subcutaneous TID WC  . insulin glargine  25 Units Subcutaneous Daily  . labetalol  100 mg Oral TID  . levETIRAcetam  1,000 mg Oral Q12H   Continuous Infusions:  PRN Meds:.fentaNYL (SUBLIMAZE) injection, hydrALAZINE, labetalol   Antibiotics   Anti-infectives    Start     Dose/Rate Route Frequency Ordered Stop   04/29/16 1045  cefTRIAXone (ROCEPHIN) 1 g in dextrose 5 % 50 mL IVPB     1 g 100 mL/hr over 30 Minutes Intravenous  Every 24 hours 04/28/16 1056 04/29/16 1053   04/25/16 1045  cefTRIAXone (ROCEPHIN) 1 g in dextrose 5 % 50 mL IVPB  Status:  Discontinued     1 g 100 mL/hr over 30 Minutes Intravenous Every 24 hours 04/25/16 1031 04/28/16 1056        Subjective:   Dana Blair was seen and examined today. Patient denies dizziness, chest pain, shortness of breath, abdominal pain, N/V/D/C, new weakness, numbess, tingling. No acute events overnight.    Objective:   Filed Vitals:   04/29/16 1948 04/30/16 0039 04/30/16 0414 04/30/16 1123  BP: 144/72 168/89 152/63 142/66  Pulse: 73 78 76 80  Temp: 97.9 F (36.6 C)  97.6 F (36.4 C) 98.5 F (36.9 C)  TempSrc: Oral  Oral Oral  Resp: '18  18 18  '$ Height:      Weight:   63.86 kg (140 lb 12.6 oz)   SpO2: 100%  96% 99%    Intake/Output Summary (Last 24 hours) at 04/30/16 1154 Last data filed at 04/30/16 0936  Gross per 24 hour  Intake    800 ml  Output   1704 ml  Net   -904 ml     Wt Readings from Last 3 Encounters:  04/30/16 63.86 kg (140 lb 12.6 oz)  09/29/15 51.71 kg (114 lb)  08/02/15 53.479 kg (117 lb 14.4 oz)     Exam  General: Alert and oriented x 2, NAD  HEENT:  PERRLA, EOMI, Anicteric Sclera, mucous membranes moist.   Neck: Supple, no JVD, no masses  CVS: S1 S2 auscultated, no rubs, murmurs or gallops. Regular rate and rhythm.  Respiratory: Clear to auscultation bilaterally, no wheezing, rales or rhonchi  Abdomen: Soft, nontender, nondistended, + bowel sounds  Ext: no cyanosis clubbing or edema  Neuro: AAOx3, Cr N's II- XII. Strength 5/5 upper and lower extremities bilaterally  Skin: No rashes  Psych: Normal affect and demeanor, alert and oriented x2   Data Reviewed:  I have personally reviewed following labs and imaging studies  Micro Results Recent Results (from the past 240 hour(s))  Urine culture     Status: Abnormal   Collection Time: 04/23/16  3:28 AM  Result Value Ref Range Status   Specimen Description  URINE, CATHETERIZED  Final   Special Requests NONE  Final   Culture >=100,000 COLONIES/mL ESCHERICHIA COLI (A)  Final   Report Status 04/25/2016 FINAL  Final   Organism ID, Bacteria ESCHERICHIA COLI (A)  Final      Susceptibility   Escherichia coli - MIC*    AMPICILLIN <=2 SENSITIVE Sensitive     CEFAZOLIN <=4 SENSITIVE Sensitive     CEFTRIAXONE <=  1 SENSITIVE Sensitive     CIPROFLOXACIN <=0.25 SENSITIVE Sensitive     GENTAMICIN <=1 SENSITIVE Sensitive     IMIPENEM <=0.25 SENSITIVE Sensitive     NITROFURANTOIN <=16 SENSITIVE Sensitive     TRIMETH/SULFA <=20 SENSITIVE Sensitive     AMPICILLIN/SULBACTAM <=2 SENSITIVE Sensitive     PIP/TAZO <=4 SENSITIVE Sensitive     * >=100,000 COLONIES/mL ESCHERICHIA COLI  MRSA PCR Screening     Status: None   Collection Time: 04/23/16  5:23 AM  Result Value Ref Range Status   MRSA by PCR NEGATIVE NEGATIVE Final    Comment:        The GeneXpert MRSA Assay (FDA approved for NASAL specimens only), is one component of a comprehensive MRSA colonization surveillance program. It is not intended to diagnose MRSA infection nor to guide or monitor treatment for MRSA infections.   C difficile quick scan w PCR reflex     Status: Abnormal   Collection Time: 04/30/16  1:50 AM  Result Value Ref Range Status   C Diff antigen POSITIVE (A) NEGATIVE Final   C Diff toxin NEGATIVE NEGATIVE Final   C Diff interpretation   Final    C. difficile present, but toxin not detected. This indicates colonization. In most cases, this does not require treatment. If patient has signs and symptoms consistent with colitis, consider treatment. Requires ENTERIC precautions.    Radiology Reports Ct Angio Head W/cm &/or Wo Cm  04/23/2016  CLINICAL DATA:  Initial evaluation for acute seizure, right gaze deviation. EXAM: CT ANGIOGRAPHY HEAD AND NECK TECHNIQUE: Multidetector CT imaging of the head and neck was performed using the standard protocol during bolus administration of  intravenous contrast. Multiplanar CT image reconstructions and MIPs were obtained to evaluate the vascular anatomy. Carotid stenosis measurements (when applicable) are obtained utilizing NASCET criteria, using the distal internal carotid diameter as the denominator. CONTRAST:  80 cc of Isovue 370. COMPARISON:  Prior noncontrast head CT from earlier same day. FINDINGS: Study is limited by a technical error, with the mid and superior aspect of the the brain not visualized on arterial phase imaging. CTA NECK Aortic arch: Visualized aortic arch of normal caliber with normal 3 vessel morphology. Scattered calcified plaque within the arch itself and about the origin of the great vessels without flow-limiting stenosis. Scattered calcified plaque within the proximal subclavian arteries with mild narrowing. Subclavian arteries otherwise patent. Right carotid system: Right common carotid artery patent from its origin to the bifurcation. Mild plaque about the proximal right ICA with associated narrowing of approximately 30% by NASCET criteria. Probable small penetrating ulcer present at this level as well. Right ICA patent distally without stenosis, occlusion, or dissection. Multifocal atheromatous irregularity within the right ICA without high-grade stenosis. Right external carotid artery and its branches grossly normal. Left carotid system: Left common carotid artery patent from its origin to the bifurcation. Eccentric calcified plaque about the proximal left ICA with associated narrowing of approximately 30% by NASCET criteria. Distally, left ICA patent to the skullbase without additional stenosis, dissection, or occlusion. Multi focal atheromatous irregularity without high-grade stenosis. Left external carotid artery and its branches within normal limits. Vertebral arteries:Both vertebral arteries arise from the subclavian arteries. Right vertebral artery is dominant. Vertebral arteries patent along their entire course  without stenosis, dissection, or occlusion. Skeleton: No acute osseous abnormality. Multilevel degenerative spondylolysis within the cervical spine, most prevalent at C5-6 and C6-7. Well-circumscribed lucent lesion within the C5 vertebral body favored to be benign. Other neck:  Partially visualized lungs are clear. Superior mediastinum demonstrates no abnormality. Thyroid gland normal. No significant adenopathy. No acute soft tissue abnormality. Patient is intubated. CTA HEAD Anterior circulation: Petrous segments are widely patent bilaterally. Scattered calcified atheromatous plaque within the cavernous ICAs without flow-limiting stenosis. Supraclinoid segments widely patent. A1 segments patent. Previous identified moderate to severe or right A1 and more mild left a 1 stenosis is stable. Right A1 segment is hypoplastic, which likely accounts for the slightly diminutive right ICA as compared to the left. Anterior communicating artery normal. Partially visualized anterior cerebral arteries patent. M1 segments patent without stenosis or occlusion. MCA bifurcations normal. No proximal M2 branch occlusion or stenosis. Distal MCA branches not well evaluated on this exam due to technical error. Probable distal small vessel atheromatous irregularity suspected within the left MCA branches. Posterior circulation: Vertebral arteries patent to the vertebrobasilar junction. Right vertebral artery dominant. Posterior inferior cerebral arteries patent. Basilar artery patent to its distal aspect. Superior cerebral arteries patent bilaterally. Both posterior cerebral arteries arise from the basilar artery and are well opacified to their distal aspects. Venous sinuses: Not well evaluated on this exam due to timing of the contrast bolus and technical error. Anatomic variants: No anatomic variant.  No aneurysm. Delayed phase: Not performed. IMPRESSION: CTA NECK IMPRESSION: 1. No flow limiting or critical stenosis identified within the  major arterial vasculature of the neck. 2. Atheromatous plaque at the proximal ICAs with associated stenoses of approximately 30% by NASCET criteria bilaterally. CTA HEAD IMPRESSION: 1. No large or proximal arterial branch occlusion within the intracranial circulation. No high-grade or correctable stenosis. 2. Atheromatous plaque throughout the cavernous ICAs with secondary mild to moderate multi focal irregularity and narrowing. 3. Stable moderate to severe right and more mild proximal left ACA stenoses. A page has been put into the neurohospitalist on call at the time of this dictation. Electronically Signed   By: Jeannine Boga M.D.   On: 04/23/2016 04:08   Ct Head Wo Contrast  04/23/2016  CLINICAL DATA:  Code stroke.  Initial encounter. EXAM: CT HEAD WITHOUT CONTRAST TECHNIQUE: Contiguous axial images were obtained from the base of the skull through the vertex without intravenous contrast. COMPARISON:  CT of the head performed 07/27/2015 FINDINGS: There is no evidence of acute infarction, mass lesion, or intra- or extra-axial hemorrhage on CT. Prominence of the ventricles and sulci reflects mild to moderate cortical volume loss. Scattered periventricular and subcortical white matter change likely reflects small vessel ischemic microangiopathy. The brainstem and fourth ventricle are within normal limits. The basal ganglia are unremarkable in appearance. The cerebral hemispheres demonstrate grossly normal gray-white differentiation. No mass effect or midline shift is seen. There is no evidence of fracture; visualized osseous structures are unremarkable in appearance. The orbits are within normal limits. The paranasal sinuses and mastoid air cells are well-aerated. No significant soft tissue abnormalities are seen. IMPRESSION: 1. No acute intracranial pathology seen on CT. 2. Mild to moderate cortical volume loss and scattered small vessel ischemic microangiopathy. These results were called by telephone at  the time of interpretation on 04/23/2016 at 2:25 am to Dr. Wendee Beavers, who verbally acknowledged these results. Electronically Signed   By: Garald Balding M.D.   On: 04/23/2016 02:25   Ct Angio Neck W/cm &/or Wo/cm  04/23/2016  CLINICAL DATA:  Initial evaluation for acute seizure, right gaze deviation. EXAM: CT ANGIOGRAPHY HEAD AND NECK TECHNIQUE: Multidetector CT imaging of the head and neck was performed using the standard protocol during bolus administration  of intravenous contrast. Multiplanar CT image reconstructions and MIPs were obtained to evaluate the vascular anatomy. Carotid stenosis measurements (when applicable) are obtained utilizing NASCET criteria, using the distal internal carotid diameter as the denominator. CONTRAST:  80 cc of Isovue 370. COMPARISON:  Prior noncontrast head CT from earlier same day. FINDINGS: Study is limited by a technical error, with the mid and superior aspect of the the brain not visualized on arterial phase imaging. CTA NECK Aortic arch: Visualized aortic arch of normal caliber with normal 3 vessel morphology. Scattered calcified plaque within the arch itself and about the origin of the great vessels without flow-limiting stenosis. Scattered calcified plaque within the proximal subclavian arteries with mild narrowing. Subclavian arteries otherwise patent. Right carotid system: Right common carotid artery patent from its origin to the bifurcation. Mild plaque about the proximal right ICA with associated narrowing of approximately 30% by NASCET criteria. Probable small penetrating ulcer present at this level as well. Right ICA patent distally without stenosis, occlusion, or dissection. Multifocal atheromatous irregularity within the right ICA without high-grade stenosis. Right external carotid artery and its branches grossly normal. Left carotid system: Left common carotid artery patent from its origin to the bifurcation. Eccentric calcified plaque about the proximal left ICA with  associated narrowing of approximately 30% by NASCET criteria. Distally, left ICA patent to the skullbase without additional stenosis, dissection, or occlusion. Multi focal atheromatous irregularity without high-grade stenosis. Left external carotid artery and its branches within normal limits. Vertebral arteries:Both vertebral arteries arise from the subclavian arteries. Right vertebral artery is dominant. Vertebral arteries patent along their entire course without stenosis, dissection, or occlusion. Skeleton: No acute osseous abnormality. Multilevel degenerative spondylolysis within the cervical spine, most prevalent at C5-6 and C6-7. Well-circumscribed lucent lesion within the C5 vertebral body favored to be benign. Other neck: Partially visualized lungs are clear. Superior mediastinum demonstrates no abnormality. Thyroid gland normal. No significant adenopathy. No acute soft tissue abnormality. Patient is intubated. CTA HEAD Anterior circulation: Petrous segments are widely patent bilaterally. Scattered calcified atheromatous plaque within the cavernous ICAs without flow-limiting stenosis. Supraclinoid segments widely patent. A1 segments patent. Previous identified moderate to severe or right A1 and more mild left a 1 stenosis is stable. Right A1 segment is hypoplastic, which likely accounts for the slightly diminutive right ICA as compared to the left. Anterior communicating artery normal. Partially visualized anterior cerebral arteries patent. M1 segments patent without stenosis or occlusion. MCA bifurcations normal. No proximal M2 branch occlusion or stenosis. Distal MCA branches not well evaluated on this exam due to technical error. Probable distal small vessel atheromatous irregularity suspected within the left MCA branches. Posterior circulation: Vertebral arteries patent to the vertebrobasilar junction. Right vertebral artery dominant. Posterior inferior cerebral arteries patent. Basilar artery patent to  its distal aspect. Superior cerebral arteries patent bilaterally. Both posterior cerebral arteries arise from the basilar artery and are well opacified to their distal aspects. Venous sinuses: Not well evaluated on this exam due to timing of the contrast bolus and technical error. Anatomic variants: No anatomic variant.  No aneurysm. Delayed phase: Not performed. IMPRESSION: CTA NECK IMPRESSION: 1. No flow limiting or critical stenosis identified within the major arterial vasculature of the neck. 2. Atheromatous plaque at the proximal ICAs with associated stenoses of approximately 30% by NASCET criteria bilaterally. CTA HEAD IMPRESSION: 1. No large or proximal arterial branch occlusion within the intracranial circulation. No high-grade or correctable stenosis. 2. Atheromatous plaque throughout the cavernous ICAs with secondary mild to moderate multi  focal irregularity and narrowing. 3. Stable moderate to severe right and more mild proximal left ACA stenoses. A page has been put into the neurohospitalist on call at the time of this dictation. Electronically Signed   By: Jeannine Boga M.D.   On: 04/23/2016 04:08   Mr Brain Wo Contrast  04/23/2016  CLINICAL DATA:  Initial evaluation for acute seizure, right gaze deviation. EXAM: MRI HEAD WITHOUT CONTRAST TECHNIQUE: Multiplanar, multiecho pulse sequences of the brain and surrounding structures were obtained without intravenous contrast. COMPARISON:  Prior studies from earlier same day as well as previous brain MRI from 07/27/2015. FINDINGS: Diffuse prominence of the CSF containing spaces is compatible with generalized age-related cerebral atrophy, progressed relative to most recent MRI. Confluent T2/FLAIR hyperintensity involving the periventricular and deep greater than subcortical white matter has also progressed relative to prior. Chronic small vessel ischemic changes present within the pons. Numerous dilated perivascular spaces within the basal ganglia,  similar to prior. Remote lacunar infarct with chronic hemosiderin staining present within the body of the right caudate/right internal capsule. Additional small chronic lacunar infarctions within the bilateral thalami. Small chronic micro hemorrhages within the pons as well. Probable additional tiny remote left cerebellar infarct. Mildly hyperintense 9 mm diffusion abnormality present within the right periatrial white matter (series 4, image 26). This likely reflects the sequela of acute/early subacute small vessel ischemia. No other acute infarct. Gray-white matter differentiation maintained. Major intracranial vascular flow voids are preserved. No acute intracranial hemorrhage. The no mass lesion, midline shift, or mass effect. Ventricular prominence related to global parenchymal volume loss of hydrocephalus. Hippocampi are symmetric in size and appearance with normal signal intensity and morphology. No extra-axial fluid collection. Scattered FLAIR sequence seen throughout the supratentorial brain on this exam favored to related to motion artifact. Craniocervical junction normal. Visualized upper cervical spine unremarkable. 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 No acute abnormality about the orbits. Sequela prior lens extraction noted bilaterally. Fluid within the posterior nasopharynx. Patient is intubated. Paranasal sinuses are otherwise clear. Trace opacity within the right mastoid air cells. Mastoid air cells are otherwise clear. Inner ear structures normal. Bone marrow signal intensity normal. No scalp soft tissue abnormality. IMPRESSION: 1. Mildly hyperintense 9 mm diffusion abnormality within the right periatrial white matter, likely reflecting acute/early subacute small vessel ischemia. 2. No other acute intracranial process. 3. Moderate cerebral atrophy with chronic small vessel ischemic disease, progressed relative to most recent MRI from 7/21 07/27/2015. 4. Chronic hemorrhagic right basal ganglia  infarct, with additional chronic lacunar infarcts within the thalami, stable. Electronically Signed   By: Jeannine Boga M.D.   On: 04/23/2016 05:33   Dg Chest Port 1 View  04/25/2016  CLINICAL DATA:  Ventilator dependent respiratory failure. EXAM: PORTABLE CHEST 1 VIEW COMPARISON:  04/24/2016 and 04/23/2016 FINDINGS: Endotracheal tube, NG tube and central venous catheter appear essentially unchanged in position. The lungs are clear. Pulmonary vascularity is normal. Heart size is normal. Extensive calcification in the aortic arch. No acute bone abnormality. IMPRESSION: No acute abnormalities.  Aortic atherosclerosis. Electronically Signed   By: Lorriane Shire M.D.   On: 04/25/2016 07:43   Dg Chest Port 1 View  04/24/2016  CLINICAL DATA:  Respiratory difficulty EXAM: PORTABLE CHEST 1 VIEW COMPARISON:  04/23/2016 FINDINGS: Tubular devices are stable. Patchy bilateral apical pulmonary opacities have increased. Minimal patchy density at the retrocardiac left base. Upper normal heart size. No pneumothorax. Normal vascularity. IMPRESSION: Increased patchy pulmonary opacities at both lung apices and left base. Considerations  include patchy volume loss or patchy airspace disease. Electronically Signed   By: Marybelle Killings M.D.   On: 04/24/2016 07:34   Dg Chest Port 1 View  04/23/2016  CLINICAL DATA:  Central line placement. EXAM: PORTABLE CHEST 1 VIEW COMPARISON:  04/23/2016 FINDINGS: Endotracheal tube is 6 cm above the carina. Left central line tip in the SVC. No pneumothorax. NG tube tip in the stomach. Heart is normal size. No confluent airspace opacities or effusions. No acute bony abnormality. IMPRESSION: Support devices as above.  No pneumothorax.  No acute findings. Electronically Signed   By: Rolm Baptise M.D.   On: 04/23/2016 13:00   Dg Chest Portable 1 View  04/23/2016  CLINICAL DATA:  Endotracheal tube and orogastric tube placement. Initial encounter. EXAM: PORTABLE CHEST 1 VIEW COMPARISON:  Chest  radiograph performed 07/26/2015 FINDINGS: The patient's endotracheal tube is seen ending 1 cm above the carina. This could be retracted 2 cm. The patient's enteric tube is seen extending below the diaphragm. The lungs appear relatively clear. No pleural effusion or pneumothorax is seen. The cardiomediastinal silhouette is normal in size. No acute osseous abnormalities are identified. IMPRESSION: 1. Endotracheal tube seen ending 1 cm above the carina. This could be retracted 2 cm. 2. Enteric tube noted extending below the diaphragm. 3. Lungs clear bilaterally. These results were called by telephone at the time of interpretation on 04/23/2016 at 2:17 am to Dr. Delora Fuel, who verbally acknowledged these results. Electronically Signed   By: Garald Balding M.D.   On: 04/23/2016 02:17    CBC  Recent Labs Lab 04/24/16 0335 04/25/16 0424 04/26/16 0433 04/27/16 0404  WBC 14.5* 11.8* 10.2 7.7  HGB 12.6 11.8* 11.4* 10.8*  HCT 37.8 36.5 35.1* 34.0*  PLT 223 193 208 227  MCV 80.1 80.9 81.3 83.5  MCH 26.7 26.2 26.4 26.5  MCHC 33.3 32.3 32.5 31.8  RDW 13.6 14.0 14.4 14.7    Chemistries   Recent Labs Lab 04/24/16 0335 04/25/16 0424 04/26/16 0433 04/27/16 0404  NA 141 139 141 144  K 2.9* 3.2* 3.6 4.1  CL 107 107 110 113*  CO2 '23 22 23 24  '$ GLUCOSE 187* 244* 172* 107*  BUN 28* 18 26* 24*  CREATININE 1.28* 0.84 0.84 0.87  CALCIUM 8.9 8.6* 8.5* 8.6*  MG 2.0 1.8 2.4  --    ------------------------------------------------------------------------------------------------------------------ estimated creatinine clearance is 57.2 mL/min (by C-G formula based on Cr of 0.87). ------------------------------------------------------------------------------------------------------------------ No results for input(s): HGBA1C in the last 72 hours. ------------------------------------------------------------------------------------------------------------------ No results for input(s): CHOL, HDL, LDLCALC,  TRIG, CHOLHDL, LDLDIRECT in the last 72 hours. ------------------------------------------------------------------------------------------------------------------ No results for input(s): TSH, T4TOTAL, T3FREE, THYROIDAB in the last 72 hours.  Invalid input(s): FREET3 ------------------------------------------------------------------------------------------------------------------ No results for input(s): VITAMINB12, FOLATE, FERRITIN, TIBC, IRON, RETICCTPCT in the last 72 hours.  Coagulation profile No results for input(s): INR, PROTIME in the last 168 hours.  No results for input(s): DDIMER in the last 72 hours.  Cardiac Enzymes No results for input(s): CKMB, TROPONINI, MYOGLOBIN in the last 168 hours.  Invalid input(s): CK ------------------------------------------------------------------------------------------------------------------ Invalid input(s): [place]  04/28/16 2045 04/29/16 0623 04/29/16 1141 04/29/16 1659 04/30/16 0050 04/30/16 0659  GLUCAP 232* 167* 341* 53* 164* 178*     RAI,RIPUDEEP M.D. [facility] 04/30/2016, 11:54 AM  Pager: [phone] Between 7am to 7pm - call Pager - [phone]  After 7pm go to [email or web address] - password [email or web address]  Call night coverage person covering after 7pm

## 2016-04-30 NOTE — Progress Notes (Signed)
Occupational Therapy Treatment Patient Details Name: Dana GlossBarbara J Blair MRN: [id number] DOB: 05-28-1941 Today's Date: 04/30/2016    History of present illness Pt admitted with seizure in the setting of hyperglycemia. New onset afib. Required intubation 4/24-4/27. PMH: DM, CVA, HTN, CKD, syncope and glaucoma.   OT comments  Patient progressing towards OT goals, continue plan of care for now and continue to recommend SNF for rehab prior to d/c back home.   Follow Up Recommendations  SNF;Supervision/Assistance - 24 hour    Equipment Recommendations  Other (comment) (TBD next venue of care)    Recommendations for Other Services  None at this time   Precautions / Restrictions Precautions Precautions: Fall Precaution Comments: fall this admission 4/29 Restrictions Weight Bearing Restrictions: No     Mobility Bed Mobility Overal bed mobility: Needs Assistance Bed Mobility: Supine to Sit     Supine to sit: Min guard;HOB elevated     General bed mobility comments: Increased time w/ HOB elevated   Transfers Overall transfer level: Needs assistance Equipment used: Rolling walker (2 wheeled) Transfers: Sit to/from Stand Sit to Stand: Min assist         General transfer comment: Cues for hand placement and pt slow to rise w/ flexed posture.  Min assist to steady.    Balance Overall balance assessment: Needs assistance Sitting-balance support: No upper extremity supported;Feet supported Sitting balance-Leahy Scale: Good     Standing balance support: No upper extremity supported;During functional activity Standing balance-Leahy Scale: Fair Standing balance comment: Pt standing at sink for washing of hands with no UE support   ADL Overall ADL's : Needs assistance/impaired     Grooming: Wash/dry hands;Standing;Min guard Toilet Transfer: Min guard;RW;Ambulation;Comfort height toilet;Cueing for safety   Toileting- Clothing Manipulation and Hygiene:  Supervision/safety;Sitting/lateral lean       Functional mobility during ADLs: Min guard;Cueing for safety;Cueing for sequencing;Rolling walker       Cognition   Behavior During Therapy: Flat affect Overall Cognitive Status: Impaired/Different from baseline Area of Impairment: Orientation;Attention;Memory;Following commands;Safety/judgement;Problem solving Orientation Level: Disoriented to;Time;Situation;Place Current Attention Level: Sustained Memory: Decreased short-term memory  Following Commands: Follows one step commands inconsistently;Follows one step commands with increased time (pt requires multmodal cueing) Safety/Judgement: Decreased awareness of safety;Decreased awareness of deficits   Problem Solving: Slow processing;Decreased initiation;Requires verbal cues;Difficulty sequencing;Requires tactile cues General Comments: Multimodal cueing required for safety and initiation/direction. Pt continues to look around room like she's looking for something. While seated EOB with RW in front of her pt looking around and when asked what she was looking for she commented "my walker".                  Pertinent Vitals/ Pain       Pain Assessment: No/denies pain   Frequency Min 2X/week     Progress Toward Goals  OT Goals(current goals can now befound in the care plan section)  Progress towards OT goals: Progressing toward goals  Acute Rehab OT Goals Patient Stated Goal: none stated OT Goal Formulation: With family (pt unable to participate in goal setting) Time For Goal Achievement: 05/10/16 Potential to Achieve Goals: Good  Plan Discharge plan remains appropriate    End of Session Equipment Utilized During Treatment: Rolling walker;Gait belt   Activity Tolerance Patient tolerated treatment well   Patient Left in chair;with call bell/phone within reach;with chair alarm set;with family/visitor present  Nurse Communication Mobility status;Other (comment) (pt with +  urine)     Time: [id number] OT Time Calculation (min): 22  min  Charges: OT General Charges $OT Visit: 1 Procedure OT Treatments $Self Care/Home Management : 8-22 mins  Edwin Cap , MS, OTR/L, [place] Pager: [phone]  04/30/2016, 11:56 AM

## 2016-04-30 NOTE — NC FL2 (Signed)
[place] MEDICAID FL2 LEVEL OF CARE SCREENING TOOL     IDENTIFICATION  Patient Name: Dana Blair Birthdate: 1941/01/07 Sex: female Admission Date (Current Location): 04/23/2016  [facility] and [place] Number:  Herbalist and Address:  [facility], [place] [address] [place], [place] [address]      Provider Number: [id number]  Attending Physician Name and Address:  Mendel Corning, MD  Relative Name and Phone Number:       Current Level of Care: Hospital Recommended Level of Care: [place] Prior Approval Number:    Date Approved/Denied:   PASRR Number: [id number]  Discharge Plan: SNF    Current Diagnoses: Patient Active Problem List   Diagnosis Date Noted  . Acute respiratory failure ([place])   . Status epilepticus ([place]) 04/23/2016  . Encounter for central line placement   . Hydronephrosis   . Leukocytosis   . Severe protein-calorie malnutrition ([place]) 07/28/2015  . Syncope and collapse   . Chronic diastolic congestive heart failure ([place])   . LVH (left ventricular hypertrophy)   . Orthostatic hypotension   . Lactic acidosis   . AKI (acute kidney injury) ([place]) 07/26/2015  . Dehydration 07/26/2015  . Occult blood positive stool 07/26/2015  . Essential hypertension 08/13/2014  . Hyperlipidemia 08/13/2014  . History of stroke 08/13/2014  . DM2 (diabetes mellitus, type 2) ([place]) 08/13/2014  . Mild mitral regurgitation 08/13/2014  . First degree AV block 08/13/2014    Orientation RESPIRATION BLADDER Height & Weight     Self, Time, Place  Normal Continent Weight: 140 lb 12.6 oz (63.86 kg) Height:  5' 9" (175.3 cm)  BEHAVIORAL SYMPTOMS/MOOD NEUROLOGICAL BOWEL NUTRITION STATUS   (None)  (History of stroke/seizures) Continent Diet (DYS 2)  AMBULATORY STATUS COMMUNICATION OF NEEDS Skin   Limited Assist Verbally Normal                       Personal Care Assistance Level of Assistance  Bathing, Feeding, Dressing  Bathing Assistance: Limited assistance Feeding assistance: Limited assistance Dressing Assistance: Limited assistance     Functional Limitations Info  Sight, Hearing, Speech Sight Info: Adequate Hearing Info: Adequate Speech Info: Adequate    SPECIAL CARE FACTORS FREQUENCY  Blood pressure, Diabetic urine testing, PT (By licensed PT), OT (By licensed OT), Speech therapy     PT Frequency: 5 x week OT Frequency: 5 x week     Speech Therapy Frequency: 5 x week      Contractures Contractures Info: Not present    Additional Factors Info  Code Status, Allergies Code Status Info: Full Allergies Info: NKDA           Current Medications (04/30/2016):  This is the current hospital active medication list Current Facility-Administered Medications  Medication Dose Route Frequency Provider Last Rate Last Dose  . amLODipine (NORVASC) tablet 10 mg  10 mg Oral Daily Thurnell Lose, MD   10 mg at 04/30/16 0956  . antiseptic oral rinse solution (CORINZ)  7 mL Mouth Rinse QID Chesley Mires, MD   7 mL at 04/30/16 1200  . aspirin chewable tablet 324 mg  324 mg Oral Daily Rush Farmer, MD   324 mg at 04/30/16 1312  . atorvastatin (LIPITOR) tablet 20 mg  20 mg Oral q1800 Greta Doom, MD   20 mg at 04/29/16 1722  . chlorhexidine gluconate (SAGE KIT) (PERIDEX) 0.12 % solution 15 mL  15 mL Mouth Rinse BID  Vineet Sood, MD   15 mL at 04/30/16 1000  . enoxaparin (LOVENOX) injection 30 mg  30 mg Subcutaneous Q24H Rakesh Alva V, MD   30 mg at 04/30/16 0956  . famotidine (PEPCID) 40 MG/5ML suspension 20 mg  20 mg Per Tube BID  Ellen Stephens, MD   20 mg at 04/30/16 0956  . fentaNYL (SUBLIMAZE) injection 25-100 mcg  25-100 mcg Intravenous Q2H PRN Rakesh Alva V, MD   25 mcg at 04/25/16 1626  . hydrALAZINE (APRESOLINE) injection 10-20 mg  10-20 mg Intravenous Q4H PRN Rakesh Alva V, MD   20 mg at 04/27/16 1107  . insulin aspart (novoLOG) injection 0-5 Units  0-5 Units Subcutaneous QHS Prashant  K Singh, MD   2 Units at 04/28/16 2340  . insulin aspart (novoLOG) injection 0-9 Units  0-9 Units Subcutaneous TID WC Prashant K Singh, MD   7 Units at 04/30/16 1310  . insulin glargine (LANTUS) injection 25 Units  25 Units Subcutaneous Daily Prashant K Singh, MD   25 Units at 04/30/16 0958  . labetalol (NORMODYNE) tablet 100 mg  100 mg Oral TID Wesam G Yacoub, MD   100 mg at 04/30/16 0956  . labetalol (NORMODYNE,TRANDATE) injection 10 mg  10 mg Intravenous Q4H PRN Jennings E Nestor, MD   10 mg at 04/25/16 0536  . levETIRAcetam (KEPPRA) tablet 1,000 mg  1,000 mg Oral Q12H McNeill P Kirkpatrick, MD   1,000 mg at 04/30/16 0956     Discharge Medications: Please see discharge summary for a list of discharge medications.  Relevant Imaging Results:  Relevant Lab Results:   Additional Information SS#: [id number]   C , LCSW     

## 2016-04-30 NOTE — Progress Notes (Signed)
Inpatient Diabetes Program Recommendations  AACE/ADA: New Consensus Statement on Inpatient Glycemic Control (2015)  Target Ranges:  Prepandial:   less than 140 mg/dL      Peak postprandial:   less than 180 mg/dL (1-2 hours)      Critically ill patients:  140 - 180 mg/dL   Review of Glycemic Control  Results for Scharlene GlossGILMORE, Kaileena J (MRN [id number]) as of 04/30/2016 16:18  Ref. Range 04/29/2016 11:41 04/29/2016 16:59 04/30/2016 00:50 04/30/2016 06:59 04/30/2016 11:58  Glucose-Capillary Latest Ref Range: 65-99 mg/dL 956341 (H) 213273 (H) 086164 (H) 178 (H) 309 (H)   Post-prandial blood sugars elevated.  Inpatient Diabetes Program Recommendations:    Add Novolog 4 units tidwc for meal coverage insulin.  Will follow. Thank you. Ailene Ardshonda Markiesha Delia, RD, LDN, CDE Inpatient Diabetes Coordinator [phone]

## 2016-04-30 NOTE — Clinical Social Work Placement (Signed)
   CLINICAL SOCIAL WORK PLACEMENT  NOTE  Date:  04/30/2016  Patient Details  Name: [name] MRN: [id number] Date of Birth: [birth year]  Clinical Social Work is seeking post-discharge placement for this patient at the Skilled  Nursing Facility level of care (*CSW will initial, date and re-position this form in  chart as items are completed):  Yes   Patient/family provided with [place] Clinical Social Work Department's list of facilities offering this level of care within the geographic area requested by the patient (or if unable, by the patient's family).  Yes   Patient/family informed of their freedom to choose among providers that offer the needed level of care, that participate in Medicare, Medicaid or managed care program needed by the patient, have an available bed and are willing to accept the patient.  Yes   Patient/family informed of [place]'s ownership interest in [facility] and [facility], as well as of the fact that they are under no obligation to receive care at these facilities.  PASRR submitted to EDS on       PASRR number received on 04/30/16     Existing PASRR number confirmed on       FL2 transmitted to all facilities in geographic area requested by pt/family on 04/30/16     FL2 transmitted to all facilities within larger geographic area on       Patient informed that his/her managed care company has contracts with or will negotiate with certain facilities, including the following:            Patient/family informed of bed offers received.  Patient chooses bed at       Physician recommends and patient chooses bed at      Patient to be transferred to   on  .  Patient to be transferred to facility by       Patient family notified on   of transfer.  Name of family member notified:        PHYSICIAN       Additional Comment:    _______________________________________________ Margarito LinerSarah C Laverle Pillard, LCSW 04/30/2016, 2:03 PM

## 2016-04-30 NOTE — Progress Notes (Signed)
Speech Language Pathology Treatment: Dysphagia;Cognitive-Linquistic  Patient Details Name: Dana Blair MRN: [id number] DOB: [birth year] Today's Date: 04/30/2016 Time: 1350-1405 SLP Time Calculation (min) (ACUTE ONLY): 15 min  Assessment / Plan / Recommendation Clinical Impression  Pt more alert, with improved focused and sustained attention and ability to follow commands.  Consumed trials of dys 3 solids with adequate attention to bolus, swift swallow response.  Min verbal cues for pacing,recall of precautions.  No s/s of aspiration with consumption of thin liquids.  Daughter present for session.  Recommend advancing diet to mechanical soft/dysphagia 3, thin liquids; continue meds whole in puree.     HPI HPI: Pt admitted with seizure in the setting of hyperglycemia. New onset afib. Required intubation 4/24-4/27. PMH: DM, CVA, HTN, CKD, syncope and glaucoma.      SLP Plan  Continue with current plan of care     Recommendations  Diet recommendations: Dysphagia 3 (mechanical soft);Thin liquid Liquids provided via: Cup Medication Administration: Whole meds with puree Supervision: Patient able to self feed;Intermittent supervision to cue for compensatory strategies Compensations: Minimize environmental distractions;Slow rate;Small sips/bites Postural Changes and/or Swallow Maneuvers: Seated upright 90 degrees             Oral Care Recommendations: Oral care BID Follow up Recommendations: Skilled Nursing facility Plan: Continue with current plan of care     GO               Dana Blair, [place] CCC/SLP Pager [phone]  Blenda MountsCouture, Dana Blair 04/30/2016, 2:09 PM

## 2016-04-30 NOTE — Clinical Social Work Note (Signed)
Clinical Social Work Assessment  Patient Details  Name: Dana Blair MRN: [id number] Date of Birth: [birth year]  Date of referral:  04/30/16               Reason for consult:  Facility Placement, Discharge Planning                Permission sought to share information with:  Facility Sport and exercise psychologist, Family Supports Permission granted to share information::  Yes, Verbal Permission Granted  Name::     Jina Raver-Lipscone  Agency::  SNF  Relationship::  Daughter  Contact Information:  [phone]  Housing/Transportation Living arrangements for the past 2 months:  Single Family Home Source of Information:  Adult Children Patient Interpreter Needed:  None Criminal Activity/Legal Involvement Pertinent to Current Situation/Hospitalization:  No - Comment as needed Significant Relationships:  Adult Children Lives with:  Adult Children Do you feel safe going back to the place where you live?  Yes Need for family participation in patient care:  Yes (Comment)  Care giving concerns:  PT recommends SNF placement once medically stable for discharge.   Social Worker assessment / plan:  CSW met with patient. Daughter at bedside. CSW introduced role and explained that discharge planning would be discussed. Patient did not speak with CSW. CSW provided patient's daughter with list of SNFs. For patient to be near her children, SNF preferences include [facility], [facility], and [facility]. Discharge either today or tomorrow depending on bed availability. No further concerns expressed. CSW will continue to follow patient for support and facilitate discharge once medically stable.  Employment status:  Retired Nurse, adult PT Recommendations:  [place] / Referral to community resources:  [place]  Patient/Family's Response to care:  Patient did not speak with CSW. Patient's daughter agreeable to SNF placement. Patient's  daughter and son involved in patient's care and supportive. Patient's daughter polite and appreciated social work intervention.  Patient/Family's Understanding of and Emotional Response to Diagnosis, Current Treatment, and Prognosis:  Patient's adult children knowledgeable of medical interventions and aware of possible discharge for today or tomorrow if patient stable.  Emotional Assessment Appearance:  Appears stated age Attitude/Demeanor/Rapport:  Unable to Assess Affect (typically observed):  Unable to Assess Orientation:  Oriented to Self, Oriented to Place, Oriented to  Time Alcohol / Substance use:  Never Used Psych involvement (Current and /or in the community):  No (Comment)  Discharge Needs  Concerns to be addressed:  Care Coordination Readmission within the last 30 days:  No Current discharge risk:  Dependent with Mobility Barriers to Discharge:  No Barriers Identified   Candie Chroman, LCSW 04/30/2016, 1:54 PM

## 2016-04-30 NOTE — Progress Notes (Signed)
Nutrition Follow-up  DOCUMENTATION CODES:   Not applicable  INTERVENTION:    Continue Dysphagia 2-thin liquid diet  NUTRITION DIAGNOSIS:   Inadequate oral intake related to inability to eat as evidenced by NPO status, resolved  GOAL:   Patient will meet greater than or equal to 90% of their needs, met  MONITOR:   PO intake, Labs, Weight trends, Skin, I & O's  ASSESSMENT:   81F with history of prior ischemic and hemorrhagic strokes who presents to the ED via EMS after her family found her seizing with R gaze deviation. Last known normal was around 10pm on 4/23. She continued to seize with mostly right-sided involvement, but occasional left-sided symptoms as well. She received '5mg'$  versed en route. She was desaturating and required intubation. CT head was negative for blood. By the time imaging was obtained she was outside the window for TPA. CTA was ordered. On arrival to the ED she was also in atrial fibrillation, which reportedly is new for her. She was placed on propofol for seizure suppression, loaded with keppra, given additional benzos, and vimpat was also added.  Patient extubated 4/27. S/p bedside swallow evaluation 4/28.   Advanced to Dys 2-thin liquid diet.   PO intake good at 80-100% per flowsheet records. Disposition: SNF placement.  Diet Order:  DIET DYS 2 Room service appropriate?: Yes; Fluid consistency:: Thin  Skin:  Reviewed, no issues  Last BM:  5/1  Height:   Ht Readings from Last 1 Encounters:  04/27/16 '5\' 9"'$  (1.753 m)    Weight:   Wt Readings from Last 1 Encounters:  04/30/16 140 lb 12.6 oz (63.86 kg)    Ideal Body Weight:  54.4 kg  BMI:  Body mass index is 20.78 kg/(m^2).  Estimated Nutritional Needs:   Kcal:  1500-1700  Protein:  70-80 gm  Fluid:  >/= 1.5 L  EDUCATION NEEDS:   No education needs identified at this time  Arthur Holms, RD, LDN Pager #: [phone] After-Hours Pager #: [phone]

## 2016-04-30 NOTE — Clinical Social Work Note (Signed)
CSW called patient's son [phone]) for assessment since patient is not fully oriented. CSW left voicemail with contact information.  Charlynn CourtSarah Nessie Nong, CSW [phone]

## 2016-05-01 ENCOUNTER — Inpatient Hospital Stay (HOSPITAL_COMMUNITY): Payer: Medicare Other

## 2016-05-01 DIAGNOSIS — I6789 Other cerebrovascular disease: Secondary | ICD-10-CM

## 2016-05-01 LAB — ECHOCARDIOGRAM COMPLETE
Height: 69 in
Weight: 2080 oz

## 2016-05-01 LAB — GLUCOSE, CAPILLARY
GLUCOSE-CAPILLARY: 159 mg/dL — AB (ref 65–99)
Glucose-Capillary: 187 mg/dL — ABNORMAL HIGH (ref 65–99)

## 2016-05-01 MED ORDER — INSULIN GLARGINE 100 UNIT/ML ~~LOC~~ SOLN: 25.0000 [IU] | Freq: Every day | SUBCUTANEOUS | Status: DC

## 2016-05-01 MED ORDER — LABETALOL HCL 100 MG PO TABS: 100.0000 mg | ORAL_TABLET | Freq: Three times a day (TID) | ORAL | Status: DC

## 2016-05-01 MED ORDER — FAMOTIDINE 40 MG/5ML PO SUSR: 20.0000 mg | Freq: Two times a day (BID) | ORAL | Status: DC

## 2016-05-01 MED ORDER — AMLODIPINE BESYLATE 10 MG PO TABS: 10.0000 mg | ORAL_TABLET | Freq: Every day | ORAL | Status: DC

## 2016-05-01 MED ORDER — ASPIRIN EC 325 MG PO TBEC: 325.0000 mg | DELAYED_RELEASE_TABLET | Freq: Every day | ORAL | Status: DC

## 2016-05-01 MED ORDER — LEVETIRACETAM 1000 MG PO TABS
1000.0000 mg | ORAL_TABLET | Freq: Two times a day (BID) | ORAL | Status: DC
Start: 2016-05-01 — End: 2016-05-24

## 2016-05-01 MED ORDER — INSULIN ASPART 100 UNIT/ML ~~LOC~~ SOLN: 0.0000 [IU] | Freq: Three times a day (TID) | SUBCUTANEOUS | Status: DC

## 2016-05-01 MED ORDER — ATORVASTATIN CALCIUM 20 MG PO TABS: 20.0000 mg | ORAL_TABLET | Freq: Every day | ORAL | Status: DC

## 2016-05-01 NOTE — Discharge Summary (Signed)
Physician Discharge Summary   Patient ID: Dana Blair MRN: [id number] DOB/AGE: [birth year] 75 y.o.  Admit date: 04/23/2016 Discharge date: 05/01/2016  Primary Care Physician:  Leanor Rubenstein, MD  Discharge Diagnoses:    . Status epilepticus [facility])   Incidental new ischemic stroke   Dysphagia   Diabetes mellitus type 2 with DKA on admission  Dyslipidemia  GERD  Escherichia coli UTI  Hypertension    Consults: Critical care Neurology  Recommendations for Outpatient Follow-up:  1. Please repeat CBC/BMET at next visit   DIET: Dysphagia 3 diet with thin liquids, meds whole in puree    Allergies:  No Known Allergies   DISCHARGE MEDICATIONS: Current Discharge Medication List    START taking these medications   Details  amLODipine (NORVASC) 10 MG tablet Take 1 tablet (10 mg total) by mouth daily.    aspirin EC 325 MG tablet Take 1 tablet (325 mg total) by mouth daily. Qty: 30 tablet, Refills: 0    atorvastatin (LIPITOR) 20 MG tablet Take 1 tablet (20 mg total) by mouth at bedtime.    famotidine (PEPCID) 40 MG/5ML suspension Take 2.5 mLs (20 mg total) by mouth 2 (two) times daily. Qty: 50 mL, Refills: 0    insulin aspart (NOVOLOG) 100 UNIT/ML injection Inject 0-9 Units into the skin 3 (three) times daily with meals. Sliding scale CBG 70 - 120: 0 units CBG 121 - 150: 1 unit,  CBG 151 - 200: 2 units,  CBG 201 - 250: 3 units,  CBG 251 - 300: 5 units,  CBG 301 - 350: 7 units,  CBG 351 - 400: 9 units   CBG > 400: 9 units and notify your MD Qty: 10 mL, Refills: 11    insulin glargine (LANTUS) 100 UNIT/ML injection Inject 0.25 mLs (25 Units total) into the skin daily. Qty: 10 mL, Refills: 11    labetalol (NORMODYNE) 100 MG tablet Take 1 tablet (100 mg total) by mouth 3 (three) times daily.    levETIRAcetam (KEPPRA) 1000 MG tablet Take 1 tablet (1,000 mg total) by mouth 2 (two) times daily. Qty: 60 tablet, Refills: 0      CONTINUE these medications which have NOT CHANGED    Details  nebivolol (BYSTOLIC) 5 MG tablet Take 1 tablet (5 mg total) by mouth daily. Qty: 30 tablet, Refills: 6      STOP taking these medications     aspirin 81 MG tablet      escitalopram (LEXAPRO) 10 MG tablet      sitaGLIPtin-metformin (JANUMET) 50-1000 MG per tablet      VYTORIN 10-20 MG tablet          Brief H and P: For complete details please refer to admission H and P, but in brief 75 year old African-American female with previous history of ischemic stroke, DM type II, essential hypertension, dyslipidemia who was admitted by pulmonary critical care on 04/23/2016 with status epilepticus. She initially required intubation to protect her airway, she was seen by neurology, was found to have incidental new ischemic stroke, she has now been transitioned to Keppra and is seizure free. Stabilized and transferred to the floor on 04/28/2016.   Hospital Course:  Status epilepticus. Resolved  - Patient was intubated for airway protection, admitted to ICU, she was subsequently extubated and has remained stable. O2 sats on 2% on room air at the time of discharge.  - Neurology was consulted, patient was placed on Keppra, no further seizures since admission.  - continue Keppra 1 g  twice a day.  - MRI brain showed mildly hyperintense 9 mm diffusion abnormality in the right periatrial white matter acute/early subacute small vessel ischemia, no other acute intracranial process.  - PT recommended skilled nursing facility  - EEG showed diffuse nonspecific cerebral dysfunction with suggestion of focal neuronal dysfunction on the left  - Patient has a scheduled outpatient neurology appointment with Dr. Everlena Cooper  History of multiple strokes in the past, incidental finding of new ischemic stroke on MRI.  - Patient was followed closely by neurology, per Dr. Amada Jupiter, small subcortical infarct is likely incidental and does not appear embolic.  - Continue aspirin and statin to be continued. Lipid  panel showed LDL 110, A1c 9.6  - CT angiogram of the head and neck showed no large or proximal arterial branch occlusion in the intracranial circulation, no flow-limiting or critical stenosis in the major arterial vasculature of the neck.  - Outpatient neuro follow-up scheduled.  - 2-D echo showed ef 60-65%  Dysphagia. Due to combination of #1 and 2.  - Speech therapy following. Currently on dysphagia 2 diet.   DM type II with DKA upon admission. Poorly controlled  - Hemoglobin A1c 9.6. Patient is now placed on Lantus and sliding scale insulin.   Dyslipidemia.  -On statin continue present dose.   GERD.  -On H2 blocker continue.   Escherichia coli UTI.  -Discontinued Foley catheter, patient received IV Rocephin for 3 days. Clinically resolved.   Hypertension.  -On beta blocker and Norvasc for better control.       Day of Discharge BP 177/92 mmHg  Pulse 100  Temp(Src) 98 F (36.7 C) (Oral)  Resp 16  Ht 5\' 9"  (1.753 m)  Wt 58.968 kg (130 lb)  BMI 19.19 kg/m2  SpO2 100%  Physical Exam: General: Alert and awake oriented x3 not in any acute distress. HEENT: anicteric sclera, pupils reactive to light and accommodation CVS: S1-S2 clear no murmur rubs or gallops Chest: clear to auscultation bilaterally, no wheezing rales or rhonchi Abdomen: soft nontender, nondistended, normal bowel sounds Extremities: no cyanosis, clubbing or edema noted bilaterally Neuro: Cranial nerves II-XII intact, no focal neurological deficits   The results of significant diagnostics from this hospitalization (including imaging, microbiology, ancillary and laboratory) are listed below for reference.    LAB RESULTS: Basic Metabolic Panel:  Recent Labs Lab 04/26/16 0433 04/27/16 0404  NA 141 144  K 3.6 4.1  CL 110 113*  CO2 23 24  GLUCOSE 172* 107*  BUN 26* 24*  CREATININE 0.84 0.87  CALCIUM 8.5* 8.6*  MG 2.4  --   PHOS 3.9  --    Liver Function Tests: No results for input(s): AST,  ALT, ALKPHOS, BILITOT, PROT, ALBUMIN in the last 168 hours. No results for input(s): LIPASE, AMYLASE in the last 168 hours. No results for input(s): AMMONIA in the last 168 hours. CBC:  Recent Labs Lab 04/26/16 0433 04/27/16 0404  WBC 10.2 7.7  HGB 11.4* 10.8*  HCT 35.1* 34.0*  MCV 81.3 83.5  PLT 208 227   Cardiac Enzymes: No results for input(s): CKTOTAL, CKMB, CKMBINDEX, TROPONINI in the last 168 hours. BNP: Invalid input(s): POCBNP CBG:  Recent Labs Lab 05/01/16 0626 05/01/16 1221  GLUCAP 159* 187*    Significant Diagnostic Studies:  Ct Angio Head W/cm &/or Wo Cm  04/23/2016  CLINICAL DATA:  Initial evaluation for acute seizure, right gaze deviation. EXAM: CT ANGIOGRAPHY HEAD AND NECK TECHNIQUE: Multidetector CT imaging of the head and neck was performed  using the standard protocol during bolus administration of intravenous contrast. Multiplanar CT image reconstructions and MIPs were obtained to evaluate the vascular anatomy. Carotid stenosis measurements (when applicable) are obtained utilizing NASCET criteria, using the distal internal carotid diameter as the denominator. CONTRAST:  80 cc of Isovue 370. COMPARISON:  Prior noncontrast head CT from earlier same day. FINDINGS: Study is limited by a technical error, with the mid and superior aspect of the the brain not visualized on arterial phase imaging. CTA NECK Aortic arch: Visualized aortic arch of normal caliber with normal 3 vessel morphology. Scattered calcified plaque within the arch itself and about the origin of the great vessels without flow-limiting stenosis. Scattered calcified plaque within the proximal subclavian arteries with mild narrowing. Subclavian arteries otherwise patent. Right carotid system: Right common carotid artery patent from its origin to the bifurcation. Mild plaque about the proximal right ICA with associated narrowing of approximately 30% by NASCET criteria. Probable small penetrating ulcer present at  this level as well. Right ICA patent distally without stenosis, occlusion, or dissection. Multifocal atheromatous irregularity within the right ICA without high-grade stenosis. Right external carotid artery and its branches grossly normal. Left carotid system: Left common carotid artery patent from its origin to the bifurcation. Eccentric calcified plaque about the proximal left ICA with associated narrowing of approximately 30% by NASCET criteria. Distally, left ICA patent to the skullbase without additional stenosis, dissection, or occlusion. Multi focal atheromatous irregularity without high-grade stenosis. Left external carotid artery and its branches within normal limits. Vertebral arteries:Both vertebral arteries arise from the subclavian arteries. Right vertebral artery is dominant. Vertebral arteries patent along their entire course without stenosis, dissection, or occlusion. Skeleton: No acute osseous abnormality. Multilevel degenerative spondylolysis within the cervical spine, most prevalent at C5-6 and C6-7. Well-circumscribed lucent lesion within the C5 vertebral body favored to be benign. Other neck: Partially visualized lungs are clear. Superior mediastinum demonstrates no abnormality. Thyroid gland normal. No significant adenopathy. No acute soft tissue abnormality. Patient is intubated. CTA HEAD Anterior circulation: Petrous segments are widely patent bilaterally. Scattered calcified atheromatous plaque within the cavernous ICAs without flow-limiting stenosis. Supraclinoid segments widely patent. A1 segments patent. Previous identified moderate to severe or right A1 and more mild left a 1 stenosis is stable. Right A1 segment is hypoplastic, which likely accounts for the slightly diminutive right ICA as compared to the left. Anterior communicating artery normal. Partially visualized anterior cerebral arteries patent. M1 segments patent without stenosis or occlusion. MCA bifurcations normal. No  proximal M2 branch occlusion or stenosis. Distal MCA branches not well evaluated on this exam due to technical error. Probable distal small vessel atheromatous irregularity suspected within the left MCA branches. Posterior circulation: Vertebral arteries patent to the vertebrobasilar junction. Right vertebral artery dominant. Posterior inferior cerebral arteries patent. Basilar artery patent to its distal aspect. Superior cerebral arteries patent bilaterally. Both posterior cerebral arteries arise from the basilar artery and are well opacified to their distal aspects. Venous sinuses: Not well evaluated on this exam due to timing of the contrast bolus and technical error. Anatomic variants: No anatomic variant.  No aneurysm. Delayed phase: Not performed. IMPRESSION: CTA NECK IMPRESSION: 1. No flow limiting or critical stenosis identified within the major arterial vasculature of the neck. 2. Atheromatous plaque at the proximal ICAs with associated stenoses of approximately 30% by NASCET criteria bilaterally.   CTA HEAD IMPRESSION: 1. No large or proximal arterial branch occlusion within the intracranial circulation. No high-grade or correctable stenosis. 2. Atheromatous plaque throughout  the cavernous ICAs with secondary mild to moderate multi focal irregularity and narrowing. 3. Stable moderate to severe right and more mild proximal left ACA stenoses. A page has been put into the neurohospitalist on call at the time of this dictation. Electronically Signed   By: Rise Mu M.D.   On: 04/23/2016 04:08   Ct Head Wo Contrast  04/23/2016  CLINICAL DATA:  Code stroke.  Initial encounter. EXAM: CT HEAD WITHOUT CONTRAST TECHNIQUE: Contiguous axial images were obtained from the base of the skull through the vertex without intravenous contrast. COMPARISON:  CT of the head performed 07/27/2015 FINDINGS: There is no evidence of acute infarction, mass lesion, or intra- or extra-axial hemorrhage on CT. Prominence  of the ventricles and sulci reflects mild to moderate cortical volume loss. Scattered periventricular and subcortical white matter change likely reflects small vessel ischemic microangiopathy. The brainstem and fourth ventricle are within normal limits. The basal ganglia are unremarkable in appearance. The cerebral hemispheres demonstrate grossly normal gray-white differentiation. No mass effect or midline shift is seen. There is no evidence of fracture; visualized osseous structures are unremarkable in appearance. The orbits are within normal limits. The paranasal sinuses and mastoid air cells are well-aerated. No significant soft tissue abnormalities are seen. IMPRESSION: 1. No acute intracranial pathology seen on CT. 2. Mild to moderate cortical volume loss and scattered small vessel ischemic microangiopathy. These results were called by telephone at the time of interpretation on 04/23/2016 at 2:25 am to Dr. Cena Benton, who verbally acknowledged these results. Electronically Signed   By: Roanna Raider M.D.   On: 04/23/2016 02:25   Ct Angio Neck W/cm &/or Wo/cm  04/23/2016  CLINICAL DATA:  Initial evaluation for acute seizure, right gaze deviation. EXAM: CT ANGIOGRAPHY HEAD AND NECK TECHNIQUE: Multidetector CT imaging of the head and neck was performed using the standard protocol during bolus administration of intravenous contrast. Multiplanar CT image reconstructions and MIPs were obtained to evaluate the vascular anatomy. Carotid stenosis measurements (when applicable) are obtained utilizing NASCET criteria, using the distal internal carotid diameter as the denominator. CONTRAST:  80 cc of Isovue 370. COMPARISON:  Prior noncontrast head CT from earlier same day. FINDINGS: Study is limited by a technical error, with the mid and superior aspect of the the brain not visualized on arterial phase imaging. CTA NECK Aortic arch: Visualized aortic arch of normal caliber with normal 3 vessel morphology. Scattered calcified  plaque within the arch itself and about the origin of the great vessels without flow-limiting stenosis. Scattered calcified plaque within the proximal subclavian arteries with mild narrowing. Subclavian arteries otherwise patent. Right carotid system: Right common carotid artery patent from its origin to the bifurcation. Mild plaque about the proximal right ICA with associated narrowing of approximately 30% by NASCET criteria. Probable small penetrating ulcer present at this level as well. Right ICA patent distally without stenosis, occlusion, or dissection. Multifocal atheromatous irregularity within the right ICA without high-grade stenosis. Right external carotid artery and its branches grossly normal. Left carotid system: Left common carotid artery patent from its origin to the bifurcation. Eccentric calcified plaque about the proximal left ICA with associated narrowing of approximately 30% by NASCET criteria. Distally, left ICA patent to the skullbase without additional stenosis, dissection, or occlusion. Multi focal atheromatous irregularity without high-grade stenosis. Left external carotid artery and its branches within normal limits. Vertebral arteries:Both vertebral arteries arise from the subclavian arteries. Right vertebral artery is dominant. Vertebral arteries patent along their entire course without stenosis, dissection, or  occlusion. Skeleton: No acute osseous abnormality. Multilevel degenerative spondylolysis within the cervical spine, most prevalent at C5-6 and C6-7. Well-circumscribed lucent lesion within the C5 vertebral body favored to be benign. Other neck: Partially visualized lungs are clear. Superior mediastinum demonstrates no abnormality. Thyroid gland normal. No significant adenopathy. No acute soft tissue abnormality. Patient is intubated. CTA HEAD Anterior circulation: Petrous segments are widely patent bilaterally. Scattered calcified atheromatous plaque within the cavernous ICAs  without flow-limiting stenosis. Supraclinoid segments widely patent. A1 segments patent. Previous identified moderate to severe or right A1 and more mild left a 1 stenosis is stable. Right A1 segment is hypoplastic, which likely accounts for the slightly diminutive right ICA as compared to the left. Anterior communicating artery normal. Partially visualized anterior cerebral arteries patent. M1 segments patent without stenosis or occlusion. MCA bifurcations normal. No proximal M2 branch occlusion or stenosis. Distal MCA branches not well evaluated on this exam due to technical error. Probable distal small vessel atheromatous irregularity suspected within the left MCA branches. Posterior circulation: Vertebral arteries patent to the vertebrobasilar junction. Right vertebral artery dominant. Posterior inferior cerebral arteries patent. Basilar artery patent to its distal aspect. Superior cerebral arteries patent bilaterally. Both posterior cerebral arteries arise from the basilar artery and are well opacified to their distal aspects. Venous sinuses: Not well evaluated on this exam due to timing of the contrast bolus and technical error. Anatomic variants: No anatomic variant.  No aneurysm. Delayed phase: Not performed. IMPRESSION: CTA NECK IMPRESSION: 1. No flow limiting or critical stenosis identified within the major arterial vasculature of the neck. 2. Atheromatous plaque at the proximal ICAs with associated stenoses of approximately 30% by NASCET criteria bilaterally. CTA HEAD IMPRESSION: 1. No large or proximal arterial branch occlusion within the intracranial circulation. No high-grade or correctable stenosis. 2. Atheromatous plaque throughout the cavernous ICAs with secondary mild to moderate multi focal irregularity and narrowing. 3. Stable moderate to severe right and more mild proximal left ACA stenoses. A page has been put into the neurohospitalist on call at the time of this dictation. Electronically  Signed   By: Rise Mu M.D.   On: 04/23/2016 04:08   Mr Brain Wo Contrast  04/23/2016  CLINICAL DATA:  Initial evaluation for acute seizure, right gaze deviation. EXAM: MRI HEAD WITHOUT CONTRAST TECHNIQUE: Multiplanar, multiecho pulse sequences of the brain and surrounding structures were obtained without intravenous contrast. COMPARISON:  Prior studies from earlier same day as well as previous brain MRI from 07/27/2015. FINDINGS: Diffuse prominence of the CSF containing spaces is compatible with generalized age-related cerebral atrophy, progressed relative to most recent MRI. Confluent T2/FLAIR hyperintensity involving the periventricular and deep greater than subcortical white matter has also progressed relative to prior. Chronic small vessel ischemic changes present within the pons. Numerous dilated perivascular spaces within the basal ganglia, similar to prior. Remote lacunar infarct with chronic hemosiderin staining present within the body of the right caudate/right internal capsule. Additional small chronic lacunar infarctions within the bilateral thalami. Small chronic micro hemorrhages within the pons as well. Probable additional tiny remote left cerebellar infarct. Mildly hyperintense 9 mm diffusion abnormality present within the right periatrial white matter (series 4, image 26). This likely reflects the sequela of acute/early subacute small vessel ischemia. No other acute infarct. Gray-white matter differentiation maintained. Major intracranial vascular flow voids are preserved. No acute intracranial hemorrhage. The no mass lesion, midline shift, or mass effect. Ventricular prominence related to global parenchymal volume loss of hydrocephalus. Hippocampi are symmetric in size and  appearance with normal signal intensity and morphology. No extra-axial fluid collection. Scattered FLAIR sequence seen throughout the supratentorial brain on this exam favored to related to motion artifact.  Craniocervical junction normal. Visualized upper cervical spine unremarkable. 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 No acute abnormality about the orbits. Sequela prior lens extraction noted bilaterally. Fluid within the posterior nasopharynx. Patient is intubated. Paranasal sinuses are otherwise clear. Trace opacity within the right mastoid air cells. Mastoid air cells are otherwise clear. Inner ear structures normal. Bone marrow signal intensity normal. No scalp soft tissue abnormality. IMPRESSION: 1. Mildly hyperintense 9 mm diffusion abnormality within the right periatrial white matter, likely reflecting acute/early subacute small vessel ischemia. 2. No other acute intracranial process. 3. Moderate cerebral atrophy with chronic small vessel ischemic disease, progressed relative to most recent MRI from 7/21 07/27/2015. 4. Chronic hemorrhagic right basal ganglia infarct, with additional chronic lacunar infarcts within the thalami, stable. Electronically Signed   By: Rise MuBenjamin  McClintock M.D.   On: 04/23/2016 05:33   Dg Chest Port 1 View  04/24/2016  CLINICAL DATA:  Respiratory difficulty EXAM: PORTABLE CHEST 1 VIEW COMPARISON:  04/23/2016 FINDINGS: Tubular devices are stable. Patchy bilateral apical pulmonary opacities have increased. Minimal patchy density at the retrocardiac left base. Upper normal heart size. No pneumothorax. Normal vascularity. IMPRESSION: Increased patchy pulmonary opacities at both lung apices and left base. Considerations include patchy volume loss or patchy airspace disease. Electronically Signed   By: Jolaine ClickArthur  Hoss M.D.   On: 04/24/2016 07:34   Dg Chest Port 1 View  04/23/2016  CLINICAL DATA:  Central line placement. EXAM: PORTABLE CHEST 1 VIEW COMPARISON:  04/23/2016 FINDINGS: Endotracheal tube is 6 cm above the carina. Left central line tip in the SVC. No pneumothorax. NG tube tip in the stomach. Heart is normal size. No confluent airspace opacities or effusions. No acute bony  abnormality. IMPRESSION: Support devices as above.  No pneumothorax.  No acute findings. Electronically Signed   By: Charlett NoseKevin  Dover M.D.   On: 04/23/2016 13:00   Dg Chest Portable 1 View  04/23/2016  CLINICAL DATA:  Endotracheal tube and orogastric tube placement. Initial encounter. EXAM: PORTABLE CHEST 1 VIEW COMPARISON:  Chest radiograph performed 07/26/2015 FINDINGS: The patient's endotracheal tube is seen ending 1 cm above the carina. This could be retracted 2 cm. The patient's enteric tube is seen extending below the diaphragm. The lungs appear relatively clear. No pleural effusion or pneumothorax is seen. The cardiomediastinal silhouette is normal in size. No acute osseous abnormalities are identified. IMPRESSION: 1. Endotracheal tube seen ending 1 cm above the carina. This could be retracted 2 cm. 2. Enteric tube noted extending below the diaphragm. 3. Lungs clear bilaterally. These results were called by telephone at the time of interpretation on 04/23/2016 at 2:17 am to Dr. Dione BoozeAVID GLICK, who verbally acknowledged these results. Electronically Signed   By: Roanna RaiderJeffery  Chang M.D.   On: 04/23/2016 02:17    2D ECHO: Study Conclusions  - Left ventricle: The cavity size was normal. There was mild  concentric hypertrophy. Systolic function was normal. The  estimated ejection fraction was in the range of 60% to 65%. There  was dynamic obstruction in the mid cavity, with a peak velocity  of 246.1 cm/sec and a peak gradient of 24 mm Hg. Wall motion was  normal; there were no regional wall motion abnormalities. Doppler  parameters are consistent with abnormal left ventricular  relaxation (grade 1 diastolic dysfunction). - Aortic valve: Transvalvular velocity was within  the normal range.  There was no stenosis. There was no regurgitation. - Mitral valve: Transvalvular velocity was within the normal range.  There was no evidence for stenosis. There was no regurgitation. - Right ventricle: The  cavity size was normal. Wall thickness was  normal. Systolic function was normal. - Pericardium, extracardiac: A trivial pericardial effusion was  identified. Features were not consistent with tamponade  physiology.  Disposition and Follow-up: Discharge Instructions    Increase activity slowly    Complete by:  As directed             DISPOSITION: Skilled nursing facility   DISCHARGE FOLLOW-UP Follow-up Information    Follow up with [facility].   Specialty:  Skilled Nursing Facility   Contact information:   [address] [place] [address] [phone]      Follow up with Leanor Rubenstein, MD. Schedule an appointment as soon as possible for a visit in 2 weeks.   Specialty:  Family Medicine   Why:  for hospital follow-up   Contact information:   [address] [place] [address] [phone]       Follow up with Cira Servant, DO On 05/24/2016.   Specialty:  Neurology   Why:  at 1PM. please arrive by 12:45 PM for paper work   Contact information:   [address] [place] [address] [phone]        Time spent on Discharge: 35 minutes  Signed:   RAI,RIPUDEEP M.D. [facility] 05/01/2016, 12:38 PM Pager: [phone]

## 2016-05-01 NOTE — Clinical Social Work Placement (Signed)
   CLINICAL SOCIAL WORK PLACEMENT  NOTE  Date:  05/01/2016  Patient Details  Name: Dana Blair MRN: [id number] Date of Birth: Aug 19, 1941  Clinical Social Work is seeking post-discharge placement for this patient at the Skilled  Nursing Facility level of care (*CSW will initial, date and re-position this form in  chart as items are completed):  Yes   Patient/family provided with [facility] Clinical Social Work Department's list of facilities offering this level of care within the geographic area requested by the patient (or if unable, by the patient's family).  Yes   Patient/family informed of their freedom to choose among providers that offer the needed level of care, that participate in Medicare, Medicaid or managed care program needed by the patient, have an available bed and are willing to accept the patient.  Yes   Patient/family informed of [facility]'s ownership interest in [facility] and [facility], as well as of the fact that they are under no obligation to receive care at these facilities.  PASRR submitted to EDS on       PASRR number received on 04/30/16     Existing PASRR number confirmed on       FL2 transmitted to all facilities in geographic area requested by pt/family on 04/30/16     FL2 transmitted to all facilities within larger geographic area on       Patient informed that his/her managed care company has contracts with or will negotiate with certain facilities, including the following:        Yes   Patient/family informed of bed offers received.  [facility]     Physician recommends and patient chooses bed at      Patient to be transferred to [facility] on 05/01/16.  Patient to be transferred to facility by [facility]     Patient family notified on 05/01/16 of transfer.  Name of family member notified:  CambodiaJina     PHYSICIAN       Additional Comment:    _______________________________________________ Margarito LinerSarah C Darden Flemister,  LCSW 05/01/2016, 10:17 AM

## 2016-05-01 NOTE — Progress Notes (Signed)
  Echocardiogram 2D Echocardiogram has been performed.  Leta JunglingCooper, Camri Molloy M 05/01/2016, 11:45 AM

## 2016-05-01 NOTE — Progress Notes (Signed)
[facility]. Pt in no distress. Pt's daughter aware of tranfer

## 2016-05-01 NOTE — Clinical Social Work Note (Signed)
CSW called patient's daughter Pearletha FurlJina [phone]) to extend bed offers. Patient will discharge [facility] today. Facility has been notified.  CSW will call [facility] once discharge summary complete and patient is cleared to leave.  Charlynn CourtSarah Sophiana Milanese, CSW [phone]

## 2016-05-01 NOTE — Progress Notes (Signed)
[facility]

## 2016-05-01 NOTE — Clinical Social Work Note (Signed)
CSW facilitated patient discharge including contacting patient family and facility to confirm patient discharge plans. Clinical information faxed to facility and family agreeable with plan. CSW arranged ambulance transport via [facility] to [facility]. RN to call report prior to discharge.  CSW will sign off for now as social work intervention is no longer needed. Please consult us again if new needs arise.  Darey Hershberger, CSW [phone]  

## 2016-05-02 ENCOUNTER — Non-Acute Institutional Stay (SKILLED_NURSING_FACILITY): Payer: Medicare Other | Admitting: Nurse Practitioner

## 2016-05-02 ENCOUNTER — Encounter: Payer: Self-pay | Admitting: Nurse Practitioner

## 2016-05-02 DIAGNOSIS — Z8673 Personal history of transient ischemic attack (TIA), and cerebral infarction without residual deficits: Secondary | ICD-10-CM

## 2016-05-02 DIAGNOSIS — E1100 Type 2 diabetes mellitus with hyperosmolarity without nonketotic hyperglycemic-hyperosmolar coma (NKHHC): Secondary | ICD-10-CM

## 2016-05-02 DIAGNOSIS — K219 Gastro-esophageal reflux disease without esophagitis: Secondary | ICD-10-CM | POA: Insufficient documentation

## 2016-05-02 DIAGNOSIS — G40901 Epilepsy, unspecified, not intractable, with status epilepticus: Secondary | ICD-10-CM

## 2016-05-02 DIAGNOSIS — I10 Essential (primary) hypertension: Secondary | ICD-10-CM

## 2016-05-02 NOTE — Assessment & Plan Note (Signed)
History of multiple strokes in the past, incidental finding of new ischemic stroke on MRI.  - Patient was followed closely by neurology, per Dr. Amada JupiterKirkpatrick, small subcortical infarct is likely incidental and does not appear embolic.  - Continue aspirin and statin to be continued. Lipid panel showed LDL 110, A1c 9.6  - CT angiogram of the head and neck showed no large or proximal arterial branch occlusion in the intracranial circulation, no flow-limiting or critical stenosis in the major arterial vasculature of the neck.  - 2-D echo showed ef 60-65%

## 2016-05-02 NOTE — Assessment & Plan Note (Addendum)
Increase Lantus 28u sq qd, dc SSI, Novolog 5u for CBG>150-400, 10u for >401, 04/25/15 Hgb a1c 9.6

## 2016-05-02 NOTE — Assessment & Plan Note (Signed)
Stable, continue Pepcid 40mg  bid.

## 2016-05-02 NOTE — Progress Notes (Signed)
Patient ID: Dana Blair, female   DOB: 05/06/1941, 75 y.o.   MRN: [id number]  Location:  [facility] Room Number: 113 A Place of Service:  SNF (31) Provider: Arna Snipe Mast NP  Leanor Rubenstein, MD  Patient Care Team: Deatra James, MD as PCP - General (Family Medicine)  Extended Emergency Contact Information Primary Emergency Contact: Harrigan,Walter Address: [address]          [place] [address] [place] Home Phone: [phone] Mobile Phone: [phone] Relation: Son Secondary Emergency Contact: Audie Box [place] of [place] Mobile Phone: [phone] Relation: Daughter  Code Status:  DNR Goals of care: Advanced Directive information Advanced Directives 05/02/2016  Does patient have an advance directive? No     Chief Complaint  Patient presents with  . Hospitalization Follow-up    Stroke    HPI:  Pt is a 75 y.o. female seen today for a hospital f/u s/p admission from Status epilepticus. Resolved Patient was intubated for airway protection, admitted to ICU, she was subsequently extubated and has remained stable. O2 sats on 2% on room air at the time of discharge. Neurology was consulted, patient was placed on Keppra, no further seizures since admission. MRI brain showed mildly hyperintense 9 mm diffusion abnormality in the right periatrial white matter acute/early subacute small vessel ischemia, no other acute intracranial process. EEG showed diffuse nonspecific cerebral dysfunction with suggestion of focal neuronal dysfunction on the left. Hx of uncontrolled T2DM, on insulin, last Hgb a1c 9s. Hx of multiple strokes.   Past Medical History  Diagnosis Date  . Diabetes (HCC)     Diabetes mellitus with chronic kidney disease   . Hypertension     [facility]- ECHO 06/29/09 - LVH, normal EF, renal ultrasound-no renal artery stenosis  . Hyperlipidemia   . 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 She had  concomitant respiratory failure. On May 22, 2008, CT of the head without contrast demonstrated a 9 x 20 mm acute hematoma within the head of the right caudate with intraventricular extension of hemorrhage. Neurosurgery consult-no ventriculostomy.  . Glaucoma   . Osteopenia   . Chronic kidney disease   . Syncope 07/26/2015   Past Surgical History  Procedure Laterality Date  . Other surgical history  1970    Hysterectomy  . Abdominal hysterectomy    . Flexible sigmoidoscopy N/A 07/30/2015    Procedure: FLEXIBLE SIGMOIDOSCOPY;  Surgeon: Dorena Cookey, MD;  Location: [facility] ENDOSCOPY;  Service: Endoscopy;  Laterality: N/A;  2 fleets enemas at 7:30 AM on July 30    No Known Allergies    Medication List       This list is accurate as of: 05/02/16  4:11 PM.  Always use your most recent med list.               amLODipine 10 MG tablet  Commonly known as:  NORVASC  Take 1 tablet (10 mg total) by mouth daily.     aspirin EC 325 MG tablet  Take 1 tablet (325 mg total) by mouth daily.     atorvastatin 20 MG tablet  Commonly known as:  LIPITOR  Take 1 tablet (20 mg total) by mouth at bedtime.     famotidine 40 MG/5ML suspension  Commonly known as:  PEPCID  Take 2.5 mLs (20 mg total) by mouth 2 (two) times daily.     insulin glargine 100 UNIT/ML injection  Commonly known as:  LANTUS  Inject 28 Units into the skin daily.     labetalol 100 MG tablet  Commonly known as:  NORMODYNE  Take 1 tablet (100 mg total) by mouth 3 (three) times daily.     levETIRAcetam 1000 MG tablet  Commonly known as:  KEPPRA  Take 1 tablet (1,000 mg total) by mouth 2 (two) times daily.     nebivolol 5 MG tablet  Commonly known as:  BYSTOLIC  Take 1 tablet (5 mg total) by mouth daily.     NOVOLOG 100 UNIT/ML injection  Generic drug:  insulin aspart  5 units with meals for CBG >150-400 10 units  If CBG >401        Review of Systems  Constitutional: Negative for fever, chills, diaphoresis, activity change,  appetite change, fatigue and unexpected weight change.  HENT: Positive for hearing loss. Negative for congestion, dental problem, drooling, ear discharge, ear pain, facial swelling, mouth sores, nosebleeds, trouble swallowing and voice change.   Eyes: Negative for photophobia, pain, discharge, redness, itching and visual disturbance.  Respiratory: Negative for apnea, cough, choking, chest tightness, shortness of breath, wheezing and stridor.   Cardiovascular: Negative for chest pain, palpitations and leg swelling.  Gastrointestinal: Negative for nausea, vomiting, abdominal pain, diarrhea, constipation and abdominal distention.  Endocrine: Negative for cold intolerance and heat intolerance.  Genitourinary: Negative for dysuria, frequency, flank pain and difficulty urinating.  Musculoskeletal: Positive for gait problem. Negative for myalgias, back pain, joint swelling, arthralgias, neck pain and neck stiffness.  Skin: Negative for color change, pallor, rash and wound.  Neurological: Negative for dizziness, tremors, seizures, syncope, speech difficulty, weakness, light-headedness, numbness and headaches.  Psychiatric/Behavioral: Negative for suicidal ideas, hallucinations, behavioral problems, confusion, sleep disturbance, self-injury, dysphoric mood, decreased concentration and agitation. The patient is not nervous/anxious and is not hyperactive.     Immunization History  Administered Date(s) Administered  . PPD Test 05/01/2016   Pertinent  Health Maintenance Due  Topic Date Due  . FOOT EXAM  07/12/1951  . OPHTHALMOLOGY EXAM  07/12/1951  . URINE MICROALBUMIN  07/12/1951  . MAMMOGRAM  07/12/1991  . COLONOSCOPY  07/12/1991  . DEXA SCAN  07/11/2006  . PNA vac Low Risk Adult (1 of 2 - PCV13) 07/11/2006  . INFLUENZA VACCINE  07/31/2016  . HEMOGLOBIN A1C  10/24/2016   No flowsheet data found. Functional Status Survey:    Filed Vitals:   05/02/16 1433  BP: 118/76  Pulse: 82  Temp: 98.4  F (36.9 C)  TempSrc: Oral  Resp: 20  Height: 5\' 4"  (1.626 m)  Weight: 130 lb (58.968 kg)   Body mass index is 22.3 kg/(m^2). Physical Exam  Constitutional: She is oriented to person, place, and time. She appears well-developed and well-nourished. No distress.  HENT:  Head: Normocephalic and atraumatic.  Eyes: Conjunctivae and EOM are normal. Pupils are equal, round, and reactive to light.  Neck: Normal range of motion. Neck supple. No tracheal deviation present. No thyromegaly present.  Cardiovascular: Normal rate and regular rhythm.   Murmur heard. Systolic murmur 2-3/6, left sternal border  Pulmonary/Chest: Effort normal and breath sounds normal. No respiratory distress. She has no wheezes. She has no rales.  Abdominal: Soft. Bowel sounds are normal. She exhibits no distension. There is no tenderness. There is no rebound.  Musculoskeletal: Normal range of motion. She exhibits no edema or tenderness.  Neurological: She is alert and oriented to person, place, and time. She has normal reflexes. She displays normal reflexes. No cranial nerve  deficit. She exhibits normal muscle tone. Coordination normal.  Skin: Skin is warm and dry. No rash noted. She is not diaphoretic. No erythema. No pallor.  Psychiatric: She has a normal mood and affect. Her behavior is normal. Judgment and thought content normal.    Labs reviewed:  Recent Labs  04/24/16 0335 04/25/16 0424 04/26/16 0433 04/27/16 0404  NA 141 139 141 144  K 2.9* 3.2* 3.6 4.1  CL 107 107 110 113*  CO2 23 22 23 24   GLUCOSE 187* 244* 172* 107*  BUN 28* 18 26* 24*  CREATININE 1.28* 0.84 0.84 0.87  CALCIUM 8.9 8.6* 8.5* 8.6*  MG 2.0 1.8 2.4  --   PHOS 2.4* 2.2* 3.9  --     Recent Labs  07/27/15 0320 07/28/15 0855 04/23/16 0150  AST 315* 100* 42*  ALT 207* 152* 30  ALKPHOS 63 74 126  BILITOT 0.6 1.2 0.7  PROT 6.7 7.5 8.1  ALBUMIN 2.8* 2.9* 3.9    Recent Labs  07/27/15 1336  04/23/16 0150  04/25/16 0424  04/26/16 0433 04/27/16 0404  WBC 21.0*  < > 10.7*  < > 11.8* 10.2 7.7  NEUTROABS 17.7*  --  7.9*  --   --   --   --   HGB 12.6  < > 15.0  < > 11.8* 11.4* 10.8*  HCT 37.8  < > 46.3*  < > 36.5 35.1* 34.0*  MCV 82.4  < > 84.2  < > 80.9 81.3 83.5  PLT 317  < > 260  < > 193 208 227  < > = values in this interval not displayed. No results found for: TSH Lab Results  Component Value Date   HGBA1C 9.6* 04/24/2016   Lab Results  Component Value Date   CHOL 194 04/25/2016   HDL 67 04/25/2016   LDLCALC 110* 04/25/2016   TRIG 87 04/25/2016   CHOLHDL 2.9 04/25/2016    Significant Diagnostic Results in last 30 days:  Ct Angio Head W/cm &/or Wo Cm  04/23/2016  CLINICAL DATA:  Initial evaluation for acute seizure, right gaze deviation. EXAM: CT ANGIOGRAPHY HEAD AND NECK TECHNIQUE: Multidetector CT imaging of the head and neck was performed using the standard protocol during bolus administration of intravenous contrast. Multiplanar CT image reconstructions and MIPs were obtained to evaluate the vascular anatomy. Carotid stenosis measurements (when applicable) are obtained utilizing NASCET criteria, using the distal internal carotid diameter as the denominator. CONTRAST:  80 cc of Isovue 370. COMPARISON:  Prior noncontrast head CT from earlier same day. FINDINGS: Study is limited by a technical error, with the mid and superior aspect of the the brain not visualized on arterial phase imaging. CTA NECK Aortic arch: Visualized aortic arch of normal caliber with normal 3 vessel morphology. Scattered calcified plaque within the arch itself and about the origin of the great vessels without flow-limiting stenosis. Scattered calcified plaque within the proximal subclavian arteries with mild narrowing. Subclavian arteries otherwise patent. Right carotid system: Right common carotid artery patent from its origin to the bifurcation. Mild plaque about the proximal right ICA with associated narrowing of approximately 30%  by NASCET criteria. Probable small penetrating ulcer present at this level as well. Right ICA patent distally without stenosis, occlusion, or dissection. Multifocal atheromatous irregularity within the right ICA without high-grade stenosis. Right external carotid artery and its branches grossly normal. Left carotid system: Left common carotid artery patent from its origin to the bifurcation. Eccentric calcified plaque about the proximal left ICA  with associated narrowing of approximately 30% by NASCET criteria. Distally, left ICA patent to the skullbase without additional stenosis, dissection, or occlusion. Multi focal atheromatous irregularity without high-grade stenosis. Left external carotid artery and its branches within normal limits. Vertebral arteries:Both vertebral arteries arise from the subclavian arteries. Right vertebral artery is dominant. Vertebral arteries patent along their entire course without stenosis, dissection, or occlusion. Skeleton: No acute osseous abnormality. Multilevel degenerative spondylolysis within the cervical spine, most prevalent at C5-6 and C6-7. Well-circumscribed lucent lesion within the C5 vertebral body favored to be benign. Other neck: Partially visualized lungs are clear. Superior mediastinum demonstrates no abnormality. Thyroid gland normal. No significant adenopathy. No acute soft tissue abnormality. Patient is intubated. CTA HEAD Anterior circulation: Petrous segments are widely patent bilaterally. Scattered calcified atheromatous plaque within the cavernous ICAs without flow-limiting stenosis. Supraclinoid segments widely patent. A1 segments patent. Previous identified moderate to severe or right A1 and more mild left a 1 stenosis is stable. Right A1 segment is hypoplastic, which likely accounts for the slightly diminutive right ICA as compared to the left. Anterior communicating artery normal. Partially visualized anterior cerebral arteries patent. M1 segments patent  without stenosis or occlusion. MCA bifurcations normal. No proximal M2 branch occlusion or stenosis. Distal MCA branches not well evaluated on this exam due to technical error. Probable distal small vessel atheromatous irregularity suspected within the left MCA branches. Posterior circulation: Vertebral arteries patent to the vertebrobasilar junction. Right vertebral artery dominant. Posterior inferior cerebral arteries patent. Basilar artery patent to its distal aspect. Superior cerebral arteries patent bilaterally. Both posterior cerebral arteries arise from the basilar artery and are well opacified to their distal aspects. Venous sinuses: Not well evaluated on this exam due to timing of the contrast bolus and technical error. Anatomic variants: No anatomic variant.  No aneurysm. Delayed phase: Not performed. IMPRESSION: CTA NECK IMPRESSION: 1. No flow limiting or critical stenosis identified within the major arterial vasculature of the neck. 2. Atheromatous plaque at the proximal ICAs with associated stenoses of approximately 30% by NASCET criteria bilaterally. CTA HEAD IMPRESSION: 1. No large or proximal arterial branch occlusion within the intracranial circulation. No high-grade or correctable stenosis. 2. Atheromatous plaque throughout the cavernous ICAs with secondary mild to moderate multi focal irregularity and narrowing. 3. Stable moderate to severe right and more mild proximal left ACA stenoses. A page has been put into the neurohospitalist on call at the time of this dictation. Electronically Signed   By: Rise Mu M.D.   On: 04/23/2016 04:08   Ct Head Wo Contrast  04/23/2016  CLINICAL DATA:  Code stroke.  Initial encounter. EXAM: CT HEAD WITHOUT CONTRAST TECHNIQUE: Contiguous axial images were obtained from the base of the skull through the vertex without intravenous contrast. COMPARISON:  CT of the head performed 07/27/2015 FINDINGS: There is no evidence of acute infarction, mass lesion,  or intra- or extra-axial hemorrhage on CT. Prominence of the ventricles and sulci reflects mild to moderate cortical volume loss. Scattered periventricular and subcortical white matter change likely reflects small vessel ischemic microangiopathy. The brainstem and fourth ventricle are within normal limits. The basal ganglia are unremarkable in appearance. The cerebral hemispheres demonstrate grossly normal gray-white differentiation. No mass effect or midline shift is seen. There is no evidence of fracture; visualized osseous structures are unremarkable in appearance. The orbits are within normal limits. The paranasal sinuses and mastoid air cells are well-aerated. No significant soft tissue abnormalities are seen. IMPRESSION: 1. No acute intracranial pathology seen on CT.  2. Mild to moderate cortical volume loss and scattered small vessel ischemic microangiopathy. These results were called by telephone at the time of interpretation on 04/23/2016 at 2:25 am to Dr. Cena Benton, who verbally acknowledged these results. Electronically Signed   By: Roanna Raider M.D.   On: 04/23/2016 02:25   Ct Angio Neck W/cm &/or Wo/cm  04/23/2016  CLINICAL DATA:  Initial evaluation for acute seizure, right gaze deviation. EXAM: CT ANGIOGRAPHY HEAD AND NECK TECHNIQUE: Multidetector CT imaging of the head and neck was performed using the standard protocol during bolus administration of intravenous contrast. Multiplanar CT image reconstructions and MIPs were obtained to evaluate the vascular anatomy. Carotid stenosis measurements (when applicable) are obtained utilizing NASCET criteria, using the distal internal carotid diameter as the denominator. CONTRAST:  80 cc of Isovue 370. COMPARISON:  Prior noncontrast head CT from earlier same day. FINDINGS: Study is limited by a technical error, with the mid and superior aspect of the the brain not visualized on arterial phase imaging. CTA NECK Aortic arch: Visualized aortic arch of normal caliber  with normal 3 vessel morphology. Scattered calcified plaque within the arch itself and about the origin of the great vessels without flow-limiting stenosis. Scattered calcified plaque within the proximal subclavian arteries with mild narrowing. Subclavian arteries otherwise patent. Right carotid system: Right common carotid artery patent from its origin to the bifurcation. Mild plaque about the proximal right ICA with associated narrowing of approximately 30% by NASCET criteria. Probable small penetrating ulcer present at this level as well. Right ICA patent distally without stenosis, occlusion, or dissection. Multifocal atheromatous irregularity within the right ICA without high-grade stenosis. Right external carotid artery and its branches grossly normal. Left carotid system: Left common carotid artery patent from its origin to the bifurcation. Eccentric calcified plaque about the proximal left ICA with associated narrowing of approximately 30% by NASCET criteria. Distally, left ICA patent to the skullbase without additional stenosis, dissection, or occlusion. Multi focal atheromatous irregularity without high-grade stenosis. Left external carotid artery and its branches within normal limits. Vertebral arteries:Both vertebral arteries arise from the subclavian arteries. Right vertebral artery is dominant. Vertebral arteries patent along their entire course without stenosis, dissection, or occlusion. Skeleton: No acute osseous abnormality. Multilevel degenerative spondylolysis within the cervical spine, most prevalent at C5-6 and C6-7. Well-circumscribed lucent lesion within the C5 vertebral body favored to be benign. Other neck: Partially visualized lungs are clear. Superior mediastinum demonstrates no abnormality. Thyroid gland normal. No significant adenopathy. No acute soft tissue abnormality. Patient is intubated. CTA HEAD Anterior circulation: Petrous segments are widely patent bilaterally. Scattered  calcified atheromatous plaque within the cavernous ICAs without flow-limiting stenosis. Supraclinoid segments widely patent. A1 segments patent. Previous identified moderate to severe or right A1 and more mild left a 1 stenosis is stable. Right A1 segment is hypoplastic, which likely accounts for the slightly diminutive right ICA as compared to the left. Anterior communicating artery normal. Partially visualized anterior cerebral arteries patent. M1 segments patent without stenosis or occlusion. MCA bifurcations normal. No proximal M2 branch occlusion or stenosis. Distal MCA branches not well evaluated on this exam due to technical error. Probable distal small vessel atheromatous irregularity suspected within the left MCA branches. Posterior circulation: Vertebral arteries patent to the vertebrobasilar junction. Right vertebral artery dominant. Posterior inferior cerebral arteries patent. Basilar artery patent to its distal aspect. Superior cerebral arteries patent bilaterally. Both posterior cerebral arteries arise from the basilar artery and are well opacified to their distal aspects. Venous sinuses: Not  well evaluated on this exam due to timing of the contrast bolus and technical error. Anatomic variants: No anatomic variant.  No aneurysm. Delayed phase: Not performed. IMPRESSION: CTA NECK IMPRESSION: 1. No flow limiting or critical stenosis identified within the major arterial vasculature of the neck. 2. Atheromatous plaque at the proximal ICAs with associated stenoses of approximately 30% by NASCET criteria bilaterally. CTA HEAD IMPRESSION: 1. No large or proximal arterial branch occlusion within the intracranial circulation. No high-grade or correctable stenosis. 2. Atheromatous plaque throughout the cavernous ICAs with secondary mild to moderate multi focal irregularity and narrowing. 3. Stable moderate to severe right and more mild proximal left ACA stenoses. A page has been put into the neurohospitalist on  call at the time of this dictation. Electronically Signed   By: Rise Mu M.D.   On: 04/23/2016 04:08   Mr Brain Wo Contrast  04/23/2016  CLINICAL DATA:  Initial evaluation for acute seizure, right gaze deviation. EXAM: MRI HEAD WITHOUT CONTRAST TECHNIQUE: Multiplanar, multiecho pulse sequences of the brain and surrounding structures were obtained without intravenous contrast. COMPARISON:  Prior studies from earlier same day as well as previous brain MRI from 07/27/2015. FINDINGS: Diffuse prominence of the CSF containing spaces is compatible with generalized age-related cerebral atrophy, progressed relative to most recent MRI. Confluent T2/FLAIR hyperintensity involving the periventricular and deep greater than subcortical white matter has also progressed relative to prior. Chronic small vessel ischemic changes present within the pons. Numerous dilated perivascular spaces within the basal ganglia, similar to prior. Remote lacunar infarct with chronic hemosiderin staining present within the body of the right caudate/right internal capsule. Additional small chronic lacunar infarctions within the bilateral thalami. Small chronic micro hemorrhages within the pons as well. Probable additional tiny remote left cerebellar infarct. Mildly hyperintense 9 mm diffusion abnormality present within the right periatrial white matter (series 4, image 26). This likely reflects the sequela of acute/early subacute small vessel ischemia. No other acute infarct. Gray-white matter differentiation maintained. Major intracranial vascular flow voids are preserved. No acute intracranial hemorrhage. The no mass lesion, midline shift, or mass effect. Ventricular prominence related to global parenchymal volume loss of hydrocephalus. Hippocampi are symmetric in size and appearance with normal signal intensity and morphology. No extra-axial fluid collection. Scattered FLAIR sequence seen throughout the supratentorial brain on this  exam favored to related to motion artifact. Craniocervical junction normal. Visualized upper cervical spine unremarkable. 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 No acute abnormality about the orbits. Sequela prior lens extraction noted bilaterally. Fluid within the posterior nasopharynx. Patient is intubated. Paranasal sinuses are otherwise clear. Trace opacity within the right mastoid air cells. Mastoid air cells are otherwise clear. Inner ear structures normal. Bone marrow signal intensity normal. No scalp soft tissue abnormality. IMPRESSION: 1. Mildly hyperintense 9 mm diffusion abnormality within the right periatrial white matter, likely reflecting acute/early subacute small vessel ischemia. 2. No other acute intracranial process. 3. Moderate cerebral atrophy with chronic small vessel ischemic disease, progressed relative to most recent MRI from 7/21 07/27/2015. 4. Chronic hemorrhagic right basal ganglia infarct, with additional chronic lacunar infarcts within the thalami, stable. Electronically Signed   By: Rise Mu M.D.   On: 04/23/2016 05:33   Dg Chest Port 1 View  04/24/2016  CLINICAL DATA:  Respiratory difficulty EXAM: PORTABLE CHEST 1 VIEW COMPARISON:  04/23/2016 FINDINGS: Tubular devices are stable. Patchy bilateral apical pulmonary opacities have increased. Minimal patchy density at the retrocardiac left base. Upper normal heart size. No pneumothorax. Normal vascularity.  IMPRESSION: Increased patchy pulmonary opacities at both lung apices and left base. Considerations include patchy volume loss or patchy airspace disease. Electronically Signed   By: Jolaine ClickArthur  Hoss M.D.   On: 04/24/2016 07:34   Dg Chest Port 1 View  04/23/2016  CLINICAL DATA:  Central line placement. EXAM: PORTABLE CHEST 1 VIEW COMPARISON:  04/23/2016 FINDINGS: Endotracheal tube is 6 cm above the carina. Left central line tip in the SVC. No pneumothorax. NG tube tip in the stomach. Heart is normal size. No confluent  airspace opacities or effusions. No acute bony abnormality. IMPRESSION: Support devices as above.  No pneumothorax.  No acute findings. Electronically Signed   By: Charlett NoseKevin  Dover M.D.   On: 04/23/2016 13:00   Dg Chest Portable 1 View  04/23/2016  CLINICAL DATA:  Endotracheal tube and orogastric tube placement. Initial encounter. EXAM: PORTABLE CHEST 1 VIEW COMPARISON:  Chest radiograph performed 07/26/2015 FINDINGS: The patient's endotracheal tube is seen ending 1 cm above the carina. This could be retracted 2 cm. The patient's enteric tube is seen extending below the diaphragm. The lungs appear relatively clear. No pleural effusion or pneumothorax is seen. The cardiomediastinal silhouette is normal in size. No acute osseous abnormalities are identified. IMPRESSION: 1. Endotracheal tube seen ending 1 cm above the carina. This could be retracted 2 cm. 2. Enteric tube noted extending below the diaphragm. 3. Lungs clear bilaterally. These results were called by telephone at the time of interpretation on 04/23/2016 at 2:17 am to Dr. Dione BoozeAVID GLICK, who verbally acknowledged these results. Electronically Signed   By: Roanna RaiderJeffery  Chang M.D.   On: 04/23/2016 02:17    Assessment/Plan  Status epilepticus [facility]) Neurology was consulted, patient was placed on Keppra, no further seizures since admission. MRI brain showed mildly hyperintense 9 mm diffusion abnormality in the right periatrial white matter acute/early subacute small vessel ischemia, no other acute intracranial process. EEG showed diffuse nonspecific cerebral dysfunction with suggestion of focal neuronal dysfunction on the left    Essential hypertension Blood pressure is controlled, continue Norvasc 10mg , Labetalol 100mg  tid, Bystolic 5mg  daily.   History of stroke History of multiple strokes in the past, incidental finding of new ischemic stroke on MRI.  - Patient was followed closely by neurology, per Dr. Amada JupiterKirkpatrick, small subcortical infarct is likely  incidental and does not appear embolic.  - Continue aspirin and statin to be continued. Lipid panel showed LDL 110, A1c 9.6  - CT angiogram of the head and neck showed no large or proximal arterial branch occlusion in the intracranial circulation, no flow-limiting or critical stenosis in the major arterial vasculature of the neck.  - 2-D echo showed ef 60-65%   GERD (gastroesophageal reflux disease) Stable, continue Pepcid 40mg  bid.   DM2 (diabetes mellitus, type 2) (HCC) Increase Lantus 28u sq qd, dc SSI, Novolog 5u for CBG>150-400, 10u for >401, 04/25/15 Hgb a1c 9.6     Family/ staff Communication: continue SNF for care needs.   Labs/tests ordered: CBC, BMP

## 2016-05-02 NOTE — Assessment & Plan Note (Signed)
Neurology was consulted, patient was placed on Keppra, no further seizures since admission. MRI brain showed mildly hyperintense 9 mm diffusion abnormality in the right periatrial white matter acute/early subacute small vessel ischemia, no other acute intracranial process. EEG showed diffuse nonspecific cerebral dysfunction with suggestion of focal neuronal dysfunction on the left

## 2016-05-02 NOTE — Assessment & Plan Note (Signed)
Blood pressure is controlled, continue Norvasc 10mg , Labetalol 100mg  tid, Bystolic 5mg  daily.

## 2016-05-03 ENCOUNTER — Encounter: Payer: Self-pay | Admitting: Internal Medicine

## 2016-05-03 ENCOUNTER — Non-Acute Institutional Stay (SKILLED_NURSING_FACILITY): Payer: Medicare Other | Admitting: Internal Medicine

## 2016-05-03 DIAGNOSIS — R269 Unspecified abnormalities of gait and mobility: Secondary | ICD-10-CM

## 2016-05-03 DIAGNOSIS — E1100 Type 2 diabetes mellitus with hyperosmolarity without nonketotic hyperglycemic-hyperosmolar coma (NKHHC): Secondary | ICD-10-CM

## 2016-05-03 DIAGNOSIS — G40901 Epilepsy, unspecified, not intractable, with status epilepticus: Secondary | ICD-10-CM

## 2016-05-03 DIAGNOSIS — R1310 Dysphagia, unspecified: Secondary | ICD-10-CM | POA: Diagnosis not present

## 2016-05-03 DIAGNOSIS — Z794 Long term (current) use of insulin: Secondary | ICD-10-CM | POA: Diagnosis not present

## 2016-05-03 DIAGNOSIS — R5381 Other malaise: Secondary | ICD-10-CM

## 2016-05-03 DIAGNOSIS — F0390 Unspecified dementia without behavioral disturbance: Secondary | ICD-10-CM | POA: Diagnosis not present

## 2016-05-03 DIAGNOSIS — J9601 Acute respiratory failure with hypoxia: Secondary | ICD-10-CM

## 2016-05-03 DIAGNOSIS — I483 Typical atrial flutter: Secondary | ICD-10-CM | POA: Diagnosis not present

## 2016-05-03 DIAGNOSIS — Z8673 Personal history of transient ischemic attack (TIA), and cerebral infarction without residual deficits: Secondary | ICD-10-CM | POA: Diagnosis not present

## 2016-05-03 NOTE — Progress Notes (Signed)
Patient ID: Dana Blair, female   DOB: 20-May-1941, 75 y.o.   MRN: [id number]  Provider:   Location:  [facility] Room Number: 113A Place of Service:  SNF (31)  PCP: Kimber Relic, MD Patient Care Team: Kimber Relic, MD as PCP - General (Internal Medicine) Man Mast X, NP as Nurse Practitioner (Internal Medicine)  Extended Emergency Contact Information Primary Emergency Contact: Enyeart,Walter Address: [address]          [place] [address] [place] Home Phone: [phone] Mobile Phone: [phone] Relation: Son Secondary Emergency Contact: Audie Box [place] of [place] Mobile Phone: [phone] Relation: Daughter  Code Status: full Goals of Care: Advanced Directive information Advanced Directives 05/03/2016  Does patient have an advance directive? No      Chief Complaint  Patient presents with  . New Admit To SNF    following hospitalization 04/23/16 to 05/01/16 for status epilepticus, new ischemic stroke    HPI: Patient is a 75 y.o. female seen today for admission to [facility] skilled nursing facility on 05/01/2016. Patient was hospitalized from 04/23/2016 through 05/01/2016. She was in status epilepticus on admission and required intubation. Patient had no prior history of seizures. Patient was started on Keppra and is to remain on this drug.   MRI of the brain showed cerebral atrophy, old lacunar infarcts and small vessel disease. There was an area in the right brain that may have been an acute stroke.  Patient had residual deficits of dysphagia and confusion. She has no recall of any of the hospital events.  Patient is non-insulin using diabetic. Hemoglobin A1c was noted to be 9.6, suggestive of poor control. Patient was placed on Lantus and sliding scale insulin during this hospital stay.  She had an Escherichia coli urinary tract infection during this admission and was treated with Rocephin for 3  days.  Other problems include dyslipidemia, GERD, hypertension, debility, unstable gait, and atrial flutter on 04/23/16. Patient did convert to normal sinus rhythm during her hospitalization.  Because of her general weakness and loss of strength during the hospitalization, she is not admitted to skilled nursing facility for strengthening, training in safe mobility, and increasing skills and self-care. Her son was at the bedside and tells me that she had some dementia preceding her last hospitalization.   Past Medical History  Diagnosis Date  . Diabetes (HCC)     Diabetes mellitus with chronic kidney disease   . Hypertension     [facility]- ECHO 06/29/09 - LVH, normal EF, renal ultrasound-no renal artery stenosis  . Hyperlipidemia   . 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 She had concomitant respiratory failure. On May 22, 2008, CT of the head without contrast demonstrated a 9 x 20 mm acute hematoma within the head of the right caudate with intraventricular extension of hemorrhage. Neurosurgery consult-no ventriculostomy.  . Glaucoma   . Osteopenia   . Chronic kidney disease   . Syncope 07/26/2015  . Acute respiratory failure (HCC)   . Epilepsy with status epilepticus, not intractable (HCC)   . Elevated white blood cell count   . Unspecified severe protein-calorie malnutrition (HCC)   . Dehydration   . Acidosis   . Atrioventricular bloc first degree   . Chronic diastolic heart failure (HCC)   . Orthostatic hypotension   . Hydronephrosis   . Debility 05/05/2016  . Dysphagia 05/05/2016  . Abnormality of gait 05/05/2016  . Atrial  flutter (HCC) 05/05/2016  . Dementia 05/05/2016   Past Surgical History  Procedure Laterality Date  . Other surgical history  1970    Hysterectomy  . Abdominal hysterectomy    . Flexible sigmoidoscopy N/A 07/30/2015    Procedure: FLEXIBLE SIGMOIDOSCOPY;  Surgeon: Dorena CookeyJohn Hayes, MD;  Location[facility];  Service: Endoscopy;  Laterality: N/A;  2  fleets enemas at 7:30 AM on July 30    reports that she has never smoked. She has never used smokeless tobacco. She reports that she does not drink alcohol or use illicit drugs. Social History   Social History  . Marital Status: Married    Spouse Name: N/A  . Number of Children: N/A  . Years of Education: N/A   Occupational History  . retail, [employer]    Social History Main Topics  . Smoking status: Never Smoker   . Smokeless tobacco: Never Used  . Alcohol Use: No  . Drug Use: No  . Sexual Activity: No   Other Topics Concern  . Not on file   Social History Narrative   Lives at [facility] since 05/01/16   Widow   Never smoked   No advance directives         Family History  Problem Relation Age of Onset  . Diabetes Mother   . Diabetes Father   . Diabetes Sister     Health Maintenance  Topic Date Due  . FOOT EXAM  07/12/1951  . OPHTHALMOLOGY EXAM  07/12/1951  . URINE MICROALBUMIN  07/12/1951  . TETANUS/TDAP  07/11/1960  . MAMMOGRAM  07/12/1991  . COLONOSCOPY  07/12/1991  . ZOSTAVAX  07/11/2001  . DEXA SCAN  07/11/2006  . PNA vac Low Risk Adult (1 of 2 - PCV13) 07/11/2006  . INFLUENZA VACCINE  07/31/2016  . HEMOGLOBIN A1C  10/24/2016    No Known Allergies    Medication List       This list is accurate as of: 05/03/16  3:18 PM.  Always use your most recent med list.               amLODipine 10 MG tablet  Commonly known as:  NORVASC  Take 1 tablet (10 mg total) by mouth daily.     aspirin EC 325 MG tablet  Take 1 tablet (325 mg total) by mouth daily.     atorvastatin 20 MG tablet  Commonly known as:  LIPITOR  Take 1 tablet (20 mg total) by mouth at bedtime.     famotidine 40 MG/5ML suspension  Commonly known as:  PEPCID  Take 2.5 mLs (20 mg total) by mouth 2 (two) times daily.     insulin glargine 100 UNIT/ML injection  Commonly known as:  LANTUS  Inject 28 Units into the skin daily.     labetalol 100 MG tablet  Commonly known as:   NORMODYNE  Take 1 tablet (100 mg total) by mouth 3 (three) times daily.     levETIRAcetam 1000 MG tablet  Commonly known as:  KEPPRA  Take 1 tablet (1,000 mg total) by mouth 2 (two) times daily.     nebivolol 5 MG tablet  Commonly known as:  BYSTOLIC  Take 1 tablet (5 mg total) by mouth daily.     NOVOLOG 100 UNIT/ML injection  Generic drug:  insulin aspart  5 units with meals for CBG >150-400 10 units  If CBG >401        Review of Systems  Constitutional: Positive for fatigue. Negative  for fever, chills, diaphoresis, activity change, appetite change and unexpected weight change.  HENT: Positive for hearing loss. Negative for congestion, dental problem, drooling, ear discharge, ear pain, facial swelling, mouth sores, nosebleeds, trouble swallowing and voice change.   Eyes: Positive for visual disturbance (Corrective lenses). Negative for photophobia, pain, discharge, redness and itching.  Respiratory: Negative for apnea, cough, choking, chest tightness, shortness of breath, wheezing and stridor.        Required intubation while in status epilepticus.  Cardiovascular: Negative for chest pain, palpitations and leg swelling.  Gastrointestinal: Negative for nausea, vomiting, abdominal pain, diarrhea, constipation and abdominal distention.  Endocrine: Negative for cold intolerance and heat intolerance.  Genitourinary: Negative for dysuria, frequency, flank pain and difficulty urinating.  Musculoskeletal: Positive for gait problem. Negative for myalgias, back pain, joint swelling, arthralgias, neck pain and neck stiffness.  Skin: Negative for color change, pallor, rash and wound.  Neurological: Positive for tremors and seizures. Negative for dizziness, syncope, speech difficulty, weakness, light-headedness, numbness and headaches.       History of lacunar infarcts. Significant CVA in 2009. Dementia.  Psychiatric/Behavioral: Positive for confusion. Negative for suicidal ideas,  hallucinations, behavioral problems, sleep disturbance, self-injury, dysphoric mood, decreased concentration and agitation. The patient is not nervous/anxious and is not hyperactive.     There were no vitals filed for this visit. There is no weight on file to calculate BMI. Physical Exam  Constitutional: She is oriented to person, place, and time. She appears well-developed. No distress.  HENT:  Head: Normocephalic and atraumatic.  Eyes: Conjunctivae and EOM are normal. Pupils are equal, round, and reactive to light.  Neck: Normal range of motion. Neck supple. No tracheal deviation present. No thyromegaly present.  Cardiovascular: Normal rate and regular rhythm.   Murmur heard. Systolic murmur 2-3/6, left sternal border  Pulmonary/Chest: Effort normal and breath sounds normal. No respiratory distress. She has no wheezes. She has no rales.  Abdominal: Soft. Bowel sounds are normal. She exhibits no distension. There is no tenderness. There is no rebound.  Musculoskeletal: Normal range of motion. She exhibits no edema or tenderness.  Neurological: She is alert and oriented to person, place, and time. She has normal reflexes. She displays normal reflexes. No cranial nerve deficit. She exhibits normal muscle tone. Coordination normal.  Mild tremor at rest.  Skin: Skin is warm and dry. No rash noted. She is not diaphoretic. No erythema. No pallor.  Psychiatric: She has a normal mood and affect. Her behavior is normal. Judgment and thought content normal.    Labs reviewed: Basic Metabolic Panel:  Recent Labs  16/10/96 0335 04/25/16 0424 04/26/16 0433 04/27/16 0404  NA 141 139 141 144  K 2.9* 3.2* 3.6 4.1  CL 107 107 110 113*  CO2 23 22 23 24   GLUCOSE 187* 244* 172* 107*  BUN 28* 18 26* 24*  CREATININE 1.28* 0.84 0.84 0.87  CALCIUM 8.9 8.6* 8.5* 8.6*  MG 2.0 1.8 2.4  --   PHOS 2.4* 2.2* 3.9  --    Liver Function Tests:  Recent Labs  07/27/15 0320 07/28/15 0855 04/23/16 0150    AST 315* 100* 42*  ALT 207* 152* 30  ALKPHOS 63 74 126  BILITOT 0.6 1.2 0.7  PROT 6.7 7.5 8.1  ALBUMIN 2.8* 2.9* 3.9    Recent Labs  07/26/15 1858  LIPASE 64*   No results for input(s): AMMONIA in the last 8760 hours. CBC:  Recent Labs  07/27/15 1336  04/23/16 0150  04/25/16 0424  04/26/16 0433 04/27/16 0404  WBC 21.0*  < > 10.7*  < > 11.8* 10.2 7.7  NEUTROABS 17.7*  --  7.9*  --   --   --   --   HGB 12.6  < > 15.0  < > 11.8* 11.4* 10.8*  HCT 37.8  < > 46.3*  < > 36.5 35.1* 34.0*  MCV 82.4  < > 84.2  < > 80.9 81.3 83.5  PLT 317  < > 260  < > 193 208 227  < > = values in this interval not displayed. Cardiac Enzymes:  Recent Labs  07/26/15 1858 07/27/15 0859 07/27/15 1337 07/27/15 2130 04/23/16 0853  CKTOTAL 157  --   --   --  578*  TROPONINI  --  0.23* 0.24* 0.20*  --    BNP: Invalid input(s): POCBNP Lab Results  Component Value Date   HGBA1C 9.6* 04/24/2016   No results found for: TSH No results found for: VITAMINB12 No results found for: FOLATE No results found for: IRON, TIBC, FERRITIN  Imaging and Procedures obtained prior to SNF admission: Ct Angio Head W/cm &/or Wo Cm  04/23/2016  CLINICAL DATA:  Initial evaluation for acute seizure, right gaze deviation. EXAM: CT ANGIOGRAPHY HEAD AND NECK TECHNIQUE: Multidetector CT imaging of the head and neck was performed using the standard protocol during bolus administration of intravenous contrast. Multiplanar CT image reconstructions and MIPs were obtained to evaluate the vascular anatomy. Carotid stenosis measurements (when applicable) are obtained utilizing NASCET criteria, using the distal internal carotid diameter as the denominator. CONTRAST:  80 cc of Isovue 370. COMPARISON:  Prior noncontrast head CT from earlier same day. FINDINGS: Study is limited by a technical error, with the mid and superior aspect of the the brain not visualized on arterial phase imaging. CTA NECK Aortic arch: Visualized aortic arch of  normal caliber with normal 3 vessel morphology. Scattered calcified plaque within the arch itself and about the origin of the great vessels without flow-limiting stenosis. Scattered calcified plaque within the proximal subclavian arteries with mild narrowing. Subclavian arteries otherwise patent. Right carotid system: Right common carotid artery patent from its origin to the bifurcation. Mild plaque about the proximal right ICA with associated narrowing of approximately 30% by NASCET criteria. Probable small penetrating ulcer present at this level as well. Right ICA patent distally without stenosis, occlusion, or dissection. Multifocal atheromatous irregularity within the right ICA without high-grade stenosis. Right external carotid artery and its branches grossly normal. Left carotid system: Left common carotid artery patent from its origin to the bifurcation. Eccentric calcified plaque about the proximal left ICA with associated narrowing of approximately 30% by NASCET criteria. Distally, left ICA patent to the skullbase without additional stenosis, dissection, or occlusion. Multi focal atheromatous irregularity without high-grade stenosis. Left external carotid artery and its branches within normal limits. Vertebral arteries:Both vertebral arteries arise from the subclavian arteries. Right vertebral artery is dominant. Vertebral arteries patent along their entire course without stenosis, dissection, or occlusion. Skeleton: No acute osseous abnormality. Multilevel degenerative spondylolysis within the cervical spine, most prevalent at C5-6 and C6-7. Well-circumscribed lucent lesion within the C5 vertebral body favored to be benign. Other neck: Partially visualized lungs are clear. Superior mediastinum demonstrates no abnormality. Thyroid gland normal. No significant adenopathy. No acute soft tissue abnormality. Patient is intubated. CTA HEAD Anterior circulation: Petrous segments are widely patent bilaterally.  Scattered calcified atheromatous plaque within the cavernous ICAs without flow-limiting stenosis. Supraclinoid segments widely patent. A1 segments patent. Previous identified  moderate to severe or right A1 and more mild left a 1 stenosis is stable. Right A1 segment is hypoplastic, which likely accounts for the slightly diminutive right ICA as compared to the left. Anterior communicating artery normal. Partially visualized anterior cerebral arteries patent. M1 segments patent without stenosis or occlusion. MCA bifurcations normal. No proximal M2 branch occlusion or stenosis. Distal MCA branches not well evaluated on this exam due to technical error. Probable distal small vessel atheromatous irregularity suspected within the left MCA branches. Posterior circulation: Vertebral arteries patent to the vertebrobasilar junction. Right vertebral artery dominant. Posterior inferior cerebral arteries patent. Basilar artery patent to its distal aspect. Superior cerebral arteries patent bilaterally. Both posterior cerebral arteries arise from the basilar artery and are well opacified to their distal aspects. Venous sinuses: Not well evaluated on this exam due to timing of the contrast bolus and technical error. Anatomic variants: No anatomic variant.  No aneurysm. Delayed phase: Not performed. IMPRESSION: CTA NECK IMPRESSION: 1. No flow limiting or critical stenosis identified within the major arterial vasculature of the neck. 2. Atheromatous plaque at the proximal ICAs with associated stenoses of approximately 30% by NASCET criteria bilaterally. CTA HEAD IMPRESSION: 1. No large or proximal arterial branch occlusion within the intracranial circulation. No high-grade or correctable stenosis. 2. Atheromatous plaque throughout the cavernous ICAs with secondary mild to moderate multi focal irregularity and narrowing. 3. Stable moderate to severe right and more mild proximal left ACA stenoses. A page has been put into the  neurohospitalist on call at the time of this dictation. Electronically Signed   By: Rise Mu M.D.   On: 04/23/2016 04:08   Ct Head Wo Contrast  04/23/2016  CLINICAL DATA:  Code stroke.  Initial encounter. EXAM: CT HEAD WITHOUT CONTRAST TECHNIQUE: Contiguous axial images were obtained from the base of the skull through the vertex without intravenous contrast. COMPARISON:  CT of the head performed 07/27/2015 FINDINGS: There is no evidence of acute infarction, mass lesion, or intra- or extra-axial hemorrhage on CT. Prominence of the ventricles and sulci reflects mild to moderate cortical volume loss. Scattered periventricular and subcortical white matter change likely reflects small vessel ischemic microangiopathy. The brainstem and fourth ventricle are within normal limits. The basal ganglia are unremarkable in appearance. The cerebral hemispheres demonstrate grossly normal gray-white differentiation. No mass effect or midline shift is seen. There is no evidence of fracture; visualized osseous structures are unremarkable in appearance. The orbits are within normal limits. The paranasal sinuses and mastoid air cells are well-aerated. No significant soft tissue abnormalities are seen. IMPRESSION: 1. No acute intracranial pathology seen on CT. 2. Mild to moderate cortical volume loss and scattered small vessel ischemic microangiopathy. These results were called by telephone at the time of interpretation on 04/23/2016 at 2:25 am to Dr. Cena Benton, who verbally acknowledged these results. Electronically Signed   By: Roanna Raider M.D.   On: 04/23/2016 02:25   Ct Angio Neck W/cm &/or Wo/cm  04/23/2016  CLINICAL DATA:  Initial evaluation for acute seizure, right gaze deviation. EXAM: CT ANGIOGRAPHY HEAD AND NECK TECHNIQUE: Multidetector CT imaging of the head and neck was performed using the standard protocol during bolus administration of intravenous contrast. Multiplanar CT image reconstructions and MIPs were  obtained to evaluate the vascular anatomy. Carotid stenosis measurements (when applicable) are obtained utilizing NASCET criteria, using the distal internal carotid diameter as the denominator. CONTRAST:  80 cc of Isovue 370. COMPARISON:  Prior noncontrast head CT from earlier same day. FINDINGS: Study  is limited by a technical error, with the mid and superior aspect of the the brain not visualized on arterial phase imaging. CTA NECK Aortic arch: Visualized aortic arch of normal caliber with normal 3 vessel morphology. Scattered calcified plaque within the arch itself and about the origin of the great vessels without flow-limiting stenosis. Scattered calcified plaque within the proximal subclavian arteries with mild narrowing. Subclavian arteries otherwise patent. Right carotid system: Right common carotid artery patent from its origin to the bifurcation. Mild plaque about the proximal right ICA with associated narrowing of approximately 30% by NASCET criteria. Probable small penetrating ulcer present at this level as well. Right ICA patent distally without stenosis, occlusion, or dissection. Multifocal atheromatous irregularity within the right ICA without high-grade stenosis. Right external carotid artery and its branches grossly normal. Left carotid system: Left common carotid artery patent from its origin to the bifurcation. Eccentric calcified plaque about the proximal left ICA with associated narrowing of approximately 30% by NASCET criteria. Distally, left ICA patent to the skullbase without additional stenosis, dissection, or occlusion. Multi focal atheromatous irregularity without high-grade stenosis. Left external carotid artery and its branches within normal limits. Vertebral arteries:Both vertebral arteries arise from the subclavian arteries. Right vertebral artery is dominant. Vertebral arteries patent along their entire course without stenosis, dissection, or occlusion. Skeleton: No acute osseous  abnormality. Multilevel degenerative spondylolysis within the cervical spine, most prevalent at C5-6 and C6-7. Well-circumscribed lucent lesion within the C5 vertebral body favored to be benign. Other neck: Partially visualized lungs are clear. Superior mediastinum demonstrates no abnormality. Thyroid gland normal. No significant adenopathy. No acute soft tissue abnormality. Patient is intubated. CTA HEAD Anterior circulation: Petrous segments are widely patent bilaterally. Scattered calcified atheromatous plaque within the cavernous ICAs without flow-limiting stenosis. Supraclinoid segments widely patent. A1 segments patent. Previous identified moderate to severe or right A1 and more mild left a 1 stenosis is stable. Right A1 segment is hypoplastic, which likely accounts for the slightly diminutive right ICA as compared to the left. Anterior communicating artery normal. Partially visualized anterior cerebral arteries patent. M1 segments patent without stenosis or occlusion. MCA bifurcations normal. No proximal M2 branch occlusion or stenosis. Distal MCA branches not well evaluated on this exam due to technical error. Probable distal small vessel atheromatous irregularity suspected within the left MCA branches. Posterior circulation: Vertebral arteries patent to the vertebrobasilar junction. Right vertebral artery dominant. Posterior inferior cerebral arteries patent. Basilar artery patent to its distal aspect. Superior cerebral arteries patent bilaterally. Both posterior cerebral arteries arise from the basilar artery and are well opacified to their distal aspects. Venous sinuses: Not well evaluated on this exam due to timing of the contrast bolus and technical error. Anatomic variants: No anatomic variant.  No aneurysm. Delayed phase: Not performed. IMPRESSION: CTA NECK IMPRESSION: 1. No flow limiting or critical stenosis identified within the major arterial vasculature of the neck. 2. Atheromatous plaque at the  proximal ICAs with associated stenoses of approximately 30% by NASCET criteria bilaterally. CTA HEAD IMPRESSION: 1. No large or proximal arterial branch occlusion within the intracranial circulation. No high-grade or correctable stenosis. 2. Atheromatous plaque throughout the cavernous ICAs with secondary mild to moderate multi focal irregularity and narrowing. 3. Stable moderate to severe right and more mild proximal left ACA stenoses. A page has been put into the neurohospitalist on call at the time of this dictation. Electronically Signed   By: Rise Mu M.D.   On: 04/23/2016 04:08   Mr Brain Wo Contrast  04/23/2016  CLINICAL DATA:  Initial evaluation for acute seizure, right gaze deviation. EXAM: MRI HEAD WITHOUT CONTRAST TECHNIQUE: Multiplanar, multiecho pulse sequences of the brain and surrounding structures were obtained without intravenous contrast. COMPARISON:  Prior studies from earlier same day as well as previous brain MRI from 07/27/2015. FINDINGS: Diffuse prominence of the CSF containing spaces is compatible with generalized age-related cerebral atrophy, progressed relative to most recent MRI. Confluent T2/FLAIR hyperintensity involving the periventricular and deep greater than subcortical white matter has also progressed relative to prior. Chronic small vessel ischemic changes present within the pons. Numerous dilated perivascular spaces within the basal ganglia, similar to prior. Remote lacunar infarct with chronic hemosiderin staining present within the body of the right caudate/right internal capsule. Additional small chronic lacunar infarctions within the bilateral thalami. Small chronic micro hemorrhages within the pons as well. Probable additional tiny remote left cerebellar infarct. Mildly hyperintense 9 mm diffusion abnormality present within the right periatrial white matter (series 4, image 26). This likely reflects the sequela of acute/early subacute small vessel ischemia. No  other acute infarct. Gray-white matter differentiation maintained. Major intracranial vascular flow voids are preserved. No acute intracranial hemorrhage. The no mass lesion, midline shift, or mass effect. Ventricular prominence related to global parenchymal volume loss of hydrocephalus. Hippocampi are symmetric in size and appearance with normal signal intensity and morphology. No extra-axial fluid collection. Scattered FLAIR sequence seen throughout the supratentorial brain on this exam favored to related to motion artifact. Craniocervical junction normal. Visualized upper cervical spine unremarkable. 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 No acute abnormality about the orbits. Sequela prior lens extraction noted bilaterally. Fluid within the posterior nasopharynx. Patient is intubated. Paranasal sinuses are otherwise clear. Trace opacity within the right mastoid air cells. Mastoid air cells are otherwise clear. Inner ear structures normal. Bone marrow signal intensity normal. No scalp soft tissue abnormality. IMPRESSION: 1. Mildly hyperintense 9 mm diffusion abnormality within the right periatrial white matter, likely reflecting acute/early subacute small vessel ischemia. 2. No other acute intracranial process. 3. Moderate cerebral atrophy with chronic small vessel ischemic disease, progressed relative to most recent MRI from 7/21 07/27/2015. 4. Chronic hemorrhagic right basal ganglia infarct, with additional chronic lacunar infarcts within the thalami, stable. Electronically Signed   By: Rise Mu M.D.   On: 04/23/2016 05:33   Dg Chest Port 1 View  04/24/2016  CLINICAL DATA:  Respiratory difficulty EXAM: PORTABLE CHEST 1 VIEW COMPARISON:  04/23/2016 FINDINGS: Tubular devices are stable. Patchy bilateral apical pulmonary opacities have increased. Minimal patchy density at the retrocardiac left base. Upper normal heart size. No pneumothorax. Normal vascularity. IMPRESSION: Increased patchy pulmonary  opacities at both lung apices and left base. Considerations include patchy volume loss or patchy airspace disease. Electronically Signed   By: Jolaine Click M.D.   On: 04/24/2016 07:34   Dg Chest Port 1 View  04/23/2016  CLINICAL DATA:  Central line placement. EXAM: PORTABLE CHEST 1 VIEW COMPARISON:  04/23/2016 FINDINGS: Endotracheal tube is 6 cm above the carina. Left central line tip in the SVC. No pneumothorax. NG tube tip in the stomach. Heart is normal size. No confluent airspace opacities or effusions. No acute bony abnormality. IMPRESSION: Support devices as above.  No pneumothorax.  No acute findings. Electronically Signed   By: Charlett Nose M.D.   On: 04/23/2016 13:00   Dg Chest Portable 1 View  04/23/2016  CLINICAL DATA:  Endotracheal tube and orogastric tube placement. Initial encounter. EXAM: PORTABLE CHEST 1 VIEW COMPARISON:  Chest radiograph performed 07/26/2015 FINDINGS: The patient's endotracheal tube is seen ending 1 cm above the carina. This could be retracted 2 cm. The patient's enteric tube is seen extending below the diaphragm. The lungs appear relatively clear. No pleural effusion or pneumothorax is seen. The cardiomediastinal silhouette is normal in size. No acute osseous abnormalities are identified. IMPRESSION: 1. Endotracheal tube seen ending 1 cm above the carina. This could be retracted 2 cm. 2. Enteric tube noted extending below the diaphragm. 3. Lungs clear bilaterally. These results were called by telephone at the time of interpretation on 04/23/2016 at 2:17 am to Dr. [name], who verbally acknowledged these results. Electronically Signed   By: Roanna Raider M.D.   On: 04/23/2016 02:17    Assessment/Plan 1. Status epilepticus (HCC) Resolved. Remains on Keppra for seizure prevention.  2. History of stroke Multiple old lacunar infarcts. Possible new infarct on last MRI of the brain.  3. Type 2 diabetes mellitus with hyperosmolarity without coma, with long-term current  use of insulin (HCC) Started on insulin during hospitalization  4. Acute respiratory failure with hypoxia (HCC) Resolved. Patient was intubated while in status epilepticus.  5. Debility Generalized weakening as result of her recent illness. No focally weak areas that I can determine.  6. Dysphagia Patient remains on a dysphagia 3 diet  7. Abnormality of gait Patient will engage in physical therapy for instruction in safe mobility.  8. Typical atrial flutter (HCC) Resolved.  9. Dementia, without behavioral disturbance Hopefully patient will be and to resume her independent living. Further assessment of this problem will take place during the course of this nursing home admission.

## 2016-05-05 ENCOUNTER — Encounter: Payer: Self-pay | Admitting: Internal Medicine

## 2016-05-05 DIAGNOSIS — F0390 Unspecified dementia without behavioral disturbance: Secondary | ICD-10-CM

## 2016-05-05 DIAGNOSIS — R5381 Other malaise: Secondary | ICD-10-CM

## 2016-05-05 DIAGNOSIS — R1310 Dysphagia, unspecified: Secondary | ICD-10-CM

## 2016-05-05 DIAGNOSIS — I4892 Unspecified atrial flutter: Secondary | ICD-10-CM

## 2016-05-05 DIAGNOSIS — R269 Unspecified abnormalities of gait and mobility: Secondary | ICD-10-CM | POA: Insufficient documentation

## 2016-05-05 HISTORY — DX: Unspecified abnormalities of gait and mobility: R26.9

## 2016-05-05 HISTORY — DX: Dysphagia, unspecified: R13.10

## 2016-05-05 HISTORY — DX: Unspecified atrial flutter: I48.92

## 2016-05-05 HISTORY — DX: Unspecified dementia, unspecified severity, without behavioral disturbance, psychotic disturbance, mood disturbance, and anxiety: F03.90

## 2016-05-05 HISTORY — DX: Other malaise: R53.81

## 2016-05-21 ENCOUNTER — Non-Acute Institutional Stay (SKILLED_NURSING_FACILITY): Payer: Medicare Other | Admitting: Internal Medicine

## 2016-05-21 DIAGNOSIS — R269 Unspecified abnormalities of gait and mobility: Secondary | ICD-10-CM | POA: Diagnosis not present

## 2016-05-21 DIAGNOSIS — I10 Essential (primary) hypertension: Secondary | ICD-10-CM

## 2016-05-21 DIAGNOSIS — R5381 Other malaise: Secondary | ICD-10-CM

## 2016-05-21 DIAGNOSIS — F0390 Unspecified dementia without behavioral disturbance: Secondary | ICD-10-CM | POA: Diagnosis not present

## 2016-05-21 DIAGNOSIS — E1100 Type 2 diabetes mellitus with hyperosmolarity without nonketotic hyperglycemic-hyperosmolar coma (NKHHC): Secondary | ICD-10-CM

## 2016-05-21 DIAGNOSIS — Z794 Long term (current) use of insulin: Secondary | ICD-10-CM | POA: Diagnosis not present

## 2016-05-21 DIAGNOSIS — I5032 Chronic diastolic (congestive) heart failure: Secondary | ICD-10-CM

## 2016-05-21 DIAGNOSIS — G40901 Epilepsy, unspecified, not intractable, with status epilepticus: Secondary | ICD-10-CM | POA: Diagnosis not present

## 2016-05-21 NOTE — Progress Notes (Signed)
Patient ID: Dana Blair, female   DOB: [birth year], 75 y.o.   MRN: [id number]    [facility] Room Number: 113  Place of Service: SNF (31)     No Known Allergies  Chief Complaint  Patient presents with  . Discharge Note    HPI:  Patient is being discharged to her home today. She lives alone. Her son checks in on her.  She was admitted to [facility] 5-17 following hospitalization. She presented in status epilepticus and required intubation. Following stabilization in the hospital she was sent to the skilled nursing facility for strengthening, safety mobility, and increased skills and self-care. She is made improvement and all items. She seems clearer mentally than on her arrival.  Family is inquiring about assistance at home. She will be referred to urgent [facility] for skilled home nursing evaluations and medication management. She will receive physical therapy and occupational therapy. She may need assistance in bathing and other chore provision at home. She has a four-wheel walker already at home that she can use for her continuing unstable gait.  Medications: Patient's Medications  New Prescriptions   No medications on file  Previous Medications   AMLODIPINE (NORVASC) 10 MG TABLET    Take 1 tablet (10 mg total) by mouth daily.   ASPIRIN EC 325 MG TABLET    Take 1 tablet (325 mg total) by mouth daily.   ATORVASTATIN (LIPITOR) 20 MG TABLET    Take 1 tablet (20 mg total) by mouth at bedtime.   FAMOTIDINE (PEPCID) 40 MG/5ML SUSPENSION    Take 2.5 mLs (20 mg total) by mouth 2 (two) times daily.   INSULIN ASPART (NOVOLOG) 100 UNIT/ML INJECTION    5 units with meals for CBG >150-400 10 units  If CBG >401   INSULIN GLARGINE (LANTUS) 100 UNIT/ML INJECTION    Inject 28 Units into the skin daily.   LABETALOL (NORMODYNE) 100 MG TABLET    Take 1 tablet (100 mg total) by mouth 3 (three) times daily.   LEVETIRACETAM (KEPPRA) 1000 MG TABLET    Take 1 tablet  (1,000 mg total) by mouth 2 (two) times daily.   NEBIVOLOL (BYSTOLIC) 5 MG TABLET    Take 1 tablet (5 mg total) by mouth daily.  Modified Medications   No medications on file  Discontinued Medications   No medications on file     Review of Systems  Constitutional: Positive for fatigue. Negative for fever, chills, diaphoresis, activity change, appetite change and unexpected weight change.  HENT: Positive for hearing loss. Negative for congestion, dental problem, drooling, ear discharge, ear pain, facial swelling, mouth sores, nosebleeds, trouble swallowing and voice change.   Eyes: Positive for visual disturbance (Corrective lenses). Negative for photophobia, pain, discharge, redness and itching.  Respiratory: Negative for apnea, cough, choking, chest tightness, shortness of breath, wheezing and stridor.        Required intubation while in status epilepticus.  Cardiovascular: Negative for chest pain, palpitations and leg swelling.  Gastrointestinal: Negative for nausea, vomiting, abdominal pain, diarrhea, constipation and abdominal distention.  Endocrine: Negative for cold intolerance and heat intolerance.  Genitourinary: Negative for dysuria, frequency, flank pain and difficulty urinating.  Musculoskeletal: Positive for gait problem. Negative for myalgias, back pain, joint swelling, arthralgias, neck pain and neck stiffness.  Skin: Negative for color change, pallor, rash and wound.  Neurological: Positive for tremors and seizures. Negative for dizziness, syncope, speech difficulty, weakness, light-headedness, numbness and headaches.  History of lacunar infarcts. Significant CVA in 2009. Dementia.  Psychiatric/Behavioral: Positive for confusion. Negative for suicidal ideas, hallucinations, behavioral problems, sleep disturbance, self-injury, dysphoric mood, decreased concentration and agitation. The patient is not nervous/anxious and is not hyperactive.     Filed Vitals:   05/21/16  1550  BP: 128/76  Pulse: 80  Temp: 99.2 F (37.3 C)  Resp: 20  Height: '5\' 8"'$  (1.727 m)  Weight: 126 lb (57.153 kg)   Wt Readings from Last 3 Encounters:  05/21/16 126 lb (57.153 kg)  05/03/16 126 lb (57.153 kg)  05/02/16 130 lb (58.968 kg)    Body mass index is 19.16 kg/(m^2).  Physical Exam  Constitutional: She is oriented to person, place, and time. She appears well-developed. No distress.  HENT:  Head: Normocephalic and atraumatic.  Eyes: Conjunctivae and EOM are normal. Pupils are equal, round, and reactive to light.  Neck: Normal range of motion. Neck supple. No tracheal deviation present. No thyromegaly present.  Cardiovascular: Normal rate and regular rhythm.   Murmur heard. Systolic murmur 1-0/2, left sternal border  Pulmonary/Chest: Effort normal and breath sounds normal. No respiratory distress. She has no wheezes. She has no rales.  Abdominal: Soft. Bowel sounds are normal. She exhibits no distension. There is no tenderness. There is no rebound.  Musculoskeletal: Normal range of motion. She exhibits no edema or tenderness.  Neurological: She is alert and oriented to person, place, and time. She has normal reflexes. She displays normal reflexes. No cranial nerve deficit. She exhibits normal muscle tone. Coordination normal.  Mild tremor at rest.  Skin: Skin is warm and dry. No rash noted. She is not diaphoretic. No erythema. No pallor.  Psychiatric: She has a normal mood and affect. Her behavior is normal. Judgment and thought content normal.     Labs reviewed: Lab Summary Latest Ref Rng 04/27/2016 04/26/2016 04/25/2016  Hemoglobin 12.0 - 15.0 g/dL 10.8(L) 11.4(L) 11.8(L)  Hematocrit 36.0 - 46.0 % 34.0(L) 35.1(L) 36.5  White count 4.0 - 10.5 K/uL 7.7 10.2 11.8(H)  Platelet count 150 - 400 K/uL 227 208 193  Sodium 135 - 145 mmol/L 144 141 139  Potassium 3.5 - 5.1 mmol/L 4.1 3.6 3.2(L)  Calcium 8.9 - 10.3 mg/dL 8.6(L) 8.5(L) 8.6(L)  Phosphorus 2.5 - 4.6 mg/dL (None)  3.9 2.2(L)  Creatinine 0.44 - 1.00 mg/dL 0.87 0.84 0.84  AST - (None) (None) (None)  Alk Phos - (None) (None) (None)  Bilirubin - (None) (None) (None)  Glucose 65 - 99 mg/dL 107(H) 172(H) 244(H)  Cholesterol 0 - 200 mg/dL (None) (None) 194  HDL cholesterol >40 mg/dL (None) (None) 67  Triglycerides <150 mg/dL (None) (None) 87  LDL Direct - (None) (None) (None)  LDL Calc 0 - 99 mg/dL (None) (None) 110(H)  Total protein - (None) (None) (None)  Albumin - (None) (None) (None)   No results found for: TSH Lab Results  Component Value Date   BUN 24* 04/27/2016   BUN 26* 04/26/2016   BUN 18 04/25/2016   Lab Results  Component Value Date   CREATININE 0.87 04/27/2016   CREATININE 0.84 04/26/2016   CREATININE 0.84 04/25/2016   Lab Results  Component Value Date   HGBA1C 9.6* 04/24/2016   HGBA1C 10.2* 04/23/2016   HGBA1C 6.3* 07/27/2015       Assessment/Plan  1. Debility Patient has improved gradually and strength and mobility. We are requesting home physical therapy and occupational therapy to continue strengthening and safety mobility training.  2. Dementia, without behavioral disturbance  Continue to monitor  3. Essential hypertension Controlled  4. Type 2 diabetes mellitus with hyperosmolarity without coma, with long-term current use of insulin (HCC) Controlled  5. Status epilepticus ([place]) No seizures throughout her stay at [facility]. Chronic diastolic congestive heart failure ([place]) Compensated  7. Abnormality of gait Continue use four-wheel walker

## 2016-05-22 ENCOUNTER — Telehealth: Payer: Self-pay | Admitting: Cardiology

## 2016-05-22 DIAGNOSIS — R2681 Unsteadiness on feet: Secondary | ICD-10-CM | POA: Diagnosis not present

## 2016-05-22 DIAGNOSIS — N189 Chronic kidney disease, unspecified: Secondary | ICD-10-CM | POA: Diagnosis not present

## 2016-05-22 DIAGNOSIS — I130 Hypertensive heart and chronic kidney disease with heart failure and stage 1 through stage 4 chronic kidney disease, or unspecified chronic kidney disease: Secondary | ICD-10-CM | POA: Diagnosis not present

## 2016-05-22 DIAGNOSIS — E1122 Type 2 diabetes mellitus with diabetic chronic kidney disease: Secondary | ICD-10-CM | POA: Diagnosis not present

## 2016-05-22 DIAGNOSIS — I5032 Chronic diastolic (congestive) heart failure: Secondary | ICD-10-CM | POA: Diagnosis not present

## 2016-05-22 DIAGNOSIS — I4891 Unspecified atrial fibrillation: Secondary | ICD-10-CM | POA: Diagnosis not present

## 2016-05-22 NOTE — Telephone Encounter (Signed)
Agree with plan Mark Skains, MD  

## 2016-05-22 NOTE — Telephone Encounter (Signed)
I spoke with Wes (PT) and he is currently assessing the pt.  I asked him to give the pt a few minutes and recheck her BP since her BP has otherwise been okay while in rehab facility.     I called Wes back and upon recheck the pt's BP was 184/94.  The pt is taking bystolic 5mg  daily and amlodipine 10mg  daily. The pt is not taking Labetalol which was given in the hospital. The pt's PCP Dr Wynelle LinkSun is aware of elevated BP and they did not recommend any changes at this time and they are in the process of arranging a follow-up appointment. PT and nursing will be seeing the pt multiple times per week and will check vitals.  I recommended that they keep a log of the pt's readings to take to PCP.  Wes contacted our office because Dr Anne FuSkains prescribes Bystolic.  I advised that they continue to monitor pt's BP and contact PCP if it remains elevated.

## 2016-05-22 NOTE — Telephone Encounter (Signed)
New message     Pt c/o BP issue: STAT if pt c/o blurred vision, one-sided weakness or slurred speech  1. What are your last 5 BP readings? 192/94 HR 66 2. Are you having any other symptoms (ex. Dizziness, headache, blurred vision, passed out)?no 3. What is your BP issue?  Calling to give high bp reading and get any order from the doctor.  Home health nurse will be at patient's home another 20 min.

## 2016-05-24 ENCOUNTER — Ambulatory Visit (INDEPENDENT_AMBULATORY_CARE_PROVIDER_SITE_OTHER): Payer: Medicare Other | Admitting: Neurology

## 2016-05-24 ENCOUNTER — Encounter: Payer: Self-pay | Admitting: Neurology

## 2016-05-24 VITALS — BP 124/78 | HR 65 | Ht 66.0 in | Wt 133.0 lb

## 2016-05-24 DIAGNOSIS — G40201 Localization-related (focal) (partial) symptomatic epilepsy and epileptic syndromes with complex partial seizures, not intractable, with status epilepticus: Secondary | ICD-10-CM

## 2016-05-24 DIAGNOSIS — E1111 Type 2 diabetes mellitus with ketoacidosis with coma: Secondary | ICD-10-CM

## 2016-05-24 DIAGNOSIS — E1311 Other specified diabetes mellitus with ketoacidosis with coma: Secondary | ICD-10-CM | POA: Diagnosis not present

## 2016-05-24 DIAGNOSIS — I610 Nontraumatic intracerebral hemorrhage in hemisphere, subcortical: Secondary | ICD-10-CM | POA: Diagnosis not present

## 2016-05-24 DIAGNOSIS — G250 Essential tremor: Secondary | ICD-10-CM

## 2016-05-24 DIAGNOSIS — G40209 Localization-related (focal) (partial) symptomatic epilepsy and epileptic syndromes with complex partial seizures, not intractable, without status epilepticus: Secondary | ICD-10-CM | POA: Diagnosis not present

## 2016-05-24 DIAGNOSIS — Z794 Long term (current) use of insulin: Secondary | ICD-10-CM

## 2016-05-24 MED ORDER — LEVETIRACETAM 500 MG PO TABS: 1000.0000 mg | ORAL_TABLET | Freq: Two times a day (BID) | ORAL | Status: DC

## 2016-05-24 NOTE — Progress Notes (Signed)
Chart forwarded.  

## 2016-05-24 NOTE — Progress Notes (Signed)
NEUROLOGY CONSULTATION NOTE  Scharlene GlossBarbara J Travelstead MRN: [id number] DOB: [birth year]  Referring provider: Hospital referral Primary care provider: Dr. Chilton SiGreen  Reason for consult:  seizure  HISTORY OF PRESENT ILLNESS: Dana Blair is a 75 year old right-handed woman with hypertension, CKD, hyperlipidemia, and history of prior ischemic strokes and left basal ganglia hemorrhage who presents for recent status epilepticus.  History obtained by patient, her son and hospital notes.    On 04/23/16, she was found by her family seizing.  Her son reports that her whole body was convulsing in flexor posturing, but hospital notes mention tonic clonic movements of the right side with right gaze deviation.  She reportedly had not been taking her medications.  EMS arrived and she was intubated as she was desaturating in the 70s and received 5mg  Versed en route.  CT and CTA of head and neck were negative.  She was sedated with propofol and loaded with Keppra and Vimpat.  She was also found to be in atrial fibrillation, which was new, as well as DKA.  Overnight EEG showed slowing with focal slowing in the left hemisphere, but no seizures.  MRI of brain showed small acute/early subacute right subcortical ischemic infarct, which was likely incidental.  2D echo showed EF 60-65%.  LDL was 110 and Hgb A1c was 9.6.  She was extubated and was discharged on Keppra 1000mg  twice daily.  ASA 81mg  was switched to 325mg  daily.  She was started on Lipitor 20mg  daily.  She exhibited some dysphagia.  She was evaluated by speech therapy and placed on a dysphagia 2 diet.   She was discharged to nursing home.  PAST MEDICAL HISTORY: Past Medical History  Diagnosis Date  . Diabetes (HCC)     Diabetes mellitus with chronic kidney disease   . Hypertension     [facility]- ECHO 06/29/09 - LVH, normal EF, renal ultrasound-no renal artery stenosis  . Hyperlipidemia   . Stroke [facility](HCC)     Right caudate stroke (6/09) - also had ICH. She had  concomitant respiratory failure. On May 22, 2008, CT of the head without contrast demonstrated a 9 x 20 mm acute hematoma within the head of the right caudate with intraventricular extension of hemorrhage. Neurosurgery consult-no ventriculostomy.  . Glaucoma   . Osteopenia   . Chronic kidney disease   . Syncope 07/26/2015  . Acute respiratory failure (HCC)   . Epilepsy with status epilepticus, not intractable (HCC)   . Elevated white blood cell count   . Unspecified severe protein-calorie malnutrition (HCC)   . Dehydration   . Acidosis   . Atrioventricular bloc first degree   . Chronic diastolic heart failure (HCC)   . Orthostatic hypotension   . Hydronephrosis   . Debility 05/05/2016  . Dysphagia 05/05/2016  . Abnormality of gait 05/05/2016  . Atrial flutter (HCC) 05/05/2016  . Dementia 05/05/2016    PAST SURGICAL HISTORY: Past Surgical History  Procedure Laterality Date  . Abdominal hysterectomy    . Flexible sigmoidoscopy N/A 07/30/2015    Procedure: FLEXIBLE SIGMOIDOSCOPY;  Surgeon: Dorena CookeyJohn Hayes, MD;  [facility];  Service: Endoscopy;  Laterality: N/A;  2 fleets enemas at 7:30 AM on July 30    MEDICATIONS: Current Outpatient Prescriptions on File Prior to Visit  Medication Sig Dispense Refill  . amLODipine (NORVASC) 10 MG tablet Take 1 tablet (10 mg total) by mouth daily.    Marland Kitchen. aspirin EC 325 MG tablet Take 1 tablet (325 mg total) by mouth daily. 30  tablet 0  . atorvastatin (LIPITOR) 20 MG tablet Take 1 tablet (20 mg total) by mouth at bedtime.    . famotidine (PEPCID) 40 MG/5ML suspension Take 2.5 mLs (20 mg total) by mouth 2 (two) times daily. 50 mL 0  . insulin aspart (NOVOLOG) 100 UNIT/ML injection 5 units with meals for CBG >150-400 10 units  If CBG >401    . insulin glargine (LANTUS) 100 UNIT/ML injection Inject 28 Units into the skin daily.    . nebivolol (BYSTOLIC) 5 MG tablet Take 1 tablet (5 mg total) by mouth daily. 30 tablet 6   No current facility-administered  medications on file prior to visit.    ALLERGIES: No Known Allergies  FAMILY HISTORY: Family History  Problem Relation Age of Onset  . Diabetes Mother   . Diabetes Father   . Diabetes Sister     SOCIAL HISTORY: Social History   Social History  . Marital Status: Married    Spouse Name: N/A  . Number of Children: N/A  . Years of Education: N/A   Occupational History  . retail, [employer]    Social History Main Topics  . Smoking status: Never Smoker   . Smokeless tobacco: Never Used  . Alcohol Use: No  . Drug Use: No  . Sexual Activity: No   Other Topics Concern  . Not on file   Social History Narrative   Lives at [facility] since 05/01/16   Widow   Never smoked   No advance directives       REVIEW OF SYSTEMS: Constitutional: No fevers, chills, or sweats, no generalized fatigue, change in appetite Eyes: No visual changes, double vision, eye pain Ear, nose and throat: No hearing loss, ear pain, nasal congestion, sore throat Cardiovascular: No chest pain, palpitations Respiratory:  No shortness of breath at rest or with exertion, wheezes GastrointestinaI: No nausea, vomiting, diarrhea, abdominal pain, fecal incontinence Genitourinary:  No dysuria, urinary retention or frequency Musculoskeletal:  No neck pain, back pain Integumentary: No rash, pruritus, skin lesions Neurological: as above Psychiatric: No depression, insomnia, anxiety Endocrine: No palpitations, fatigue, diaphoresis, mood swings, change in appetite, change in weight, increased thirst Hematologic/Lymphatic:  No purpura, petechiae. Allergic/Immunologic: no itchy/runny eyes, nasal congestion, recent allergic reactions, rashes  PHYSICAL EXAM: Filed Vitals:   05/24/16 1257  BP: 124/78  Pulse: 65   General: No acute distress.  Patient appears well-groomed.  Head:  Normocephalic/atraumatic Eyes:  fundi examined but not visualized Neck: supple, no paraspinal tenderness, full range of motion Back:  No paraspinal tenderness Heart: regular rate and rhythm Lungs: Clear to auscultation bilaterally. Vascular: No carotid bruits. Neurological Exam: Mental status: alert and oriented to person, place, and time, recent and remote memory intact, fund of knowledge intact, attention and concentration intact, speech fluent and not dysarthric, language intact. Cranial nerves: CN I: not tested CN II: pupils equal, round and reactive to light, visual fields intact CN III, IV, VI:  full range of motion, no nystagmus, no ptosis CN V: facial sensation intact CN VII: upper and lower face symmetric CN VIII: hearing intact CN IX, X: gag intact, uvula midline CN XI: sternocleidomastoid and trapezius muscles intact CN XII: tongue midline Bulk & Tone: normal, no fasciculations. Motor:  5/5 throughout.  Head tremor and kinetic tremor in hands noted. Sensation: temperature and vibration sensation intact. Deep Tendon Reflexes:  2+ throughout, toes downgoing.  Finger to nose testing:  Without dysmetria.  Heel to shin:  Without dysmetria.  Gait:  Cautious station  and stride.  Able to turn, unable to tandem walk. Romberg negative.  IMPRESSION: 1.  Status epilepticus, possibly triggered by metabolic abnormalities.  However, since she exhibited focal slowing on EEG, which may have been a seizure focus and correlates with her semiology, I would continue anti-epileptic medication. 2.  Cerebrovascular disease 3.  History of left basal ganglia hemorrhage 4.  Essential tremor  PLAN: Keppra 1000mg  twice daily for seizure prevention ASA 325mg  daily for secondary stroke prevention Lipitor 20mg  daily and recheck fasting lipid panel prior to follow up (LDL goal should be less than 70). Glycemic control Blood pressure control Home Health is involved Son is monitoring her medications. She does not drive.  Thank you for allowing me to take part in the care of this patient.  Shon Millet, DO  CC:  Murray Hodgkins,  MD

## 2016-05-24 NOTE — Patient Instructions (Signed)
1.  Continue Keppra 1000mg  twice daily.  Will prescribe 500mg  tablets, so take 2 tablets twice daily 2.  Aspirin 325mg  daily 3.  Atorvastatin 20mg  daily.  Repeat fasting lipid panel prior to follow up. 4.  Blood pressure and diabetes control 5.  Follow up in 3 months.

## 2016-06-26 ENCOUNTER — Other Ambulatory Visit: Payer: Self-pay | Admitting: Internal Medicine

## 2016-08-13 ENCOUNTER — Telehealth: Payer: Self-pay

## 2016-08-13 DIAGNOSIS — E785 Hyperlipidemia, unspecified: Secondary | ICD-10-CM

## 2016-08-13 NOTE — Telephone Encounter (Signed)
-----   Message from Richarda OverlieJada A Fox, [place] sent at 05/24/2016  1:35 PM EDT ----- Fasting lipid

## 2016-08-13 NOTE — Telephone Encounter (Signed)
Left message for son, [name], to call back to schedule patient for a lab only draw.

## 2016-09-06 ENCOUNTER — Ambulatory Visit: Payer: Medicare Other | Admitting: Neurology

## 2016-09-06 DIAGNOSIS — Z029 Encounter for administrative examinations, unspecified: Secondary | ICD-10-CM

## 2016-09-28 ENCOUNTER — Encounter: Payer: Self-pay | Admitting: Neurology

## 2016-09-28 ENCOUNTER — Ambulatory Visit (INDEPENDENT_AMBULATORY_CARE_PROVIDER_SITE_OTHER): Payer: Medicare Other | Admitting: Neurology

## 2016-09-28 VITALS — BP 142/96 | HR 68 | Ht 63.0 in | Wt 120.3 lb

## 2016-09-28 DIAGNOSIS — E1311 Other specified diabetes mellitus with ketoacidosis with coma: Secondary | ICD-10-CM

## 2016-09-28 DIAGNOSIS — G40209 Localization-related (focal) (partial) symptomatic epilepsy and epileptic syndromes with complex partial seizures, not intractable, without status epilepticus: Secondary | ICD-10-CM

## 2016-09-28 DIAGNOSIS — F0390 Unspecified dementia without behavioral disturbance: Secondary | ICD-10-CM | POA: Diagnosis not present

## 2016-09-28 DIAGNOSIS — G250 Essential tremor: Secondary | ICD-10-CM | POA: Diagnosis not present

## 2016-09-28 DIAGNOSIS — G40201 Localization-related (focal) (partial) symptomatic epilepsy and epileptic syndromes with complex partial seizures, not intractable, with status epilepticus: Secondary | ICD-10-CM

## 2016-09-28 DIAGNOSIS — I10 Essential (primary) hypertension: Secondary | ICD-10-CM

## 2016-09-28 DIAGNOSIS — Z8673 Personal history of transient ischemic attack (TIA), and cerebral infarction without residual deficits: Secondary | ICD-10-CM

## 2016-09-28 DIAGNOSIS — E1111 Type 2 diabetes mellitus with ketoacidosis with coma: Secondary | ICD-10-CM

## 2016-09-28 DIAGNOSIS — Z794 Long term (current) use of insulin: Secondary | ICD-10-CM

## 2016-09-28 NOTE — Progress Notes (Signed)
NEUROLOGY FOLLOW UP OFFICE NOTE  Dana Blair Blair  HISTORY OF PRESENT ILLNESS: Dana DeedsBarbara Blair is a 75 year old right-handed woman with hypertension, CKD, hyperlipidemia, and history of prior ischemic strokes and left basal ganglia hemorrhage who follows up for prior status epilepticus.  UPDATE: She is taking Keppra 1000mg  twice daily.  She has not had any recurrent seizure.  She does carry a diagnosis of dementia but was never put on medication.  She lives with her son.  Her son has noticed gradually progressed short term memory problems over the past few years.  Recently, she had two episodes of confusion.  One time, she began talking about a boy for about an hour.  She once started cleaning the house in the middle of the night.  She once left the stove on.  Her son monitors her medication and cooks for her.  She does not drive.  He has not had change in behavior, personality or exhibited combativeness.  She has not exhibited hallucinations or delusions.  There is no family history of dementia.  HISTORY: On 04/23/16, she was found by her family seizing.  Her son reports that her whole body was convulsing in flexor posturing, but hospital notes mention tonic clonic movements of the right side with right gaze deviation.  She reportedly had not been taking her medications.  EMS arrived and she was intubated as she was desaturating in the 70s and received 5mg  Versed en route.  CT and CTA of head and neck were negative.  She was sedated with propofol and loaded with Keppra and Vimpat.  She was also found to be in atrial fibrillation, which was new, as well as DKA.  Overnight EEG showed slowing with focal slowing in the left hemisphere, but no seizures.  MRI of brain showed small acute/early subacute right subcortical ischemic infarct, which was likely incidental.  2D echo showed EF 60-65%.  LDL was 110 and Hgb A1c was 9.6.  She was extubated and was discharged on Keppra 1000mg  twice daily.   ASA 81mg  was switched to 325mg  daily.  She was started on Lipitor 20mg  daily.  She exhibited some dysphagia.  She was evaluated by speech therapy and placed on a dysphagia 2 diet.   She was discharged to nursing home.  PAST MEDICAL HISTORY: Past Medical History:  Diagnosis Date  . Abnormality of gait 05/05/2016  . Acidosis   . Acute respiratory failure (HCC)   . Atrial flutter (HCC) 05/05/2016  . Atrioventricular bloc first degree   . Chronic diastolic heart failure (HCC)   . Chronic kidney disease   . Debility 05/05/2016  . Dehydration   . Dementia 05/05/2016  . Diabetes (HCC)    Diabetes mellitus with chronic kidney disease   . Dysphagia 05/05/2016  . Elevated white blood cell count   . Epilepsy with status epilepticus, not intractable (HCC)   . Glaucoma   . Hydronephrosis   . Hyperlipidemia   . Hypertension    [facility]- [facility] 06/29/09 - LVH, normal EF, renal ultrasound-no renal artery stenosis  . Orthostatic hypotension   . Osteopenia   . Stroke [facility](HCC)    Right caudate stroke (6/09) - also had ICH. She had concomitant respiratory failure. On May 22, 2008, CT of the head without contrast demonstrated a 9 x 20 mm acute hematoma within the head of the right caudate with intraventricular extension of hemorrhage. Neurosurgery consult-no ventriculostomy.  . Syncope 07/26/2015  . Unspecified severe protein-calorie malnutrition (HCC)  MEDICATIONS: Current Outpatient Prescriptions on File Prior to Visit  Medication Sig Dispense Refill  . amLODipine (NORVASC) 10 MG tablet TAKE ONE TABLET BY MOUTH ONCE DAILY TO CONTROL BLOOD PRESSURE 30 tablet 2  . aspirin EC 325 MG tablet Take 1 tablet (325 mg total) by mouth daily. 30 tablet 0  . atorvastatin (LIPITOR) 20 MG tablet TAKE ONE TABLET BY MOUTH ONCE DAILY TO  LOWER  CHOLESTEROL 30 tablet 2  . famotidine (PEPCID) 40 MG/5ML suspension Take 2.5 mLs (20 mg total) by mouth 2 (two) times daily. 50 mL 0  . insulin aspart (NOVOLOG) 100 UNIT/ML injection 5  units with meals for CBG >150-400 10 units  If CBG >401    . insulin glargine (LANTUS) 100 UNIT/ML injection Inject 28 Units into the skin daily.    Marland Kitchen levETIRAcetam (KEPPRA) 500 MG tablet Take 2 tablets (1,000 mg total) by mouth 2 (two) times daily. 120 tablet 5  . nebivolol (BYSTOLIC) 5 MG tablet Take 1 tablet (5 mg total) by mouth daily. 30 tablet 6   No current facility-administered medications on file prior to visit.     ALLERGIES: No Known Allergies  FAMILY HISTORY: Family History  Problem Relation Age of Onset  . Diabetes Mother   . Diabetes Father   . Diabetes Sister     SOCIAL HISTORY: Social History   Social History  . Marital status: Married    Spouse name: N/A  . Number of children: N/A  . Years of education: N/A   Occupational History  . retail, [employer]    Social History Main Topics  . Smoking status: Never Smoker  . Smokeless tobacco: Never Used  . Alcohol use No  . Drug use: No  . Sexual activity: No   Other Topics Concern  . Not on file   Social History Narrative   Lives at [place] since 05/01/16   Widow   Never smoked   No advance directives       REVIEW OF SYSTEMS: Constitutional: No fevers, chills, or sweats, no generalized fatigue, change in appetite Eyes: No visual changes, double vision, eye pain Ear, nose and throat: No hearing loss, ear pain, nasal congestion, sore throat Cardiovascular: No chest pain, palpitations Respiratory:  No shortness of breath at rest or with exertion, wheezes GastrointestinaI: No nausea, vomiting, diarrhea, abdominal pain, fecal incontinence Genitourinary:  No dysuria, urinary retention or frequency Musculoskeletal:  No neck pain, back pain Integumentary: No rash, pruritus, skin lesions Neurological: as above Psychiatric: No depression, insomnia, anxiety Endocrine: No palpitations, fatigue, diaphoresis, mood swings, change in appetite, change in weight, increased thirst Hematologic/Lymphatic:  No  purpura, petechiae. Allergic/Immunologic: no itchy/runny eyes, nasal congestion, recent allergic reactions, rashes  PHYSICAL EXAM: Vitals:   09/28/16 1343  BP: (!) 142/96  Pulse: 68   General: No acute distress.  Patient appears well-groomed.  normal body habitus. Head:  Normocephalic/atraumatic Eyes:  Fundi examined but not visualized Neck: supple, no paraspinal tenderness, full range of motion Heart:  Regular rate and rhythm Lungs:  Clear to auscultation bilaterally Back: No paraspinal tenderness Neurological Exam: alert and oriented to person, place, and day of week and year Attention span and concentration intact, recent memory poor, and remote memory intact, fund of knowledge intact.  Speech fluent and not dysarthric, language intact.   MMSE - Mini Mental State Exam 09/28/2016  Orientation to time 2  Orientation to Place 2  Registration 3  Attention/ Calculation 0  Recall 0  Language- name 2 objects  2  Language- repeat 1  Language- follow 3 step command 2  Language- read & follow direction 1  Write a sentence 1  Copy design 0  Total score 14   CN II-XII intact. Bulk and tone normal, muscle strength 5/5 throughout. Tremor. Sensation to light touch  intact.  Deep tendon reflexes 1+ throughout.  Finger to nose testing intact.  Gait normal, Romberg negative.  IMPRESSION: 1.  Status epilepticus, possibly triggered by metabolic abnormalities.  However, since she exhibited focal slowing on EEG, which may have been a seizure focus and correlates with her semiology, I would continue anti-epileptic medication. 2.  Cerebrovascular disease 3.  History of left basal ganglia hemorrhage 4.  Dementia 5.  Essential tremor 6.  HTN 6.  Diabetes mellitus  PLAN: Will start Namenda XR titrating to 28mg  daily (will not start Aricept due to seizures).  Discussed home safety and establishing power of attorney. Keppra 1000mg  twice daily for seizure prevention ASA 325mg  daily for secondary  stroke prevention Lipitor 20mg  daily and recheck fasting lipid panel (LDL goal should be less than 70). Glycemic control Blood pressure control (follow up with PCP) Home Health is involved Son is monitoring her medications. She does not drive.  28 minutes spent face to face with patient, over 50% spent counseling.  Shon Millet, DO  CC:  Murray Hodgkins, MD

## 2016-09-28 NOTE — Progress Notes (Signed)
Chart forwarded.  

## 2016-09-28 NOTE — Patient Instructions (Signed)
Continue Lipitor 20mg  daily and aspirin 325mg  daily.  Check fasting lipid panel. Continue leviteracetam 1000mg  twice daily Start memantine (Namenda) as prescribed. Follow up in 6 months.

## 2016-10-04 ENCOUNTER — Other Ambulatory Visit: Payer: Self-pay

## 2016-10-04 ENCOUNTER — Encounter: Payer: Self-pay | Admitting: Neurology

## 2016-10-04 ENCOUNTER — Telehealth: Payer: Self-pay | Admitting: Neurology

## 2016-10-04 MED ORDER — LEVETIRACETAM 500 MG PO TABS: 1000.0000 mg | ORAL_TABLET | Freq: Two times a day (BID) | ORAL | 5 refills | Status: DC

## 2016-10-04 NOTE — Telephone Encounter (Signed)
Prescription sent to pharmacy.

## 2016-10-04 NOTE — Telephone Encounter (Signed)
Benancio DeedsBarbara Clenney 2041/07/28. Her Son [name] called wanting to see about her medication not being at the pharmacy. She was seen last week by Dr. Everlena CooperJaffe.  She uses [facility] [address]. Her son's # is [phone]. Thank you

## 2016-12-11 ENCOUNTER — Other Ambulatory Visit: Payer: Self-pay | Admitting: Cardiology

## 2016-12-11 ENCOUNTER — Other Ambulatory Visit: Payer: Self-pay

## 2016-12-11 MED ORDER — MEMANTINE HCL ER 28 MG PO CP24: 28.0000 mg | ORAL_CAPSULE | Freq: Every day | ORAL | 3 refills | Status: DC

## 2016-12-11 MED ORDER — NEBIVOLOL HCL 5 MG PO TABS: 5.0000 mg | ORAL_TABLET | Freq: Every day | ORAL | 0 refills | Status: DC

## 2016-12-11 MED ORDER — AMLODIPINE BESYLATE 10 MG PO TABS: ORAL_TABLET | ORAL | 0 refills | Status: DC

## 2016-12-28 ENCOUNTER — Observation Stay (HOSPITAL_COMMUNITY)
Admission: EM | Admit: 2016-12-28 | Discharge: 2017-01-01 | Disposition: A | Discharge status: Home / Self Care | Payer: Medicare Other | Attending: Family Medicine | Admitting: Family Medicine

## 2016-12-28 ENCOUNTER — Encounter (HOSPITAL_COMMUNITY): Payer: Self-pay | Admitting: Neurology

## 2016-12-28 DIAGNOSIS — E1100 Type 2 diabetes mellitus with hyperosmolarity without nonketotic hyperglycemic-hyperosmolar coma (NKHHC): Secondary | ICD-10-CM

## 2016-12-28 DIAGNOSIS — N183 Chronic kidney disease, stage 3 (moderate): Secondary | ICD-10-CM | POA: Diagnosis not present

## 2016-12-28 DIAGNOSIS — N179 Acute kidney failure, unspecified: Secondary | ICD-10-CM | POA: Insufficient documentation

## 2016-12-28 DIAGNOSIS — R5381 Other malaise: Secondary | ICD-10-CM | POA: Diagnosis not present

## 2016-12-28 DIAGNOSIS — K219 Gastro-esophageal reflux disease without esophagitis: Secondary | ICD-10-CM | POA: Insufficient documentation

## 2016-12-28 DIAGNOSIS — I2510 Atherosclerotic heart disease of native coronary artery without angina pectoris: Secondary | ICD-10-CM | POA: Insufficient documentation

## 2016-12-28 DIAGNOSIS — K5909 Other constipation: Principal | ICD-10-CM | POA: Insufficient documentation

## 2016-12-28 DIAGNOSIS — K5900 Constipation, unspecified: Secondary | ICD-10-CM

## 2016-12-28 DIAGNOSIS — K6289 Other specified diseases of anus and rectum: Secondary | ICD-10-CM | POA: Insufficient documentation

## 2016-12-28 DIAGNOSIS — G40901 Epilepsy, unspecified, not intractable, with status epilepticus: Secondary | ICD-10-CM | POA: Diagnosis not present

## 2016-12-28 DIAGNOSIS — N1330 Unspecified hydronephrosis: Secondary | ICD-10-CM | POA: Insufficient documentation

## 2016-12-28 DIAGNOSIS — E876 Hypokalemia: Secondary | ICD-10-CM | POA: Diagnosis not present

## 2016-12-28 DIAGNOSIS — I951 Orthostatic hypotension: Secondary | ICD-10-CM | POA: Insufficient documentation

## 2016-12-28 DIAGNOSIS — I4892 Unspecified atrial flutter: Secondary | ICD-10-CM | POA: Diagnosis not present

## 2016-12-28 DIAGNOSIS — E860 Dehydration: Secondary | ICD-10-CM | POA: Diagnosis not present

## 2016-12-28 DIAGNOSIS — I5032 Chronic diastolic (congestive) heart failure: Secondary | ICD-10-CM | POA: Diagnosis present

## 2016-12-28 DIAGNOSIS — E43 Unspecified severe protein-calorie malnutrition: Secondary | ICD-10-CM | POA: Insufficient documentation

## 2016-12-28 DIAGNOSIS — E785 Hyperlipidemia, unspecified: Secondary | ICD-10-CM | POA: Diagnosis present

## 2016-12-28 DIAGNOSIS — R269 Unspecified abnormalities of gait and mobility: Secondary | ICD-10-CM | POA: Insufficient documentation

## 2016-12-28 DIAGNOSIS — M8580 Other specified disorders of bone density and structure, unspecified site: Secondary | ICD-10-CM | POA: Insufficient documentation

## 2016-12-28 DIAGNOSIS — R569 Unspecified convulsions: Secondary | ICD-10-CM

## 2016-12-28 DIAGNOSIS — E872 Acidosis: Secondary | ICD-10-CM | POA: Insufficient documentation

## 2016-12-28 DIAGNOSIS — D72829 Elevated white blood cell count, unspecified: Secondary | ICD-10-CM | POA: Diagnosis not present

## 2016-12-28 DIAGNOSIS — E1122 Type 2 diabetes mellitus with diabetic chronic kidney disease: Secondary | ICD-10-CM | POA: Insufficient documentation

## 2016-12-28 DIAGNOSIS — N3001 Acute cystitis with hematuria: Secondary | ICD-10-CM | POA: Diagnosis present

## 2016-12-28 DIAGNOSIS — R1310 Dysphagia, unspecified: Secondary | ICD-10-CM | POA: Diagnosis not present

## 2016-12-28 DIAGNOSIS — I440 Atrioventricular block, first degree: Secondary | ICD-10-CM | POA: Insufficient documentation

## 2016-12-28 DIAGNOSIS — F0390 Unspecified dementia without behavioral disturbance: Secondary | ICD-10-CM | POA: Diagnosis present

## 2016-12-28 DIAGNOSIS — Z79899 Other long term (current) drug therapy: Secondary | ICD-10-CM | POA: Insufficient documentation

## 2016-12-28 DIAGNOSIS — K625 Hemorrhage of anus and rectum: Secondary | ICD-10-CM

## 2016-12-28 DIAGNOSIS — Z8673 Personal history of transient ischemic attack (TIA), and cerebral infarction without residual deficits: Secondary | ICD-10-CM | POA: Insufficient documentation

## 2016-12-28 DIAGNOSIS — D649 Anemia, unspecified: Secondary | ICD-10-CM | POA: Insufficient documentation

## 2016-12-28 DIAGNOSIS — I130 Hypertensive heart and chronic kidney disease with heart failure and stage 1 through stage 4 chronic kidney disease, or unspecified chronic kidney disease: Secondary | ICD-10-CM | POA: Insufficient documentation

## 2016-12-28 DIAGNOSIS — Z794 Long term (current) use of insulin: Secondary | ICD-10-CM

## 2016-12-28 DIAGNOSIS — I10 Essential (primary) hypertension: Secondary | ICD-10-CM | POA: Diagnosis present

## 2016-12-28 DIAGNOSIS — E119 Type 2 diabetes mellitus without complications: Secondary | ICD-10-CM

## 2016-12-28 DIAGNOSIS — I700 Atherosclerosis of aorta: Secondary | ICD-10-CM | POA: Insufficient documentation

## 2016-12-28 DIAGNOSIS — H409 Unspecified glaucoma: Secondary | ICD-10-CM | POA: Insufficient documentation

## 2016-12-28 DIAGNOSIS — Z7982 Long term (current) use of aspirin: Secondary | ICD-10-CM | POA: Insufficient documentation

## 2016-12-28 DIAGNOSIS — Z833 Family history of diabetes mellitus: Secondary | ICD-10-CM | POA: Insufficient documentation

## 2016-12-28 LAB — CBC
HEMATOCRIT: 42 % (ref 36.0–46.0)
HEMOGLOBIN: 14.1 g/dL (ref 12.0–15.0)
MCH: 28 pg (ref 26.0–34.0)
MCHC: 33.6 g/dL (ref 30.0–36.0)
MCV: 83.3 fL (ref 78.0–100.0)
Platelets: 245 10*3/uL (ref 150–400)
RBC: 5.04 MIL/uL (ref 3.87–5.11)
RDW: 14 % (ref 11.5–15.5)
WBC: 10.9 10*3/uL — ABNORMAL HIGH (ref 4.0–10.5)

## 2016-12-28 LAB — COMPREHENSIVE METABOLIC PANEL
ALBUMIN: 4.2 g/dL (ref 3.5–5.0)
ALT: 15 U/L (ref 14–54)
ANION GAP: 17 — AB (ref 5–15)
AST: 29 U/L (ref 15–41)
Alkaline Phosphatase: 82 U/L (ref 38–126)
BILIRUBIN TOTAL: 1.3 mg/dL — AB (ref 0.3–1.2)
BUN: 29 mg/dL — ABNORMAL HIGH (ref 6–20)
CO2: 22 mmol/L (ref 22–32)
Calcium: 10 mg/dL (ref 8.9–10.3)
Chloride: 100 mmol/L — ABNORMAL LOW (ref 101–111)
Creatinine, Ser: 1.26 mg/dL — ABNORMAL HIGH (ref 0.44–1.00)
GFR calc non Af Amer: 41 mL/min — ABNORMAL LOW (ref >60)
GFR, EST AFRICAN AMERICAN: 47 mL/min — AB (ref >60)
GLUCOSE: 164 mg/dL — AB (ref 65–99)
POTASSIUM: 3.7 mmol/L (ref 3.5–5.1)
SODIUM: 139 mmol/L (ref 135–145)
TOTAL PROTEIN: 8.4 g/dL — AB (ref 6.5–8.1)

## 2016-12-28 LAB — TYPE AND SCREEN
ABO/RH(D): O POS
ANTIBODY SCREEN: NEGATIVE

## 2016-12-28 LAB — POC OCCULT BLOOD, ED: FECAL OCCULT BLD: POSITIVE — AB

## 2016-12-28 LAB — ABO/RH: ABO/RH(D): O POS

## 2016-12-28 MED ORDER — IOPAMIDOL (ISOVUE-300) INJECTION 61%
INTRAVENOUS | Status: AC
  Filled 2016-12-28: qty 30

## 2016-12-28 NOTE — ED Provider Notes (Signed)
[facility]-EMERGENCY DEPT Provider Note   CSN: [id number] Arrival date & time: 12/28/16  1637     History   Chief Complaint Chief Complaint  Patient presents with  . Rectal Bleeding    HPI Dana Blair is a 75 y.o. female.  Dana Blair is a 75 y.o. female with h/o dementia, atrial flutter, diastolic HF, CKD, HLD, HTN, T2DM, osteopenia, 1st degree AV block presents to ED with complaint of rectal bleeding. Pt is poor historian secondary to dementia. Son noted today bloody stool soiled through house gown, he also noted bloody stool in sheets. Patient notes blood in stool off/on for last week. Patient endorses associated rectal pressure/pain and constipation - endorses hard pellets/pebbles for stool and straining during bowel movement. Per son, patient has had intermittent constipation for the last several months. He has given castor oil with minimal relief. Pt also complains of dysuria. Denies fever, chest pain, SOB, nausea, vomiting, abdominal pain, hematuria, LOC, or any other new/concerning symptoms. No h/o GI bleeds. No h/o hemorrhoids. Takes ASA daily, no other blood thinners. No NSAID use, pepto bismol intake, or iron supplementation.    Level V - caveat      Past Medical History:  Diagnosis Date  . Abnormality of gait 05/05/2016  . Acidosis   . Acute respiratory failure (HCC)   . Atrial flutter (HCC) 05/05/2016  . Atrioventricular bloc first degree   . Chronic diastolic heart failure (HCC)   . Chronic kidney disease   . Debility 05/05/2016  . Dehydration   . Dementia 05/05/2016  . Diabetes (HCC)    Diabetes mellitus with chronic kidney disease   . Dysphagia 05/05/2016  . Elevated white blood cell count   . Epilepsy with status epilepticus, not intractable (HCC)   . Glaucoma   . Hydronephrosis   . Hyperlipidemia   . Hypertension    [facility]- ECHO 06/29/09 - LVH, normal EF, renal ultrasound-no renal artery stenosis  . Orthostatic hypotension   . Osteopenia   . Stroke [facility])     Right caudate stroke (6/09) - also had ICH. She had concomitant respiratory failure. On May 22, 2008, CT of the head without contrast demonstrated a 9 x 20 mm acute hematoma within the head of the right caudate with intraventricular extension of hemorrhage. Neurosurgery consult-no ventriculostomy.  . Syncope 07/26/2015  . Unspecified severe protein-calorie malnutrition [facility])     Patient Active Problem List   Diagnosis Date Noted  . Debility 05/05/2016  . Dysphagia 05/05/2016  . Abnormality of gait 05/05/2016  . Atrial flutter (HCC) 05/05/2016  . Dementia 05/05/2016  . GERD (gastroesophageal reflux disease) 05/02/2016  . Status epilepticus (HCC) 04/23/2016  . Encounter for central line placement   . Hydronephrosis   . Severe protein-calorie malnutrition (HCC) 07/28/2015  . Syncope and collapse   . Chronic diastolic congestive heart failure (HCC)   . LVH (left ventricular hypertrophy)   . Orthostatic hypotension   . Lactic acidosis   . AKI (acute kidney injury) (HCC) 07/26/2015  . Dehydration 07/26/2015  . Essential hypertension 08/13/2014  . Hyperlipidemia 08/13/2014  . History of stroke 08/13/2014  . DM2 (diabetes mellitus, type 2) (HCC) 08/13/2014  . Mild mitral regurgitation 08/13/2014  . 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 ABDOMINAL HYSTERECTOMY    . FLEXIBLE SIGMOIDOSCOPY N/A 07/30/2015   Procedure: FLEXIBLE SIGMOIDOSCOPY;  Surgeon: Dorena Cookey, MD;  Location[facility] END[facility]COPY;  Service: Endoscopy;  Laterality: N/A;  2 fleets enemas at 7:30 AM on July 30    OB History    No data available       Home Medications    Prior to Admission medications   Medication Sig Start Date End Date Taking? Authorizing Provider  amLODipine (NORVASC) 10 MG tablet TAKE ONE TABLET BY MOUTH ONCE DAILY TO CONTROL BLOOD PRESSURE 12/11/16  Yes [name], MD  aspirin EC 325 MG tablet Take 1 tablet (325 mg total) by mouth daily. 05/01/16   Yes Ripudeep Jenna Luo, MD  atorvastatin (LIPITOR) 20 MG tablet TAKE ONE TABLET BY MOUTH ONCE DAILY TO  LOWER  CHOLESTEROL 06/26/16  Yes Kimber Relic, MD  levETIRAcetam (KEPPRA) 500 MG tablet Take 2 tablets (1,000 mg total) by mouth 2 (two) times daily. 10/04/16  Yes Adam Mliss Fritz, DO  memantine (NAMENDA XR) 28 MG CP24 24 hr capsule Take 1 capsule (28 mg total) by mouth daily. 12/11/16  Yes Adam Mliss Fritz, DO  nebivolol (BYSTOLIC) 5 MG tablet Take 1 tablet (5 mg total) by mouth daily. 12/11/16  Yes [name], MD  SITagliptin Phosphate (JANUVIA PO) Take 1 tablet by mouth daily.   Yes Historical Provider, MD  famotidine (PEPCID) 40 MG/5ML suspension Take 2.5 mLs (20 mg total) by mouth 2 (two) times daily. Patient not taking: Reported on 12/29/2016 05/01/16   Ripudeep Jenna Luo, MD    Family History Family History  Problem Relation Age of Onset  . Diabetes Mother   . Diabetes Father   . Diabetes Sister     Social History Social History  Substance Use Topics  . Smoking status: Never Smoker  . Smokeless tobacco: Never Used  . Alcohol use No     Allergies   Patient has no known allergies.   Review of Systems Review of Systems  Constitutional: Negative for chills, diaphoresis and fever.  HENT: Negative for trouble swallowing.   Eyes: Negative for visual disturbance.  Respiratory: Negative for shortness of breath.   Cardiovascular: Negative for chest pain.  Gastrointestinal: Positive for anal bleeding, constipation and rectal pain. Negative for abdominal pain, blood in stool, diarrhea, nausea and vomiting.  Genitourinary: Positive for dysuria. Negative for hematuria.  Musculoskeletal: Negative for arthralgias and myalgias.  Skin: Negative for rash.  Neurological: Negative for dizziness, syncope and light-headedness.     Physical Exam Updated Vital Signs BP 171/98 (BP Location: Right Arm)   Pulse 85   Temp 98.4 F (36.9 C) (Oral)   Resp 17   SpO2 100%   Physical Exam    Constitutional: She appears well-developed and well-nourished. No distress.  HENT:  Head: Normocephalic and atraumatic.  Mouth/Throat: Oropharynx is clear and moist. No oropharyngeal exudate.  Eyes: Conjunctivae and EOM are normal. Pupils are equal, round, and reactive to light. Right eye exhibits no discharge. Left eye exhibits no discharge. No scleral icterus.  Neck: Normal range of motion and phonation normal. Neck supple. No neck rigidity. Normal range of motion present.  Cardiovascular: Normal rate, regular rhythm, normal heart sounds and intact distal pulses.   No murmur heard. Pulmonary/Chest: Effort normal and breath sounds normal. No stridor. No respiratory distress. She has no wheezes. She has no rales.  Abdominal: Soft. Bowel sounds are normal. She exhibits no distension. There is no tenderness. There is no rigidity, no rebound, no guarding and no CVA tenderness.  Genitourinary:  Genitourinary Comments: Chaperone present for duration of exam. No external hemorrhoids or fissures noted. Rectal tone is normal. Diffuse  tenderness on rectal exam. No masses palpated. Stool noted in rectal vault. Brown right blood and brown stool noted on DRE.  Musculoskeletal: Normal range of motion.  Lymphadenopathy:    She has no cervical adenopathy.  Neurological: She is alert. She is not disoriented. Coordination and gait normal. GCS eye subscore is 4. GCS verbal subscore is 5. GCS motor subscore is 6.  Skin: Skin is warm and dry. She is not diaphoretic.  Psychiatric: She has a normal mood and affect. Her behavior is normal.     ED Treatments / Results  Labs (all labs ordered are listed, but only abnormal results are displayed) Labs Reviewed  COMPREHENSIVE METABOLIC PANEL - Abnormal; Notable for the following:       Result Value   Chloride 100 (*)    Glucose, Bld 164 (*)    BUN 29 (*)    Creatinine, Ser 1.26 (*)    Total Protein 8.4 (*)    Total Bilirubin 1.3 (*)    GFR calc non Af Amer 41  (*)    GFR calc Af Amer 47 (*)    Anion gap 17 (*)    All other components within normal limits  CBC - Abnormal; Notable for the following:    WBC 10.9 (*)    All other components within normal limits  URINALYSIS, ROUTINE W REFLEX MICROSCOPIC - Abnormal; Notable for the following:    Color, Urine AMBER (*)    APPearance CLOUDY (*)    Hgb urine dipstick LARGE (*)    Ketones, ur 20 (*)    Protein, ur 100 (*)    Leukocytes, UA LARGE (*)    Bacteria, UA MANY (*)    Squamous Epithelial / LPF 0-5 (*)    All other components within normal limits  POC OCCULT BLOOD, ED - Abnormal; Notable for the following:    Fecal Occult Bld POSITIVE (*)    All other components within normal limits  TYPE AND SCREEN  ABO/RH    EKG  EKG Interpretation None       Radiology Ct Abdomen Pelvis Wo Contrast  Result Date: 12/29/2016 CLINICAL DATA:  Subacute onset of constipation, with 10 pounds of weight loss. Rectal pressure and hematochezia. Initial encounter. EXAM: CT ABDOMEN AND PELVIS WITHOUT CONTRAST TECHNIQUE: Multidetector CT imaging of the abdomen and pelvis was performed following the standard protocol without IV contrast. COMPARISON:  CT of the abdomen and pelvis from 07/28/2015, and renal ultrasound performed 08/01/2015 FINDINGS: Lower chest: Scattered coronary artery calcification is seen. Minimal left basilar atelectasis or scarring is seen. Hepatobiliary: A 1.7 cm hypodensity within the right hepatic lobe likely reflects a cyst. Smaller nonspecific hypodensities are seen within the liver. The liver is otherwise unremarkable. The gallbladder is difficult fully assess, but appears grossly unremarkable. The common bile duct remains normal in caliber. Pancreas: The pancreas is unremarkable in appearance. Spleen: The spleen is diminutive and grossly unremarkable. Adrenals/Urinary Tract: The adrenal glands are unremarkable in appearance. The kidneys are within normal limits. There is no evidence of  hydronephrosis. No renal or ureteral stones are seen. No perinephric stranding is appreciated. Stomach/Bowel: The stomach is unremarkable in appearance. The small bowel is within normal limits. The appendix is not visualized; there is no evidence for appendicitis. Scattered diverticulosis is noted along the ascending colon, without evidence of diverticulitis. There is a large bolus of stool at the rectum, with diffuse soft tissue inflammation noted about the rectum, and mild associated wall thickening, compatible with proctitis. There  is no definite evidence of perforation. Vascular/Lymphatic: Diffuse calcification is seen along the abdominal aorta and its branches. The abdominal aorta is otherwise grossly unremarkable. The inferior vena cava is grossly unremarkable. No retroperitoneal lymphadenopathy is seen. No pelvic sidewall lymphadenopathy is identified. Reproductive: The bladder is mildly distended and grossly unremarkable. The patient is status post hysterectomy. A 3.2 cm cystic focus is noted at the right adnexa; this is relatively stable from 2016 and likely reflects the normal right ovary. The left ovary is slightly smaller and grossly unremarkable. Other: No additional soft tissue abnormalities are seen. Musculoskeletal: No acute osseous abnormalities are identified. Mild facet disease is noted along the lower lumbar spine. The visualized musculature is unremarkable in appearance. IMPRESSION: 1. Large bolus of stool at the rectum, with diffuse soft tissue inflammation about the rectum, and mild associated wall thickening, compatible with proctitis. No evidence of perforation at this time. 2. Diffuse aortic atherosclerosis. 3. Nonspecific hypodensities within the liver, the largest of which likely reflects a cyst. 4. Scattered coronary artery calcifications seen. 5. Scattered diverticulosis along the ascending colon, without evidence of diverticulitis. 6. 3.2 cm cystic focus at the right adnexa is  relatively stable from 2016 and likely reflects the normal right ovary. Electronically Signed   By: Roanna RaiderJeffery  Chang M.D.   On: 12/29/2016 00:56    Procedures Procedures (including critical care time)  Medications Ordered in ED Medications  iopamidol (ISOVUE-300) 61 % injection (not administered)  ciprofloxacin (CIPRO) IVPB 400 mg (400 mg Intravenous New Bag/Given 12/29/16 0204)  sodium chloride 0.9 % bolus 500 mL (500 mLs Intravenous New Bag/Given 12/29/16 0104)     Initial Impression / Assessment and Plan / ED Course  I have reviewed the triage vital signs and the nursing notes.  Pertinent labs & imaging results that were available during my care of the patient were reviewed by me and considered in my medical decision making (see chart for details).  Clinical Course as of Dec 29 299  Sat Dec 29, 2016  0108 CT Abdomen Pelvis Wo Contrast [AM]    Clinical Course User Index [AM] Lona KettleAshley Laurel Tonesha Tsou, PA-C    Patient presents to ED with complaint of intermittent rectal bleeding x 1 week with rectal pain and dysuria. Patient is afebrile and non-toxic appearing in NAD. Vital signs remarkable for hypertension. Heart RRR. Lungs CTABL. Abdomen soft, non-tender, non-distended. Diffuse tenderness with bright red blood and brown stool on DRE. Review of records show pt had sigmoidoscopy performed in 7/16 and found to have an ulceration in distal rectum with exudate, bx taken at time were negative for malignancy.   Pt hemoccult positive, type and screen completed. Hemoglobin stable. Mild leukocytosis - will obtain CT abd/pelvis to further assess for intraabdominal pathology. CMP remarkable for AKI and elevate BUN. Mild hyperglycemia with AG; ketones present in urine - ?DKA vs. dehydration. U/A remarkable for UTI. Gentle hydration initiated given h/o CHF.  CT scan remarkable for proctitis without evidence of perforation; diverticulosis present without diverticulitis - ?source of rectal bleeding. IV ABX  initiated. Given +hemoccult, CT findings, UTI, and AKI will consult hospitalist for admission. Discussed pt with Dr. Eudelia Bunchardama who also evaluated patient, agrees with plan.   [facility], greatly appreciate her time and input. Agree to admit patient further further management of proctitis, UTI, AKI, and GI bleed. Requests consult with General Surgery regarding need for possible surgical disimpaction given CT findings. Consult to general surgery placed. Thank you for your continued care  of this patient.   Spoke with Dr. Sheliah HatchKinsinger of General Surgery, greatly appreciate his time and input. Acknowledged consult. Thank you for your continued care.   Final Clinical Impressions(s) / ED Diagnoses   Final diagnoses:  Rectal bleeding  Proctitis  Acute cystitis with hematuria  Acute kidney injury [facility](HCC)    New Prescriptions New Prescriptions   No medications on file     Lona Kettleshley Laurel Sylvester Salonga, PA-C 12/29/16 0300    Lona KettleAshley Laurel Askari Kinley, PA-C 12/29/16 0422    Nira ConnPedro Eduardo Cardama, MD 01/01/17 [phone]

## 2016-12-28 NOTE — ED Notes (Signed)
ED Provider at bedside. 

## 2016-12-28 NOTE — ED Triage Notes (Signed)
Pt here with her son. Has had several months of constipation. Yesterday her son noticed son blood in her clothing and on her sheets. Pt reports pressure to rectum. Pt is poor historian. Has had 10 lb weight loss since summer.

## 2016-12-29 ENCOUNTER — Observation Stay (HOSPITAL_COMMUNITY): Payer: Medicare Other

## 2016-12-29 ENCOUNTER — Emergency Department (HOSPITAL_COMMUNITY): Payer: Medicare Other

## 2016-12-29 DIAGNOSIS — K6289 Other specified diseases of anus and rectum: Secondary | ICD-10-CM | POA: Diagnosis not present

## 2016-12-29 DIAGNOSIS — R569 Unspecified convulsions: Secondary | ICD-10-CM

## 2016-12-29 LAB — URINALYSIS, ROUTINE W REFLEX MICROSCOPIC
BILIRUBIN URINE: NEGATIVE
GLUCOSE, UA: NEGATIVE mg/dL
KETONES UR: 20 mg/dL — AB
Nitrite: NEGATIVE
PROTEIN: 100 mg/dL — AB
Specific Gravity, Urine: 1.024 (ref 1.005–1.030)
pH: 5 (ref 5.0–8.0)

## 2016-12-29 LAB — CBG MONITORING, ED: Glucose-Capillary: 109 mg/dL — ABNORMAL HIGH (ref 65–99)

## 2016-12-29 LAB — HEMOGLOBIN AND HEMATOCRIT, BLOOD
HCT: 37.6 % (ref 36.0–46.0)
HCT: 39.2 % (ref 36.0–46.0)
HCT: 42 % (ref 36.0–46.0)
HCT: 35.4 % — ABNORMAL LOW (ref 36.0–46.0)
HEMOGLOBIN: 12.8 g/dL (ref 12.0–15.0)
HEMOGLOBIN: 12 g/dL (ref 12.0–15.0)
Hemoglobin: 12.5 g/dL (ref 12.0–15.0)
Hemoglobin: 14.2 g/dL (ref 12.0–15.0)

## 2016-12-29 LAB — LACTIC ACID, PLASMA
LACTIC ACID, VENOUS: 0.9 mmol/L (ref 0.5–1.9)
Lactic Acid, Venous: 1.3 mmol/L (ref 0.5–1.9)

## 2016-12-29 LAB — GLUCOSE, CAPILLARY
GLUCOSE-CAPILLARY: 125 mg/dL — AB (ref 65–99)
GLUCOSE-CAPILLARY: 101 mg/dL — AB (ref 65–99)

## 2016-12-29 MED ORDER — MILK AND MOLASSES ENEMA
1.0000 | Freq: Once | RECTAL | Status: AC
  Administered 2016-12-29: 250 mL via RECTAL
  Filled 2016-12-29: qty 250

## 2016-12-29 MED ORDER — MEMANTINE HCL ER 28 MG PO CP24: 28.0000 mg | ORAL_CAPSULE | Freq: Every day | ORAL | Status: DC

## 2016-12-29 MED ORDER — SODIUM CHLORIDE 0.9 % IV BOLUS (SEPSIS): 1000.0000 mL | Freq: Once | INTRAVENOUS | Status: DC

## 2016-12-29 MED ORDER — MAGNESIUM CITRATE PO SOLN: 1.0000 | Freq: Once | ORAL | Status: DC | PRN

## 2016-12-29 MED ORDER — CIPROFLOXACIN IN D5W 400 MG/200ML IV SOLN
400.0000 mg | Freq: Two times a day (BID) | INTRAVENOUS | Status: DC
  Administered 2016-12-29 – 2016-12-31 (×4): 400 mg via INTRAVENOUS
  Filled 2016-12-29 (×4): qty 200

## 2016-12-29 MED ORDER — METRONIDAZOLE 500 MG PO TABS
500.0000 mg | ORAL_TABLET | Freq: Three times a day (TID) | ORAL | Status: DC
  Administered 2016-12-29 – 2017-01-01 (×10): 500 mg via ORAL
  Filled 2016-12-29 (×10): qty 1

## 2016-12-29 MED ORDER — MEMANTINE HCL ER 28 MG PO CP24
28.0000 mg | ORAL_CAPSULE | Freq: Every day | ORAL | Status: DC
  Administered 2016-12-30 – 2017-01-01 (×3): 28 mg via ORAL
  Filled 2016-12-29 (×3): qty 1

## 2016-12-29 MED ORDER — NEBIVOLOL HCL 5 MG PO TABS: 5.0000 mg | ORAL_TABLET | Freq: Every day | ORAL | Status: DC

## 2016-12-29 MED ORDER — ASPIRIN EC 325 MG PO TBEC
325.0000 mg | DELAYED_RELEASE_TABLET | Freq: Every day | ORAL | Status: DC
  Administered 2016-12-29 – 2017-01-01 (×4): 325 mg via ORAL
  Filled 2016-12-29 (×4): qty 1

## 2016-12-29 MED ORDER — POLYETHYLENE GLYCOL 3350 17 G PO PACK
17.0000 g | PACK | Freq: Two times a day (BID) | ORAL | Status: DC | PRN
  Administered 2016-12-30: 17 g via ORAL
  Filled 2016-12-29: qty 1

## 2016-12-29 MED ORDER — ONDANSETRON HCL 4 MG PO TABS
4.0000 mg | ORAL_TABLET | Freq: Four times a day (QID) | ORAL | Status: DC | PRN
Start: 2016-12-29 — End: 2017-01-01

## 2016-12-29 MED ORDER — AMLODIPINE BESYLATE 10 MG PO TABS
10.0000 mg | ORAL_TABLET | Freq: Every day | ORAL | Status: DC
  Administered 2016-12-30 – 2017-01-01 (×3): 10 mg via ORAL
  Filled 2016-12-29 (×3): qty 1

## 2016-12-29 MED ORDER — BISACODYL 10 MG RE SUPP: 10.0000 mg | Freq: Every day | RECTAL | Status: DC | PRN

## 2016-12-29 MED ORDER — HYDROCODONE-ACETAMINOPHEN 5-325 MG PO TABS
1.0000 | ORAL_TABLET | ORAL | Status: DC | PRN
  Administered 2016-12-30: 1 via ORAL
  Filled 2016-12-29: qty 1

## 2016-12-29 MED ORDER — SENNOSIDES-DOCUSATE SODIUM 8.6-50 MG PO TABS: 1.0000 | ORAL_TABLET | Freq: Every evening | ORAL | Status: DC | PRN

## 2016-12-29 MED ORDER — SODIUM CHLORIDE 0.9 % IV SOLN
INTRAVENOUS | Status: DC
Start: 2016-12-29 — End: 2017-01-01
  Administered 2016-12-29 – 2017-01-01 (×3): via INTRAVENOUS

## 2016-12-29 MED ORDER — SODIUM CHLORIDE 0.9 % IV BOLUS (SEPSIS)
500.0000 mL | Freq: Once | INTRAVENOUS | Status: AC
  Administered 2016-12-29: 500 mL via INTRAVENOUS

## 2016-12-29 MED ORDER — CIPROFLOXACIN IN D5W 400 MG/200ML IV SOLN
400.0000 mg | INTRAVENOUS | Status: AC
  Administered 2016-12-29: 400 mg via INTRAVENOUS
  Filled 2016-12-29: qty 200

## 2016-12-29 MED ORDER — NEBIVOLOL HCL 5 MG PO TABS
5.0000 mg | ORAL_TABLET | Freq: Every day | ORAL | Status: DC
Start: 2016-12-30 — End: 2017-01-01
  Administered 2016-12-30 – 2017-01-01 (×3): 5 mg via ORAL
  Filled 2016-12-29 (×2): qty 2
  Filled 2016-12-29 (×3): qty 1
  Filled 2016-12-29: qty 2

## 2016-12-29 MED ORDER — TRAZODONE HCL 50 MG PO TABS: 25.0000 mg | ORAL_TABLET | Freq: Every evening | ORAL | Status: DC | PRN

## 2016-12-29 MED ORDER — ACETAMINOPHEN 650 MG RE SUPP: 650.0000 mg | Freq: Four times a day (QID) | RECTAL | Status: DC | PRN

## 2016-12-29 MED ORDER — ONDANSETRON HCL 4 MG/2ML IJ SOLN: 4.0000 mg | Freq: Four times a day (QID) | INTRAMUSCULAR | Status: DC | PRN

## 2016-12-29 MED ORDER — LEVETIRACETAM 500 MG PO TABS
1000.0000 mg | ORAL_TABLET | Freq: Two times a day (BID) | ORAL | Status: DC
  Administered 2016-12-30 – 2017-01-01 (×5): 1000 mg via ORAL
  Filled 2016-12-29 (×5): qty 2

## 2016-12-29 MED ORDER — ACETAMINOPHEN 325 MG PO TABS
650.0000 mg | ORAL_TABLET | Freq: Four times a day (QID) | ORAL | Status: DC | PRN
  Filled 2016-12-29: qty 2

## 2016-12-29 MED ORDER — LEVETIRACETAM 500 MG PO TABS: 1000.0000 mg | ORAL_TABLET | Freq: Two times a day (BID) | ORAL | Status: DC

## 2016-12-29 MED ORDER — INSULIN ASPART 100 UNIT/ML ~~LOC~~ SOLN
0.0000 [IU] | Freq: Three times a day (TID) | SUBCUTANEOUS | Status: DC
  Administered 2016-12-31 – 2017-01-01 (×4): 1 [IU] via SUBCUTANEOUS
  Administered 2017-01-01: 2 [IU] via SUBCUTANEOUS

## 2016-12-29 MED ORDER — ATORVASTATIN CALCIUM 20 MG PO TABS
20.0000 mg | ORAL_TABLET | Freq: Every day | ORAL | Status: DC
  Administered 2016-12-29 – 2017-01-01 (×4): 20 mg via ORAL
  Filled 2016-12-29 (×4): qty 1

## 2016-12-29 NOTE — Consult Note (Signed)
Reason for Consult:rectal bleeding Referring Physician: Varney Biles  Dana Blair is an 75 y.o. female.  HPI: 75 yo female presents with 1 month of rectal bleeding. She is unsure if this happened before. The area has become very painful all the time and she has difficulty with bowel movements due to pain. She denies nausea, vomiting, or abdominal pain. She previously had a bowel movement every day but currently has them less frequently and they are more soft.  Past Medical History:  Diagnosis Date  . Abnormality of gait 05/05/2016  . Acidosis   . Acute respiratory failure ([place])   . Atrial flutter ([place]) 05/05/2016  . Atrioventricular bloc first degree   . Chronic diastolic heart failure ([place])   . Chronic kidney disease   . Debility 05/05/2016  . Dehydration   . Dementia 05/05/2016  . Diabetes ([place])    Diabetes mellitus with chronic kidney disease   . Dysphagia 05/05/2016  . Elevated white blood cell count   . Epilepsy with status epilepticus, not intractable ([place])   . Glaucoma   . Hydronephrosis   . Hyperlipidemia   . Hypertension    [facility]- ECHO 06/29/09 - LVH, normal EF, renal ultrasound-no renal artery stenosis  . Orthostatic hypotension   . Osteopenia   . 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 She had concomitant respiratory failure. On May 22, 2008, CT of the head without contrast demonstrated a 9 x 20 mm acute hematoma within the head of the right caudate with intraventricular extension of hemorrhage. Neurosurgery consult-no ventriculostomy.  . Syncope 07/26/2015  . Unspecified severe protein-calorie malnutrition ([place])     Past Surgical History:  Procedure Laterality Date  . ABDOMINAL HYSTERECTOMY    . FLEXIBLE SIGMOIDOSCOPY N/A 07/30/2015   Procedure: FLEXIBLE SIGMOIDOSCOPY;  Surgeon: Teena Irani, MD;  Location[facility] ENDOSCOPY;  Service: Endoscopy;  Laterality: N/A;  2 fleets enemas at 7:30 AM on July 30    Family History  Problem Relation Age of Onset  .  Diabetes Mother   . Diabetes Father   . Diabetes Sister     Social History:  reports that she has never smoked. She has never used smokeless tobacco. She reports that she does not drink alcohol or use drugs.  Allergies: No Known Allergies  Medications: I have reviewed the patient's current medications.  Results for orders placed or performed during the hospital encounter of 12/28/16 (from the past 48 hour(s))  Type and screen [place]     Status: None   Collection Time: 12/28/16  6:00 PM  Result Value Ref Range   ABO/RH(D) O POS    Antibody Screen NEG    Sample Expiration 12/31/2016   ABO/Rh     Status: None   Collection Time: 12/28/16  6:00 PM  Result Value Ref Range   ABO/RH(D) O POS   Comprehensive metabolic panel     Status: Abnormal   Collection Time: 12/28/16  6:20 PM  Result Value Ref Range   Sodium 139 135 - 145 mmol/L   Potassium 3.7 3.5 - 5.1 mmol/L   Chloride 100 (L) 101 - 111 mmol/L   CO2 22 22 - 32 mmol/L   Glucose, Bld 164 (H) 65 - 99 mg/dL   BUN 29 (H) 6 - 20 mg/dL   Creatinine, Ser 1.26 (H) 0.44 - 1.00 mg/dL   Calcium 10.0 8.9 - 10.3 mg/dL   Total Protein 8.4 (H) 6.5 - 8.1 g/dL  Albumin 4.2 3.5 - 5.0 g/dL   AST 29 15 - 41 U/L   ALT 15 14 - 54 U/L   Alkaline Phosphatase 82 38 - 126 U/L   Total Bilirubin 1.3 (H) 0.3 - 1.2 mg/dL   GFR calc non Af Amer 41 (L) >60 mL/min   GFR calc Af Amer 47 (L) >60 mL/min    Comment: (NOTE) The eGFR has been calculated using the CKD EPI equation. This calculation has not been validated in all clinical situations. eGFR's persistently <60 mL/min signify possible Chronic Kidney Disease.    Anion gap 17 (H) 5 - 15  CBC     Status: Abnormal   Collection Time: 12/28/16  6:20 PM  Result Value Ref Range   WBC 10.9 (H) 4.0 - 10.5 K/uL   RBC 5.04 3.87 - 5.11 MIL/uL   Hemoglobin 14.1 12.0 - 15.0 g/dL   HCT 42.0 36.0 - 46.0 %   MCV 83.3 78.0 - 100.0 fL   MCH 28.0 26.0 - 34.0 pg   MCHC 33.6 30.0 - 36.0  g/dL   RDW 14.0 11.5 - 15.5 %   Platelets 245 150 - 400 K/uL  POC occult blood, ED     Status: Abnormal   Collection Time: 12/28/16  9:56 PM  Result Value Ref Range   Fecal Occult Bld POSITIVE (A) NEGATIVE  Urinalysis, Routine w reflex microscopic     Status: Abnormal   Collection Time: 12/28/16 11:43 PM  Result Value Ref Range   Color, Urine AMBER (A) YELLOW    Comment: BIOCHEMICALS MAY BE AFFECTED BY COLOR   APPearance CLOUDY (A) CLEAR   Specific Gravity, Urine 1.024 1.005 - 1.030   pH 5.0 5.0 - 8.0   Glucose, UA NEGATIVE NEGATIVE mg/dL   Hgb urine dipstick LARGE (A) NEGATIVE   Bilirubin Urine NEGATIVE NEGATIVE   Ketones, ur 20 (A) NEGATIVE mg/dL   Protein, ur 100 (A) NEGATIVE mg/dL   Nitrite NEGATIVE NEGATIVE   Leukocytes, UA LARGE (A) NEGATIVE   RBC / HPF TOO NUMEROUS TO COUNT 0 - 5 RBC/hpf   WBC, UA TOO NUMEROUS TO COUNT 0 - 5 WBC/hpf   Bacteria, UA MANY (A) NONE SEEN   Squamous Epithelial / LPF 0-5 (A) NONE SEEN   Mucous PRESENT   Hemoglobin and hematocrit, blood     Status: None   Collection Time: 12/29/16  3:44 AM  Result Value Ref Range   Hemoglobin 12.5 12.0 - 15.0 g/dL   HCT 37.6 36.0 - 46.0 %    Ct Abdomen Pelvis Wo Contrast  Result Date: 12/29/2016 CLINICAL DATA:  Subacute onset of constipation, with 10 pounds of weight loss. Rectal pressure and hematochezia. Initial encounter. EXAM: CT ABDOMEN AND PELVIS WITHOUT CONTRAST TECHNIQUE: Multidetector CT imaging of the abdomen and pelvis was performed following the standard protocol without IV contrast. COMPARISON:  CT of the abdomen and pelvis from 07/28/2015, and renal ultrasound performed 08/01/2015 FINDINGS: Lower chest: Scattered coronary artery calcification is seen. Minimal left basilar atelectasis or scarring is seen. Hepatobiliary: A 1.7 cm hypodensity within the right hepatic lobe likely reflects a cyst. Smaller nonspecific hypodensities are seen within the liver. The liver is otherwise unremarkable. The  gallbladder is difficult fully assess, but appears grossly unremarkable. The common bile duct remains normal in caliber. Pancreas: The pancreas is unremarkable in appearance. Spleen: The spleen is diminutive and grossly unremarkable. Adrenals/Urinary Tract: The adrenal glands are unremarkable in appearance. The kidneys are within normal limits. There is no  evidence of hydronephrosis. No renal or ureteral stones are seen. No perinephric stranding is appreciated. Stomach/Bowel: The stomach is unremarkable in appearance. The small bowel is within normal limits. The appendix is not visualized; there is no evidence for appendicitis. Scattered diverticulosis is noted along the ascending colon, without evidence of diverticulitis. There is a large bolus of stool at the rectum, with diffuse soft tissue inflammation noted about the rectum, and mild associated wall thickening, compatible with proctitis. There is no definite evidence of perforation. Vascular/Lymphatic: Diffuse calcification is seen along the abdominal aorta and its branches. The abdominal aorta is otherwise grossly unremarkable. The inferior vena cava is grossly unremarkable. No retroperitoneal lymphadenopathy is seen. No pelvic sidewall lymphadenopathy is identified. Reproductive: The bladder is mildly distended and grossly unremarkable. The patient is status post hysterectomy. A 3.2 cm cystic focus is noted at the right adnexa; this is relatively stable from 2016 and likely reflects the normal right ovary. The left ovary is slightly smaller and grossly unremarkable. Other: No additional soft tissue abnormalities are seen. Musculoskeletal: No acute osseous abnormalities are identified. Mild facet disease is noted along the lower lumbar spine. The visualized musculature is unremarkable in appearance. IMPRESSION: 1. Large bolus of stool at the rectum, with diffuse soft tissue inflammation about the rectum, and mild associated wall thickening, compatible with  proctitis. No evidence of perforation at this time. 2. Diffuse aortic atherosclerosis. 3. Nonspecific hypodensities within the liver, the largest of which likely reflects a cyst. 4. Scattered coronary artery calcifications seen. 5. Scattered diverticulosis along the ascending colon, without evidence of diverticulitis. 6. 3.2 cm cystic focus at the right adnexa is relatively stable from 2016 and likely reflects the normal right ovary. Electronically Signed   By: Garald Balding M.D.   On: 12/29/2016 00:56    Review of Systems  Constitutional: Negative for chills and fever.  HENT: Negative for hearing loss.   Eyes: Negative for blurred vision and double vision.  Respiratory: Negative for cough and hemoptysis.   Cardiovascular: Negative for chest pain and palpitations.  Gastrointestinal: Positive for blood in stool and constipation. Negative for abdominal pain, nausea and vomiting.  Genitourinary: Negative for dysuria and urgency.  Musculoskeletal: Negative for myalgias and neck pain.  Skin: Negative for itching and rash.  Neurological: Negative for dizziness, tingling and headaches.  Endo/Heme/Allergies: Does not bruise/bleed easily.  Psychiatric/Behavioral: Negative for depression and suicidal ideas.   Blood pressure 178/75, pulse 71, temperature 98.4 F (36.9 C), temperature source Oral, resp. rate 23, SpO2 97 %. Physical Exam  Vitals reviewed. Constitutional: She is oriented to person, place, and time. She appears well-developed and well-nourished.  HENT:  Head: Normocephalic and atraumatic.  Eyes: Conjunctivae and EOM are normal. Pupils are equal, round, and reactive to light.  Neck: Normal range of motion. Neck supple.  Cardiovascular: Normal rate and regular rhythm.   Respiratory: Effort normal and breath sounds normal.  GI: Soft. Bowel sounds are normal. She exhibits no distension. There is no tenderness.  Genitourinary:  Genitourinary Comments: Low sphincter tone, large amount soft  stool within rectal vault. Pain to touch with some skin excoriation in the anal area.  Musculoskeletal: Normal range of motion.  Neurological: She is alert and oriented to person, place, and time.  Skin: Skin is warm and dry.  Psychiatric: She has a normal mood and affect. Her behavior is normal.    Assessment/Plan: 75 yo female with proctitis possibly related to impaction. -stool appears soft, recommend mag citrate/miralax/golytely as I do not  think she will tolerate bedside disimpaction -if oral catharsis will not work may require sedation -Due to low sphincter tone I do not think liquids put her at a risk of perforation of her rectum, but I would not use rigid enema devices (foley or red rubber devices are ok if patient tolerates them)  Arta Bruce Kinsinger 12/29/2016, 6:29 AM

## 2016-12-29 NOTE — ED Notes (Signed)
Attempted report x1. RN on [facility] to callback this RN. 

## 2016-12-29 NOTE — ED Notes (Signed)
ED Provider at bedside. 

## 2016-12-29 NOTE — ED Notes (Signed)
Patient transported to CT 

## 2016-12-29 NOTE — Progress Notes (Signed)
Pharmacy Antibiotic Note  Dana Blair is a 75 y.o. female admitted on 12/28/2016 with proctitis.  Pharmacy has been consulted for Ciprofloxacin dosing. Pt also on po Flagyl.  Cipro 400mg  IV given in ED ~0200  Plan: Ciprofloxacin 400mg  IV q12h Will f/u micro data, renal function, and pt's clinical condition     Temp (24hrs), Avg:98.4 F (36.9 C), Min:98.4 F (36.9 C), Max:98.4 F (36.9 C)   Recent Labs Lab 12/28/16 1820  WBC 10.9*  CREATININE 1.26*    CrCl cannot be calculated (Unknown ideal weight.).    No Known Allergies  Antimicrobials this admission: 12/30 Flagyl >>  12/30 Cipro >>   Microbiology results:  Thank you for allowing pharmacy to be a part of this patient's care.  Dana Blair, Dana Blair 12/29/2016 3:11 AM

## 2016-12-29 NOTE — ED Notes (Signed)
Patient had large brown BM post enema.

## 2016-12-29 NOTE — H&P (Signed)
History and Physical    Dana GlossBarbara J Vaccarella BJY:[id number] DOB: 04/23/1941 DOA: 12/28/2016   PCP: Murray HodgkinsArthur Green, MD   Patient coming from:  Home    Chief Complaint: Rectal Bleeding and  Pain   HPI: Dana Blair is a 75 y.o. female with extensive medical history listed below, brought by her son with progressive, one month history of rectal bleeding, the patient is unable to provide further history as she has a history of dementia. Per chart, the area  Was becoming very painful all the time, and difficulty with bowel movements due to severe pain. She denies any nausea vomiting or abdominal pain. She took castor oil with minimal relief . No history of GIB  Or hemorrhoids. She also complained of dysuria . She denied any fever, chills, chest pain r shortness of breath. No apparent syncope or falls. Takes ASA daily but no blood thinners, or NSAIDs. Does not use Pepto Bismol or Iron. She has not been drinking enough fluids which may have increased her constipation.   ED Course:  BP 158/65   Pulse 61   Temp 98.4 F (36.9 C) (Oral)   Resp 24   SpO2 100%   sodium 139 potassium 3.7 creatinine 1.26 hemoglobin 12.5 platelets 245 white count 10.9 glucose 164 large leukocytes in urine with negative nitrites.  surgery evaluated patient, deeming the pain to possible impaction. She was diagnosed with proctitis. Soft device enema is recommended for lower interest and evacuation, prior to further exploration of the area patient given Cipro and Flagyl for proctitis at the emergency department.  Review of Systems: As per HPI otherwise 10 point review of systems negative.   Past Medical History:  Diagnosis Date  . Abnormality of gait 05/05/2016  . Acidosis   . Acute respiratory failure (HCC)   . Atrial flutter (HCC) 05/05/2016  . Atrioventricular bloc first degree   . Chronic diastolic heart failure (HCC)   . Chronic kidney disease   . Debility 05/05/2016  . Dehydration   . Dementia 05/05/2016  . Diabetes  (HCC)    Diabetes mellitus with chronic kidney disease   . Dysphagia 05/05/2016  . Elevated white blood cell count   . Epilepsy with status epilepticus, not intractable (HCC)   . Glaucoma   . Hydronephrosis   . Hyperlipidemia   . Hypertension    [facility]- ECHO 06/29/09 - LVH, normal EF, renal ultrasound-no renal artery stenosis  . Orthostatic hypotension   . Osteopenia   . 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 She had concomitant respiratory failure. On May 22, 2008, CT of the head without contrast demonstrated a 9 x 20 mm acute hematoma within the head of the right caudate with intraventricular extension of hemorrhage. Neurosurgery consult-no ventriculostomy.  . Syncope 07/26/2015  . Unspecified severe protein-calorie malnutrition (HCC)     Past Surgical History:  Procedure Laterality Date  . ABDOMINAL HYSTERECTOMY    . FLEXIBLE SIGMOIDOSCOPY N/A 07/30/2015   Procedure: FLEXIBLE SIGMOIDOSCOPY;  Surgeon: Dorena CookeyJohn Hayes, MD;  [facility];  Service: Endoscopy;  Laterality: N/A;  2 fleets enemas at 7:30 AM on July 30    Social History Social History   Social History  . Marital status: Married    Spouse name: N/A  . Number of children: N/A  . Years of education: N/A   Occupational History  . retail, [employer]    Social History Main Topics  . Smoking status: Never Smoker  . Smokeless  tobacco: Never Used  . Alcohol use No  . Drug use: No  . Sexual activity: No   Other Topics Concern  . Not on file   Social History Narrative   Lives at [facility] since 05/01/16   Widow   Never smoked   No advance directives        No Known Allergies  Family History  Problem Relation Age of Onset  . Diabetes Mother   . Diabetes Father   . Diabetes Sister       Prior to Admission medications   Medication Sig Start Date End Date Taking? Authorizing Provider  amLODipine (NORVASC) 10 MG tablet TAKE ONE TABLET BY MOUTH ONCE DAILY TO CONTROL BLOOD PRESSURE  12/11/16  Yes Jake BatheMark C Skains, MD  aspirin EC 325 MG tablet Take 1 tablet (325 mg total) by mouth daily. 05/01/16  Yes Ripudeep Jenna LuoK Rai, MD  atorvastatin (LIPITOR) 20 MG tablet TAKE ONE TABLET BY MOUTH ONCE DAILY TO  LOWER  CHOLESTEROL 06/26/16  Yes Kimber RelicArthur G Green, MD  levETIRAcetam (KEPPRA) 500 MG tablet Take 2 tablets (1,000 mg total) by mouth 2 (two) times daily. 10/04/16  Yes Adam Mliss Fritz Jaffe, DO  memantine (NAMENDA XR) 28 MG CP24 24 hr capsule Take 1 capsule (28 mg total) by mouth daily. 12/11/16  Yes Adam Mliss Fritz Jaffe, DO  nebivolol (BYSTOLIC) 5 MG tablet Take 1 tablet (5 mg total) by mouth daily. 12/11/16  Yes Jake BatheMark C Skains, MD  SITagliptin Phosphate (JANUVIA PO) Take 1 tablet by mouth daily.   Yes Historical Provider, MD  famotidine (PEPCID) 40 MG/5ML suspension Take 2.5 mLs (20 mg total) by mouth 2 (two) times daily. Patient not taking: Reported on 12/29/2016 05/01/16   Ripudeep Jenna LuoK Rai, MD    Physical Exam:    Vitals:   12/29/16 0700 12/29/16 0715 12/29/16 0730 12/29/16 0800  BP: 169/73  167/89 158/65  Pulse: 61 68 64 61  Resp:  20 23 24   Temp:      TempSrc:      SpO2: 100% 100% 100% 100%       Constitutional: NAD, calm, comfortable, thin and frail  Vitals:   12/29/16 0700 12/29/16 0715 12/29/16 0730 12/29/16 0800  BP: 169/73  167/89 158/65  Pulse: 61 68 64 61  Resp:  20 23 24   Temp:      TempSrc:      SpO2: 100% 100% 100% 100%   Eyes: PERRL, lids and conjunctivae normal ENMT: Mucous membranes are moist. Posterior pharynx clear of any exudate or lesions.Normal dentition.  Neck: normal, supple, no masses, no thyromegaly Respiratory: clear to auscultation bilaterally, no wheezing, no crackles. Normal respiratory effort. No accessory muscle use.  Cardiovascular: Regular rate and rhythm, no murmurs / rubs / gallops. No extremity edema. 2+ pedal pulses. No carotid bruits.  Abdomen: no tenderness, no masses palpated. No hepatosplenomegaly. Bowel sounds positive. Per surgeon's report, she  had "low sphincter tone, with large amount of stool within the rectal fold, painful to touch, with some skin excoriation in the anal area." Musculoskeletal: no clubbing / cyanosis. No joint deformity upper and lower extremities. Good ROM, no contractures. Normal muscle tone.  Skin: no rashes, lesions, ulcers.  Neurologic: Follows simple commands, sensory and motor essentially equal  Psychiatric: Alert but disoriented due to dementia.     Labs on Admission: I have personally reviewed following labs and imaging studies  CBC:  Recent Labs Lab 12/28/16 1820 12/29/16 0344  WBC 10.9*  --   HGB  14.1 12.5  HCT 42.0 37.6  MCV 83.3  --   PLT 245  --     Basic Metabolic Panel:  Recent Labs Lab 12/28/16 1820  NA 139  K 3.7  CL 100*  CO2 22  GLUCOSE 164*  BUN 29*  CREATININE 1.26*  CALCIUM 10.0    GFR: CrCl cannot be calculated (Unknown ideal weight.).  Liver Function Tests:  Recent Labs Lab 12/28/16 1820  AST 29  ALT 15  ALKPHOS 82  BILITOT 1.3*  PROT 8.4*  ALBUMIN 4.2   No results for input(s): LIPASE, AMYLASE in the last 168 hours. No results for input(s): AMMONIA in the last 168 hours.  Coagulation Profile: No results for input(s): INR, PROTIME in the last 168 hours.  Cardiac Enzymes: No results for input(s): CKTOTAL, CKMB, CKMBINDEX, TROPONINI in the last 168 hours.  BNP (last 3 results) No results for input(s): PROBNP in the last 8760 hours.  HbA1C: No results for input(s): HGBA1C in the last 72 hours.  CBG: No results for input(s): GLUCAP in the last 168 hours.  Lipid Profile: No results for input(s): CHOL, HDL, LDLCALC, TRIG, CHOLHDL, LDLDIRECT in the last 72 hours.  Thyroid Function Tests: No results for input(s): TSH, T4TOTAL, FREET4, T3FREE, THYROIDAB in the last 72 hours.  Anemia Panel: No results for input(s): VITAMINB12, FOLATE, FERRITIN, TIBC, IRON, RETICCTPCT in the last 72 hours.  Urine analysis:    Component Value Date/Time    COLORURINE AMBER (A) 12/28/2016 2343   APPEARANCEUR CLOUDY (A) 12/28/2016 2343   LABSPEC 1.024 12/28/2016 2343   PHURINE 5.0 12/28/2016 2343   GLUCOSEU NEGATIVE 12/28/2016 2343   HGBUR LARGE (A) 12/28/2016 2343   BILIRUBINUR NEGATIVE 12/28/2016 2343   KETONESUR 20 (A) 12/28/2016 2343   PROTEINUR 100 (A) 12/28/2016 2343   UROBILINOGEN 0.2 07/29/2015 1127   NITRITE NEGATIVE 12/28/2016 2343   LEUKOCYTESUR LARGE (A) 12/28/2016 2343    Sepsis Labs: @LABRCNTIP (procalcitonin:4,lacticidven:4) )No results found for this or any previous visit (from the past 240 hour(s)).   Radiological Exams on Admission: Ct Abdomen Pelvis Wo Contrast  Result Date: 12/29/2016 CLINICAL DATA:  Subacute onset of constipation, with 10 pounds of weight loss. Rectal pressure and hematochezia. Initial encounter. EXAM: CT ABDOMEN AND PELVIS WITHOUT CONTRAST TECHNIQUE: Multidetector CT imaging of the abdomen and pelvis was performed following the standard protocol without IV contrast. COMPARISON:  CT of the abdomen and pelvis from 07/28/2015, and renal ultrasound performed 08/01/2015 FINDINGS: Lower chest: Scattered coronary artery calcification is seen. Minimal left basilar atelectasis or scarring is seen. Hepatobiliary: A 1.7 cm hypodensity within the right hepatic lobe likely reflects a cyst. Smaller nonspecific hypodensities are seen within the liver. The liver is otherwise unremarkable. The gallbladder is difficult fully assess, but appears grossly unremarkable. The common bile duct remains normal in caliber. Pancreas: The pancreas is unremarkable in appearance. Spleen: The spleen is diminutive and grossly unremarkable. Adrenals/Urinary Tract: The adrenal glands are unremarkable in appearance. The kidneys are within normal limits. There is no evidence of hydronephrosis. No renal or ureteral stones are seen. No perinephric stranding is appreciated. Stomach/Bowel: The stomach is unremarkable in appearance. The small bowel is  within normal limits. The appendix is not visualized; there is no evidence for appendicitis. Scattered diverticulosis is noted along the ascending colon, without evidence of diverticulitis. There is a large bolus of stool at the rectum, with diffuse soft tissue inflammation noted about the rectum, and mild associated wall thickening, compatible with proctitis. There is no definite  evidence of perforation. Vascular/Lymphatic: Diffuse calcification is seen along the abdominal aorta and its branches. The abdominal aorta is otherwise grossly unremarkable. The inferior vena cava is grossly unremarkable. No retroperitoneal lymphadenopathy is seen. No pelvic sidewall lymphadenopathy is identified. Reproductive: The bladder is mildly distended and grossly unremarkable. The patient is status post hysterectomy. A 3.2 cm cystic focus is noted at the right adnexa; this is relatively stable from 2016 and likely reflects the normal right ovary. The left ovary is slightly smaller and grossly unremarkable. Other: No additional soft tissue abnormalities are seen. Musculoskeletal: No acute osseous abnormalities are identified. Mild facet disease is noted along the lower lumbar spine. The visualized musculature is unremarkable in appearance. IMPRESSION: 1. Large bolus of stool at the rectum, with diffuse soft tissue inflammation about the rectum, and mild associated wall thickening, compatible with proctitis. No evidence of perforation at this time. 2. Diffuse aortic atherosclerosis. 3. Nonspecific hypodensities within the liver, the largest of which likely reflects a cyst. 4. Scattered coronary artery calcifications seen. 5. Scattered diverticulosis along the ascending colon, without evidence of diverticulitis. 6. 3.2 cm cystic focus at the right adnexa is relatively stable from 2016 and likely reflects the normal right ovary. Electronically Signed   By: Roanna Raider M.D.   On: 12/29/2016 00:56    EKG: Independently reviewed.    Assessment/Plan Active Problems:   Essential hypertension   Hyperlipidemia   History of stroke   DM2 (diabetes mellitus, type 2) (HCC)   AKI (acute kidney injury) (HCC)   Dehydration   Chronic diastolic congestive heart failure (HCC)   Status epilepticus (HCC)   GERD (gastroesophageal reflux disease)   Debility   Dysphagia   Abnormality of gait   Atrial flutter (HCC)   Dementia   Proctitis   Proctitis with rectal pain  Per EDP on digital rectal exam stool shows  Impaction  . Workup is in progress, and further recommendations pending once able to evacuate the lower GI further with strong enema. Hb normal  CT showed  Large bolus of stool at the rectum, with diffuse soft tissue inflammation about the rectum, and mild associated wall thickening, compatible with proctitis. No evidence of perforation at this time. Scattered diverticulosis   Admit to telemetry  NPO, advance to Clear liquids only  IV fluid Hold all NSAIDs including preadmission baby aspirin GI consult pending on further recommendations by GI  Cipro and Flagyl given at the ED   Hypertension BP 158/65   Pulse 61   anti-hypertensive medications on hold for now in case that surgery is needed. Patient is dehydrated, enema has been given which may affect her BP  Will monitor, if necessary will provide antihypertensive coverage.    UTI suggested by UA +  Leukocytes and neg nitrites  WBC is 10.9 . Received IV  And Cipro    F/u urine culture Follow CBC in am  IVF    Acute on Chronic kidney disease stage 3  likely due to dehydration   vs UTI.  BL 0.8 currently 1.26  Lab Results  Component Value Date   CREATININE 1.26 (H) 12/28/2016   CREATININE 0.87 04/27/2016   CREATININE 0.84 04/26/2016  IVF Hold diuretics  Repeat CMET in am Check blood cultures, urine culture   Hyperlipidemia Continue home statins  Type II Diabetes Current blood sugar level is  Lab Results  Component Value Date   HGBA1C 9.6 (H) 04/24/2016   Hgb A1C SSI Heart healthy carb modified diet.   Dementia  Continue Namenda  GERD, no acute symptoms: Continue PPI  History of epilepsy Continue Keppra    DVT prophylaxis:  SCD's Code Status:   Full Family Communication: none Disposition Plan: Expect patient to be discharged to home after condition improves Consults called:    None Admission status:Tele  Obs     Alivya Wegman E, PA-C [facility]   12/29/2016, 9:07 AM

## 2016-12-29 NOTE — ED Notes (Signed)
Notified pharmacy to verify and send meds.

## 2016-12-30 DIAGNOSIS — K5641 Fecal impaction: Secondary | ICD-10-CM | POA: Diagnosis not present

## 2016-12-30 DIAGNOSIS — N179 Acute kidney failure, unspecified: Secondary | ICD-10-CM

## 2016-12-30 DIAGNOSIS — K6289 Other specified diseases of anus and rectum: Secondary | ICD-10-CM | POA: Diagnosis not present

## 2016-12-30 LAB — CBC
HCT: 35.8 % — ABNORMAL LOW (ref 36.0–46.0)
Hemoglobin: 11.8 g/dL — ABNORMAL LOW (ref 12.0–15.0)
MCH: 27.3 pg (ref 26.0–34.0)
MCHC: 33 g/dL (ref 30.0–36.0)
MCV: 82.9 fL (ref 78.0–100.0)
PLATELETS: 211 10*3/uL (ref 150–400)
RBC: 4.32 MIL/uL (ref 3.87–5.11)
RDW: 13.7 % (ref 11.5–15.5)
WBC: 7.3 10*3/uL (ref 4.0–10.5)

## 2016-12-30 LAB — PROTIME-INR
INR: 1.01
Prothrombin Time: 13.3 seconds (ref 11.4–15.2)

## 2016-12-30 LAB — GLUCOSE, CAPILLARY
GLUCOSE-CAPILLARY: 105 mg/dL — AB (ref 65–99)
Glucose-Capillary: 79 mg/dL (ref 65–99)
Glucose-Capillary: 172 mg/dL — ABNORMAL HIGH (ref 65–99)

## 2016-12-30 LAB — URINE CULTURE

## 2016-12-30 LAB — COMPREHENSIVE METABOLIC PANEL
ALT: 11 U/L — AB (ref 14–54)
AST: 21 U/L (ref 15–41)
Albumin: 2.8 g/dL — ABNORMAL LOW (ref 3.5–5.0)
Alkaline Phosphatase: 62 U/L (ref 38–126)
Anion gap: 10 (ref 5–15)
BUN: 17 mg/dL (ref 6–20)
CHLORIDE: 104 mmol/L (ref 101–111)
CO2: 24 mmol/L (ref 22–32)
CREATININE: 0.99 mg/dL (ref 0.44–1.00)
Calcium: 8.6 mg/dL — ABNORMAL LOW (ref 8.9–10.3)
GFR calc non Af Amer: 54 mL/min — ABNORMAL LOW (ref >60)
Glucose, Bld: 95 mg/dL (ref 65–99)
Potassium: 3 mmol/L — ABNORMAL LOW (ref 3.5–5.1)
SODIUM: 138 mmol/L (ref 135–145)
Total Bilirubin: 1.1 mg/dL (ref 0.3–1.2)
Total Protein: 6.7 g/dL (ref 6.5–8.1)

## 2016-12-30 LAB — MAGNESIUM: Magnesium: 2.1 mg/dL (ref 1.7–2.4)

## 2016-12-30 LAB — HEMOGLOBIN A1C
Hgb A1c MFr Bld: 6.5 % — ABNORMAL HIGH (ref 4.8–5.6)
MEAN PLASMA GLUCOSE: 140 mg/dL

## 2016-12-30 MED ORDER — POTASSIUM CHLORIDE CRYS ER 20 MEQ PO TBCR
40.0000 meq | EXTENDED_RELEASE_TABLET | ORAL | Status: AC
  Administered 2016-12-30 (×2): 40 meq via ORAL
  Filled 2016-12-30 (×2): qty 2

## 2016-12-30 MED ORDER — POLYETHYLENE GLYCOL 3350 17 G PO PACK
17.0000 g | PACK | Freq: Two times a day (BID) | ORAL | Status: DC
  Administered 2016-12-30 – 2017-01-01 (×4): 17 g via ORAL
  Filled 2016-12-30 (×4): qty 1

## 2016-12-30 MED ORDER — SENNOSIDES-DOCUSATE SODIUM 8.6-50 MG PO TABS
2.0000 | ORAL_TABLET | Freq: Every day | ORAL | Status: DC
  Administered 2016-12-30 – 2016-12-31 (×2): 2 via ORAL
  Filled 2016-12-30 (×2): qty 2

## 2016-12-30 NOTE — Progress Notes (Signed)
PROGRESS NOTE  Dana Blair  WJX:[id number] DOB: 30-Nov-1941  DOA: 12/28/2016 PCP: Murray HodgkinsArthur Green, MD   Brief Narrative:  75 year old female with a PMH of HTN, HLD, seizures, dementia, DM 2, GERD, chronic kidney disease was [facility] ED on 12/29 by her son with 1 month history of progressive rectal pain, constipation and intermittent rectal bleeding. No nausea, vomiting or abdominal pain reported. She states that she used to have regular daily BM until 3-4 weeks ago and since then she has been constipated. Cannot say if there have been any change in her dietary habits. Based on imaging and clinical features, she was diagnosed with fecal impaction and related proctitis. Surgeons consulted. Had BMs after an enema in ED. Improving.   Assessment & Plan:   Principal Problem:   Proctitis Active Problems:   Essential hypertension   Hyperlipidemia   History of stroke   DM2 (diabetes mellitus, type 2) (HCC)   AKI (acute kidney injury) (HCC)   Dehydration   Chronic diastolic congestive heart failure (HCC)   GERD (gastroesophageal reflux disease)   Debility   Dysphagia   Abnormality of gait   Atrial flutter (HCC)   Dementia   Seizure (HCC)   1. Fecal impaction with proctitis: CT abdomen on 12/30 showed large bolus of stool at the rectum with diffuse soft tissue inflammation about the rectum compatible with proctitis. Fecal impaction was also confirmed on rectal exam in ED. General surgery was consulted and did not think that she would tolerate bedside disimpaction. Thereby she received an enema and oral bowel regimen. She has had multiple BMs since then. Rectal pain has improved. Continue bowel regimen and advance diet as tolerated. He is on Cipro and Flagyl. Patient states that she has not had a colonoscopy. May consider outpatient screening colonoscopy to rule out other etiologies for her proctitis other than fecal impaction, if patient and family desire aggressive  workup. 2. Essential hypertension: Mildly uncontrolled. Continue amlodipine. 3. Presumed UTI: Patient had dysuria. Urine microscopy positive. Urine culture not helpful, suggest contamination. Continue Cipro and treat as uncomplicated cystitis. 4. Anemia: Follow CBCs. 5. Acute on stage III chronic kidney disease: Resolved after IV fluids. 6. Type II DM: A1c 6.5. Reasonable inpatient control. Continue oral hypoglycemics at discharge. 7. Seizure disorder: Continue Keppra. 8. Hyperlipidemia: Statins. 9. Dementia: Mental status likely at baseline. 10. Hypokalemia: Replace and follow.   DVT prophylaxis: SCDs Code Status: Full Family Communication: None at bedside Disposition Plan: DC home possibly 12/31/16   Consultants:   General surgery  Procedures:   None  Antimicrobials:   Cipro and Flagyl    Subjective: Feels better. States that she has had 3 BMs since ED arrival. Rectal pain has improved. No abdominal pain, nausea or vomiting. Denies any other complaints.    Objective:  Vitals:   12/29/16 2216 12/30/16 0615 12/30/16 0900 12/30/16 1413  BP: (!) 145/82 (!) 161/67  (!) 167/68  Pulse: 63 (!) 59  (!) 56  Resp: 18 18  15   Temp: 98.2 F (36.8 C) 98.1 F (36.7 C)  98.2 F (36.8 C)  TempSrc: Oral Oral  Oral  SpO2: 100% 100%  98%  Weight:   49.5 kg (109 lb 3.2 oz)   Height:   5\' 4"  (1.626 m)     Intake/Output Summary (Last 24 hours) at 12/30/16 1553 Last data filed at 12/30/16 1500  Gross per 24 hour  Intake             2430  ml  Output                0 ml  Net             2430 ml   Filed Weights   12/30/16 0900  Weight: 49.5 kg (109 lb 3.2 oz)    Examination:  General exam: Pleasant elderly female lying comfortably supine in bed. Respiratory system: Clear to auscultation. Respiratory effort normal. Cardiovascular system: S1 & S2 heard, RRR. No JVD, murmurs, rubs, gallops or clicks. No pedal edema. Telemetry: SB in the 50s-SR. Gastrointestinal system: Abdomen  is nondistended, soft and nontender. No organomegaly or masses felt. Normal bowel sounds heard. Central nervous system: Alert and oriented 2. No focal neurological deficits. Extremities: Symmetric 5 x 5 power. Skin: No rashes, lesions or ulcers Psychiatry: Judgement and insight appear normal. Mood & affect appropriate.     Data Reviewed: I have personally reviewed following labs and imaging studies  CBC:  Recent Labs Lab 12/28/16 1820 12/29/16 0344 12/29/16 0926 12/29/16 1511 12/29/16 2029 12/30/16 0231  WBC 10.9*  --   --   --   --  7.3  HGB 14.1 12.5 12.8 14.2 12.0 11.8*  HCT 42.0 37.6 39.2 42.0 35.4* 35.8*  MCV 83.3  --   --   --   --  82.9  PLT 245  --   --   --   --  211   Basic Metabolic Panel:  Recent Labs Lab 12/28/16 1820 12/30/16 0231 12/30/16 0239  NA 139 138  --   K 3.7 3.0*  --   CL 100* 104  --   CO2 22 24  --   GLUCOSE 164* 95  --   BUN 29* 17  --   CREATININE 1.26* 0.99  --   CALCIUM 10.0 8.6*  --   MG  --   --  2.1   GFR: Estimated Creatinine Clearance: 38.4 mL/min (by C-G formula based on SCr of 0.99 mg/dL). Liver Function Tests:  Recent Labs Lab 12/28/16 1820 12/30/16 0231  AST 29 21  ALT 15 11*  ALKPHOS 82 62  BILITOT 1.3* 1.1  PROT 8.4* 6.7  ALBUMIN 4.2 2.8*   No results for input(s): LIPASE, AMYLASE in the last 168 hours. No results for input(s): AMMONIA in the last 168 hours. Coagulation Profile:  Recent Labs Lab 12/30/16 0231  INR 1.01   Cardiac Enzymes: No results for input(s): CKTOTAL, CKMB, CKMBINDEX, TROPONINI in the last 168 hours. BNP (last 3 results) No results for input(s): PROBNP in the last 8760 hours. HbA1C:  Recent Labs  12/29/16 0929  HGBA1C 6.5*   CBG:  Recent Labs Lab 12/29/16 1158 12/29/16 1705 12/29/16 2215 12/30/16 0757  GLUCAP 109* 125* 101* 105*   Lipid Profile: No results for input(s): CHOL, HDL, LDLCALC, TRIG, CHOLHDL, LDLDIRECT in the last 72 hours. Thyroid Function Tests: No  results for input(s): TSH, T4TOTAL, FREET4, T3FREE, THYROIDAB in the last 72 hours. Anemia Panel: No results for input(s): VITAMINB12, FOLATE, FERRITIN, TIBC, IRON, RETICCTPCT in the last 72 hours.  Sepsis Labs:  Recent Labs Lab 12/29/16 [id number] 12/29/16 1509  LATICACIDVEN 0.9 1.3    Recent Results (from the past 240 hour(s))  Urine culture     Status: Abnormal   Collection Time: 12/28/16 11:55 PM  Result Value Ref Range Status   Specimen Description URINE, RANDOM  Final   Special Requests NONE  Final   Culture MULTIPLE SPECIES PRESENT, SUGGEST RECOLLECTION (A)  Final  Report Status 12/30/2016 FINAL  Final         Radiology Studies: Ct Abdomen Pelvis Wo Contrast  Result Date: 12/29/2016 CLINICAL DATA:  Subacute onset of constipation, with 10 pounds of weight loss. Rectal pressure and hematochezia. Initial encounter. EXAM: CT ABDOMEN AND PELVIS WITHOUT CONTRAST TECHNIQUE: Multidetector CT imaging of the abdomen and pelvis was performed following the standard protocol without IV contrast. COMPARISON:  CT of the abdomen and pelvis from 07/28/2015, and renal ultrasound performed 08/01/2015 FINDINGS: Lower chest: Scattered coronary artery calcification is seen. Minimal left basilar atelectasis or scarring is seen. Hepatobiliary: A 1.7 cm hypodensity within the right hepatic lobe likely reflects a cyst. Smaller nonspecific hypodensities are seen within the liver. The liver is otherwise unremarkable. The gallbladder is difficult fully assess, but appears grossly unremarkable. The common bile duct remains normal in caliber. Pancreas: The pancreas is unremarkable in appearance. Spleen: The spleen is diminutive and grossly unremarkable. Adrenals/Urinary Tract: The adrenal glands are unremarkable in appearance. The kidneys are within normal limits. There is no evidence of hydronephrosis. No renal or ureteral stones are seen. No perinephric stranding is appreciated. Stomach/Bowel: The stomach is  unremarkable in appearance. The small bowel is within normal limits. The appendix is not visualized; there is no evidence for appendicitis. Scattered diverticulosis is noted along the ascending colon, without evidence of diverticulitis. There is a large bolus of stool at the rectum, with diffuse soft tissue inflammation noted about the rectum, and mild associated wall thickening, compatible with proctitis. There is no definite evidence of perforation. Vascular/Lymphatic: Diffuse calcification is seen along the abdominal aorta and its branches. The abdominal aorta is otherwise grossly unremarkable. The inferior vena cava is grossly unremarkable. No retroperitoneal lymphadenopathy is seen. No pelvic sidewall lymphadenopathy is identified. Reproductive: The bladder is mildly distended and grossly unremarkable. The patient is status post hysterectomy. A 3.2 cm cystic focus is noted at the right adnexa; this is relatively stable from 2016 and likely reflects the normal right ovary. The left ovary is slightly smaller and grossly unremarkable. Other: No additional soft tissue abnormalities are seen. Musculoskeletal: No acute osseous abnormalities are identified. Mild facet disease is noted along the lower lumbar spine. The visualized musculature is unremarkable in appearance. IMPRESSION: 1. Large bolus of stool at the rectum, with diffuse soft tissue inflammation about the rectum, and mild associated wall thickening, compatible with proctitis. No evidence of perforation at this time. 2. Diffuse aortic atherosclerosis. 3. Nonspecific hypodensities within the liver, the largest of which likely reflects a cyst. 4. Scattered coronary artery calcifications seen. 5. Scattered diverticulosis along the ascending colon, without evidence of diverticulitis. 6. 3.2 cm cystic focus at the right adnexa is relatively stable from 2016 and likely reflects the normal right ovary. Electronically Signed   By: Roanna Raider M.D.   On:  12/29/2016 00:56   Dg Abd Portable 1v  Result Date: 12/29/2016 CLINICAL DATA:  Constipation. EXAM: PORTABLE ABDOMEN - 1 VIEW COMPARISON:  CT scan of same day. FINDINGS: Large amount of residual contrast is seen in the colon. No abnormal bowel dilatation is noted. Atherosclerosis of abdominal aorta is noted. No other abnormal calcifications are noted. IMPRESSION: Aortic atherosclerosis.  No evidence of bowel obstruction or ileus. Electronically Signed   By: Lupita Raider, M.D.   On: 12/29/2016 19:49        Scheduled Meds: . amLODipine  10 mg Oral Daily  . aspirin EC  325 mg Oral Daily  . atorvastatin  20 mg Oral  q1800  . ciprofloxacin  400 mg Intravenous Q12H  . insulin aspart  0-9 Units Subcutaneous TID WC  . levETIRAcetam  1,000 mg Oral BID  . memantine  28 mg Oral Daily  . metroNIDAZOLE  500 mg Oral Q8H  . nebivolol  5 mg Oral Daily   Continuous Infusions: . sodium chloride 100 mL/hr at 12/30/16 0323     LOS: 0 days    [facility], MD [facility] Pager [phone]  If 7PM-7AM, please contact night-coverage [email or web address] Password [facility] 12/30/2016, 3:53 PM

## 2016-12-30 NOTE — Progress Notes (Signed)
Patient had a large,brown BM post enema yesterday.  Proctitis noted on CT, possibly from fecal impaction vs other pathology.  Flex sig may be helpful to further define the process once impaction is relieved.  No acute general surgery issues currently. We are available if needed.

## 2016-12-31 DIAGNOSIS — K6289 Other specified diseases of anus and rectum: Secondary | ICD-10-CM | POA: Diagnosis not present

## 2016-12-31 DIAGNOSIS — N179 Acute kidney failure, unspecified: Secondary | ICD-10-CM | POA: Diagnosis not present

## 2016-12-31 DIAGNOSIS — K5641 Fecal impaction: Secondary | ICD-10-CM | POA: Diagnosis not present

## 2016-12-31 LAB — BASIC METABOLIC PANEL
Anion gap: 9 (ref 5–15)
BUN: 14 mg/dL (ref 6–20)
CALCIUM: 8.6 mg/dL — AB (ref 8.9–10.3)
CO2: 22 mmol/L (ref 22–32)
Chloride: 108 mmol/L (ref 101–111)
Creatinine, Ser: 1.01 mg/dL — ABNORMAL HIGH (ref 0.44–1.00)
GFR calc Af Amer: >60 mL/min (ref >60)
GFR, EST NON AFRICAN AMERICAN: 53 mL/min — AB (ref >60)
GLUCOSE: 124 mg/dL — AB (ref 65–99)
Potassium: 3.9 mmol/L (ref 3.5–5.1)
Sodium: 139 mmol/L (ref 135–145)

## 2016-12-31 LAB — GLUCOSE, CAPILLARY
GLUCOSE-CAPILLARY: 77 mg/dL (ref 65–99)
Glucose-Capillary: 150 mg/dL — ABNORMAL HIGH (ref 65–99)
Glucose-Capillary: 132 mg/dL — ABNORMAL HIGH (ref 65–99)
Glucose-Capillary: 204 mg/dL — ABNORMAL HIGH (ref 65–99)

## 2016-12-31 LAB — CBC
HEMATOCRIT: 37.2 % (ref 36.0–46.0)
HEMOGLOBIN: 12 g/dL (ref 12.0–15.0)
MCH: 27.6 pg (ref 26.0–34.0)
MCHC: 32.3 g/dL (ref 30.0–36.0)
MCV: 85.5 fL (ref 78.0–100.0)
Platelets: 206 10*3/uL (ref 150–400)
RBC: 4.35 MIL/uL (ref 3.87–5.11)
RDW: 14.2 % (ref 11.5–15.5)
WBC: 5.3 10*3/uL (ref 4.0–10.5)

## 2016-12-31 MED ORDER — POLYETHYLENE GLYCOL 3350 17 G PO PACK: 17.0000 g | PACK | Freq: Two times a day (BID) | ORAL | 0 refills | Status: DC

## 2016-12-31 MED ORDER — CIPROFLOXACIN HCL 500 MG PO TABS: 500.0000 mg | ORAL_TABLET | Freq: Two times a day (BID) | ORAL | 0 refills | Status: DC

## 2016-12-31 MED ORDER — BISACODYL 10 MG RE SUPP: 10.0000 mg | Freq: Every day | RECTAL | 0 refills | Status: DC | PRN

## 2016-12-31 MED ORDER — CIPROFLOXACIN HCL 500 MG PO TABS
500.0000 mg | ORAL_TABLET | Freq: Two times a day (BID) | ORAL | Status: DC
  Administered 2016-12-31 – 2017-01-01 (×3): 500 mg via ORAL
  Filled 2016-12-31 (×3): qty 1

## 2016-12-31 MED ORDER — METRONIDAZOLE 500 MG PO TABS: 500.0000 mg | ORAL_TABLET | Freq: Three times a day (TID) | ORAL | 0 refills | Status: DC

## 2016-12-31 MED ORDER — ACETAMINOPHEN 325 MG PO TABS: 650.0000 mg | ORAL_TABLET | Freq: Four times a day (QID) | ORAL | Status: DC | PRN

## 2016-12-31 MED ORDER — SENNOSIDES-DOCUSATE SODIUM 8.6-50 MG PO TABS: 2.0000 | ORAL_TABLET | Freq: Every day | ORAL | 0 refills | Status: DC

## 2016-12-31 NOTE — Discharge Summary (Signed)
Physician Discharge Summary  Dana Blair ZOX:[id number] DOB: 16-Aug-1941  PCP: Murray Hodgkins, MD  Admit date: 12/28/2016 Discharge date: 12/31/2016  Recommendations for Outpatient Follow-up:  1. Dr. Murray Hodgkins, PCP in one week with repeat labs (CBC & BMP). 2. Dr. Dorena Cookey, [facility] in 2 weeks: Follow-up regarding need for further evaluation i.e. colonoscopy.  Home Health: PT & Aide Equipment/Devices: Rolling walker with 5 inch wheels.    Discharge Condition: Improved and stable. CODE STATUS: Full  Diet recommendation: Heart healthy diet.  Discharge Diagnoses:  Principal Problem:   Proctitis Active Problems:   Essential hypertension   Hyperlipidemia   History of stroke   DM2 (diabetes mellitus, type 2) (HCC)   AKI (acute kidney injury) (HCC)   Dehydration   Chronic diastolic congestive heart failure (HCC)   GERD (gastroesophageal reflux disease)   Debility   Dysphagia   Abnormality of gait   Atrial flutter (HCC)   Dementia   Seizure (HCC)   Brief/Interim Summary: 76 year old female with a PMH of HTN, HLD, seizures, dementia, DM 2, GERD, chronic kidney disease was brought to the [facility] ED on 12/29 by her son with 1 month history of progressive rectal pain, constipation and intermittent rectal bleeding. No nausea, vomiting or abdominal pain reported. She states that she used to have regular daily BM until 3-4 weeks ago and since then she has been constipated. Cannot say if there have been any change in her dietary habits. Based on imaging and clinical features, she was diagnosed with fecal impaction and related proctitis. Surgeons consulted. Had BMs after an enema in ED. Improved   Assessment & Plan:   1. Fecal impaction with proctitis: CT abdomen on 12/30 showed large bolus of stool at the rectum with diffuse soft tissue inflammation about the rectum compatible with proctitis. Fecal impaction was also confirmed on rectal exam in ED. General surgery was consulted and did  not think that she would tolerate bedside disimpaction. Thereby she received an enema and oral bowel regimen. She has had multiple BMs since then. Rectal pain has improved. Continue bowel regimen and she is tolerating regular diet. She was started on Cipro and Flagyl. Patient states that she has not had a colonoscopy. May consider outpatient screening colonoscopy to rule out other etiologies for her proctitis other than fecal impaction, if patient and family desire aggressive workup. As discussed with surgery, we will complete total of 5 days of antibiotics. Of note, she did not have high fever or significant leukocytosis on admission. On reviewing her records, she had flexible sigmoidoscopy on 07/30/15 by Dr. Dorena Cookey which showed: Vague ulcerated/inflamed focal abnormality of the distal rectum, cannot rule out neoplasm but not obviously neoplastic visibly. Could be a stercoral ulcer. Pathology did not reveal malignancy and showed fibropurulent material and ischemic changes. 2. Essential hypertension: Mildly uncontrolled. Continue amlodipine, nebivolol.. 3. Presumed UTI: Patient had dysuria. Urine microscopy positive. Urine culture not helpful, suggest contamination. Continue Cipro and treat as uncomplicated cystitis. 4. Anemia: Stable. 5. 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 6. Type II DM: A1c 6.5. Reasonable inpatient control. Continue oral hypoglycemics at discharge. 7. Seizure disorder: Continue Keppra. 8. Hyperlipidemia: Statins. 9. Dementia: Mental status likely at baseline. 10. Hypokalemia: Replaced   Consultants:   General surgery  Procedures:   None   Discharge Instructions  Discharge Instructions    Call MD for:    Complete by:  As directed    Worsening rectal pain or bleeding.  Call MD for:  extreme fatigue    Complete by:  As directed    Call MD for:  persistant dizziness or light-headedness    Complete by:  As directed    Call MD for:   persistant nausea and vomiting    Complete by:  As directed    Call MD for:  severe uncontrolled pain    Complete by:  As directed    Call MD for:  temperature >100.4    Complete by:  As directed    Diet - low sodium heart healthy    Complete by:  As directed    Diet Carb Modified    Complete by:  As directed    Increase activity slowly    Complete by:  As directed        Medication List    STOP taking these medications   famotidine 40 MG/5ML suspension Commonly known as:  PEPCID     TAKE these medications   acetaminophen 325 MG tablet Commonly known as:  TYLENOL Take 2 tablets (650 mg total) by mouth every 6 (six) hours as needed for mild pain, moderate pain, fever or headache (or Fever >/= 101).   amLODipine 10 MG tablet Commonly known as:  NORVASC TAKE ONE TABLET BY MOUTH ONCE DAILY TO CONTROL BLOOD PRESSURE   aspirin EC 325 MG tablet Take 1 tablet (325 mg total) by mouth daily.   atorvastatin 20 MG tablet Commonly known as:  LIPITOR TAKE ONE TABLET BY MOUTH ONCE DAILY TO  LOWER  CHOLESTEROL   bisacodyl 10 MG suppository Commonly known as:  DULCOLAX Place 1 suppository (10 mg total) rectally daily as needed for moderate constipation.   ciprofloxacin 500 MG tablet Commonly known as:  CIPRO Take 1 tablet (500 mg total) by mouth 2 (two) times daily.   JANUVIA PO Take 1 tablet by mouth daily. What changed:  Another medication with the same name was removed. Continue taking this medication, and follow the directions you see here.   levETIRAcetam 500 MG tablet Commonly known as:  KEPPRA Take 2 tablets (1,000 mg total) by mouth 2 (two) times daily.   memantine 28 MG Cp24 24 hr capsule Commonly known as:  NAMENDA XR Take 1 capsule (28 mg total) by mouth daily.   metroNIDAZOLE 500 MG tablet Commonly known as:  FLAGYL Take 1 tablet (500 mg total) by mouth 3 (three) times daily.   nebivolol 5 MG tablet Commonly known as:  BYSTOLIC Take 1 tablet (5 mg total) by  mouth daily.   polyethylene glycol packet Commonly known as:  MIRALAX / GLYCOLAX Take 17 g by mouth 2 (two) times daily.   senna-docusate 8.6-50 MG tablet Commonly known as:  Senokot-S Take 2 tablets by mouth at bedtime.      Follow-up Information    Murray HodgkinsArthur Green, MD. Schedule an appointment as soon as possible for a visit in 1 week(s).   Specialty:  Internal Medicine Why:  To be seen with repeat labs (CBC & BMP). Contact information: [address] [place] [address] [phone]        HAYES,JOHN C, MD. Schedule an appointment as soon as possible for a visit in 2 week(s).   Specialty:  Gastroenterology Why:  Hospitalized for fecal impaction and proctitis. Follow-up regarding need for screening colonoscopy. Contact information: [address] [place] [address] [phone]          No Known Allergies  Procedures/Studies: Ct Abdomen Pelvis Wo Contrast  Result Date: 12/29/2016 CLINICAL DATA:  Subacute onset of constipation, with 10 pounds of weight loss. Rectal pressure and hematochezia. Initial encounter. EXAM: CT ABDOMEN AND PELVIS WITHOUT CONTRAST TECHNIQUE: Multidetector CT imaging of the abdomen and pelvis was performed following the standard protocol without IV contrast. COMPARISON:  CT of the abdomen and pelvis from 07/28/2015, and renal ultrasound performed 08/01/2015 FINDINGS: Lower chest: Scattered coronary artery calcification is seen. Minimal left basilar atelectasis or scarring is seen. Hepatobiliary: A 1.7 cm hypodensity within the right hepatic lobe likely reflects a cyst. Smaller nonspecific hypodensities are seen within the liver. The liver is otherwise unremarkable. The gallbladder is difficult fully assess, but appears grossly unremarkable. The common bile duct remains normal in caliber. Pancreas: The pancreas is unremarkable in appearance. Spleen: The spleen is diminutive and grossly unremarkable. Adrenals/Urinary Tract: The adrenal  glands are unremarkable in appearance. The kidneys are within normal limits. There is no evidence of hydronephrosis. No renal or ureteral stones are seen. No perinephric stranding is appreciated. Stomach/Bowel: The stomach is unremarkable in appearance. The small bowel is within normal limits. The appendix is not visualized; there is no evidence for appendicitis. Scattered diverticulosis is noted along the ascending colon, without evidence of diverticulitis. There is a large bolus of stool at the rectum, with diffuse soft tissue inflammation noted about the rectum, and mild associated wall thickening, compatible with proctitis. There is no definite evidence of perforation. Vascular/Lymphatic: Diffuse calcification is seen along the abdominal aorta and its branches. The abdominal aorta is otherwise grossly unremarkable. The inferior vena cava is grossly unremarkable. No retroperitoneal lymphadenopathy is seen. No pelvic sidewall lymphadenopathy is identified. Reproductive: The bladder is mildly distended and grossly unremarkable. The patient is status post hysterectomy. A 3.2 cm cystic focus is noted at the right adnexa; this is relatively stable from 2016 and likely reflects the normal right ovary. The left ovary is slightly smaller and grossly unremarkable. Other: No additional soft tissue abnormalities are seen. Musculoskeletal: No acute osseous abnormalities are identified. Mild facet disease is noted along the lower lumbar spine. The visualized musculature is unremarkable in appearance. IMPRESSION: 1. Large bolus of stool at the rectum, with diffuse soft tissue inflammation about the rectum, and mild associated wall thickening, compatible with proctitis. No evidence of perforation at this time. 2. Diffuse aortic atherosclerosis. 3. Nonspecific hypodensities within the liver, the largest of which likely reflects a cyst. 4. Scattered coronary artery calcifications seen. 5. Scattered diverticulosis along the  ascending colon, without evidence of diverticulitis. 6. 3.2 cm cystic focus at the right adnexa is relatively stable from 2016 and likely reflects the normal right ovary. Electronically Signed   By: Roanna Raider M.D.   On: 12/29/2016 00:56   Dg Abd Portable 1v  Result Date: 12/29/2016 CLINICAL DATA:  Constipation. EXAM: PORTABLE ABDOMEN - 1 VIEW COMPARISON:  CT scan of same day. FINDINGS: Large amount of residual contrast is seen in the colon. No abnormal bowel dilatation is noted. Atherosclerosis of abdominal aorta is noted. No other abnormal calcifications are noted. IMPRESSION: Aortic atherosclerosis.  No evidence of bowel obstruction or ileus. Electronically Signed   By: Lupita Raider, M.D.   On: 12/29/2016 19:49      Subjective: Continues to feel better. Mild rectal pain at times during defecation. Having BMs. Tolerating regular consistency diet. No abdominal pain, nausea, vomiting, dysuria, urinary frequency or fevers. As per RN, no acute issues.  Discharge Exam:  Vitals:   12/30/16 2148 12/31/16 1610 12/31/16 9604 12/31/16 5409  BP: 139/63  (!) 154/60 (!) 148/64  Pulse: 63  (!) 53   Resp: 16  16   Temp: 98.2 F (36.8 C)  97.8 F (36.6 C)   TempSrc: Oral  Oral   SpO2: 99%  100%   Weight:  52.2 kg (115 lb)    Height:        General exam: Pleasant elderly female lying comfortably supine in bed. Respiratory system: Clear to auscultation. Respiratory effort normal. Cardiovascular system: S1 & S2 heard, RRR. No JVD, murmurs, rubs, gallops or clicks. No pedal edema.  Gastrointestinal system: Abdomen is nondistended, soft and nontender. No organomegaly or masses felt. Normal bowel sounds heard. Central nervous system: Alert and oriented 2. No focal neurological deficits. Extremities: Symmetric 5 x 5 power. Skin: No rashes, lesions or ulcers Psychiatry: Judgement and insight appear impaired. Mood & affect appropriate.      The results of significant diagnostics from this  hospitalization (including imaging, microbiology, ancillary and laboratory) are listed below for reference.     Microbiology: Recent Results (from the past 240 hour(s))  Urine culture     Status: Abnormal   Collection Time: 12/28/16 11:55 PM  Result Value Ref Range Status   Specimen Description URINE, RANDOM  Final   Special Requests NONE  Final   Culture MULTIPLE SPECIES PRESENT, SUGGEST RECOLLECTION (A)  Final   Report Status 12/30/2016 FINAL  Final     Labs: BNP (last 3 results) No results for input(s): BNP in the last 8760 hours. Basic Metabolic Panel:  Recent Labs Lab 12/28/16 1820 12/30/16 0231 12/30/16 0239 12/31/16 0425  NA 139 138  --  139  K 3.7 3.0*  --  3.9  CL 100* 104  --  108  CO2 22 24  --  22  GLUCOSE 164* 95  --  124*  BUN 29* 17  --  14  CREATININE 1.26* 0.99  --  1.01*  CALCIUM 10.0 8.6*  --  8.6*  MG  --   --  2.1  --    Liver Function Tests:  Recent Labs Lab 12/28/16 1820 12/30/16 0231  AST 29 21  ALT 15 11*  ALKPHOS 82 62  BILITOT 1.3* 1.1  PROT 8.4* 6.7  ALBUMIN 4.2 2.8*   CBC:  Recent Labs Lab 12/28/16 1820  12/29/16 0926 12/29/16 1511 12/29/16 2029 12/30/16 0231 12/31/16 0425  WBC 10.9*  --   --   --   --  7.3 5.3  HGB 14.1  < > 12.8 14.2 12.0 11.8* 12.0  HCT 42.0  < > 39.2 42.0 35.4* 35.8* 37.2  MCV 83.3  --   --   --   --  82.9 85.5  PLT 245  --   --   --   --  211 206  < > = values in this interval not displayed. CBG:  Recent Labs Lab 12/30/16 1144 12/30/16 1645 12/30/16 2147 12/31/16 0751 12/31/16 1152  GLUCAP 77 79 172* 150* 132*   Hgb A1c  Recent Labs  12/29/16 0929  HGBA1C 6.5*   Urinalysis    Component Value Date/Time   COLORURINE AMBER (A) 12/28/2016 2343   APPEARANCEUR CLOUDY (A) 12/28/2016 2343   LABSPEC 1.024 12/28/2016 2343   PHURINE 5.0 12/28/2016 2343   GLUCOSEU NEGATIVE 12/28/2016 2343   HGBUR LARGE (A) 12/28/2016 2343   BILIRUBINUR NEGATIVE 12/28/2016 2343   KETONESUR 20 (A)  12/28/2016 2343   PROTEINUR 100 (A) 12/28/2016 2343   UROBILINOGEN 0.2 07/29/2015  1127   NITRITE NEGATIVE 12/28/2016 2343   LEUKOCYTESUR LARGE (A) 12/28/2016 2343     Time coordinating discharge: Over 30 minutes  SIGNED:  Marcellus Scott, MD, FACP, FHM. [facility] Pager [phone]  If 7PM-7AM, please contact night-coverage [email or web address] Password [email or web address] 12/31/2016, 3:53 PM

## 2016-12-31 NOTE — Evaluation (Signed)
Physical Therapy Evaluation Patient Details Name: Dana Blair MRN: [id number] DOB: Jun 24, 1941 Today's Date: 12/31/2016   History of Present Illness  76 year old female with a PMH of HTN, HLD, seizures, dementia, DM 2, GERD, chronic kidney disease [facility] by her son with 1 month history of progressive rectal pain, constipation and intermittent rectal bleeding.  Clinical Impression  Pt admitted with above diagnosis. Pt currently with functional limitations due to the deficits listed below (see PT Problem List). Pt ambulated 200' with min-guard A, frequently reaching for wall rail, would benefit from use of RW, especially for when she is fatigued. Pt would also benefit from HHPT and HHaide to help with bathing a few times a week.  Pt will benefit from skilled PT to increase their independence and safety with mobility to allow discharge to the venue listed below.       Follow Up Recommendations Home health PT;Other (comment) [facility](HHaide)    Equipment Recommendations  Rolling walker with 5" wheels    Recommendations for Other Services       Precautions / Restrictions Precautions Precautions: Fall Restrictions Weight Bearing Restrictions: No      Mobility  Bed Mobility Overal bed mobility: Modified Independent                Transfers Overall transfer level: Needs assistance Equipment used: None Transfers: Sit to/from Stand Sit to Stand: Supervision         General transfer comment: pt stood without support and no LOB, however, quickly reaches for stable surface  Ambulation/Gait Ambulation/Gait assistance: Min guard Ambulation Distance (Feet): 200 Feet Assistive device:  (pushed IV pole) Gait Pattern/deviations: Step-through pattern;Drifts right/left Gait velocity: decreased Gait velocity interpretation:  (between 1.8 and 2.62 ft/sec) General Gait Details: pt reaches out for wall when she is not pushing IV pole. Reports that she has never  used a RW but would be good to try next session. Slightly more unsteady end of walk with fatigue   Stairs            Wheelchair Mobility    Modified Rankin (Stroke Patients Only)       Balance Overall balance assessment: Needs assistance Sitting-balance support: No upper extremity supported Sitting balance-Leahy Scale: Good     Standing balance support: Single extremity supported Standing balance-Leahy Scale: Fair Standing balance comment: stood for bowel clean up with one hand on bed rail and was able to step into briefs one foot at a time.                              Pertinent Vitals/Pain Pain Assessment: No/denies pain    Home Living Family/patient expects to be discharged to:: Private residence Living Arrangements: Children Available Help at Discharge: Family;Available PRN/intermittently Type of Home: House Home Access: Stairs to enter Entrance Stairs-Rails: None Entrance Stairs-Number of Steps: 1/3 Home Layout: One level Home Equipment: None Additional Comments: pt reports that her son lives with her. He works in the day. A neighbor checks on her when son is working.     Prior Function Level of Independence: Independent         Comments: recently stopped driving     Hand Dominance        Extremity/Trunk Assessment   Upper Extremity Assessment Upper Extremity Assessment: Overall WFL for tasks assessed    Lower Extremity Assessment Lower Extremity Assessment: Overall WFL for tasks assessed  Cervical / Trunk Assessment Cervical / Trunk Assessment: Normal  Communication   Communication: Expressive difficulties (decreased verbalization with slight delay)  Cognition Arousal/Alertness: Awake/alert Behavior During Therapy: WFL for tasks assessed/performed Overall Cognitive Status: History of cognitive impairments - at baseline                 General Comments: mildy delayed response time, expect that this is her baseline but  no family present to confirm    General Comments General comments (skin integrity, edema, etc.): Pt with soiled brief that she reports is several days old, cleaned up as well as possible and then spoke with NT about her getting a shower before d/c    Exercises     Assessment/Plan    PT Assessment Patient needs continued PT services  PT Problem List Decreased balance;Decreased mobility;Decreased activity tolerance;Decreased knowledge of use of DME;Decreased knowledge of precautions          PT Treatment Interventions DME instruction;Gait training;Stair training;Functional mobility training;Therapeutic activities;Therapeutic exercise;Balance training;Patient/family education    PT Goals (Current goals can be found in the Care Plan section)  Acute Rehab PT Goals Patient Stated Goal: return home PT Goal Formulation: With patient Time For Goal Achievement: 01/14/17 Potential to Achieve Goals: Good    Frequency Min 3X/week   Barriers to discharge Decreased caregiver support      Co-evaluation               End of Session Equipment Utilized During Treatment: Gait belt Activity Tolerance: Patient tolerated treatment well Patient left: in chair;with call bell/phone within reach;with chair alarm set Nurse Communication: Mobility status    Functional Assessment Tool Used: clinical judgement Functional Limitation: Mobility: Walking and moving around Mobility: Walking and Moving Around Current Status [phone]): At least 1 percent but less than 20 percent impaired, limited or restricted Mobility: Walking and Moving Around Goal Status [phone]): 0 percent impaired, limited or restricted    Time: 1152-1218 PT Time Calculation (min) (ACUTE ONLY): 26 min   Charges:   PT Evaluation $PT Eval Moderate Complexity: 1 Procedure PT Treatments $Gait Training: 8-22 mins   PT G Codes:   PT G-Codes **NOT FOR INPATIENT CLASS** Functional Assessment Tool Used: clinical  judgement Functional Limitation: Mobility: Walking and moving around Mobility: Walking and Moving Around Current Status (A2130): At least 1 percent but less than 20 percent impaired, limited or restricted Mobility: Walking and Moving Around Goal Status [phone]): 0 percent impaired, limited or restricted   Lyanne Co, PT  Acute Rehab Services  [phone]  Dana Blair 12/31/2016, 1:31 PM

## 2016-12-31 NOTE — Discharge Instructions (Addendum)
Proctitis Introduction Proctitis is swelling and soreness (inflammation) of the lining of the rectum. The rectum is at the end of the large intestine, and it leads to the anus. The inflammation causes pain and discomfort. It may be a short-term (acute) or long-lasting (chronic) problem. What are the causes? This condition may be caused by:  STDs (sexually transmitted diseases).  Infection.  Trauma or injury to the anus or rectum.  Ulcerative colitis or Crohn disease.  Radiation therapy that is directed near the rectum.  Antibiotic therapy. What are the signs or symptoms? Symptoms of this condition include:  Sudden, uncomfortable, and frequent urge to have a bowel movement.  Anal pain or rectal pain.  Pain or cramping in the abdomen.  Sensation that the rectum is full.  Rectal bleeding.  Pus or mucus discharge from the anus.  Diarrhea or frequent soft, loose stools.  Constipation.  Pain with bowel movements. How is this diagnosed? This condition may be diagnosed based on:  A medical history and physical exam.  Various tests, such as:  An STD test.  Blood tests.  Stool tests.  Rectal culture.  A procedure to evaluate the anal canal (anoscopy).  Procedures to look at the entire large bowel or part of it (colonoscopy or sigmoidoscopy). How is this treated? Treatment for this condition depends on the cause. The main goals of treatment are to reduce the symptoms of inflammation and to get rid of any infection. Treatment may include:  Home remedies and lifestyle changes, such as sitz baths and avoiding food right before bedtime.  Medicines, such as:  Topical ointments, foams, suppositories, or enemas, such as corticosteroids or anti-inflammatories.  Antibiotic or antiviral medicines to treat infection or to control harmful bacteria.  Medicines to control diarrhea, soften stools, and reduce pain.  Medicines to suppress the immune system.  Nutritional,  dietary, or herbal supplements.  Avoiding the activity that caused rectal trauma.  Heat or laser therapy for persistent bleeding.  A dilation procedure to enlarge a narrowed rectum.  Surgery to repair damaged rectal lining. This is rare. If your proctitis was caused by an STD, your health care provider may test you for infection again 3 months after treatment. Follow these instructions at home:  Take over-the-counter and prescription medicines only as told by your health care provider.  If you were prescribed an antibiotic medicine, take it as told by your health care provider. Do not stop taking the antibiotic even if you start to feel better.  Try to avoid eating right before bedtime.  Take sitz baths as told by your health care provider.  Keep all follow-up visits as told by your health care provider. This is important. Contact a health care provider if:  Your symptoms do not improve with treatment.  Your symptoms get worse.  You have a fever. This information is not intended to replace advice given to you by your health care provider. Make sure you discuss any questions you have with your health care provider. Document Released: 12/06/2011 Document Revised: 05/24/2016 Document Reviewed: 03/14/2015  2017 Elsevier    Fecal Impaction A fecal impaction is a large, firm amount of stool (feces) that will not pass out of the body. A fecal impaction usually occurs in the end of the large intestine (rectum). It can block the large intestine and cause significant problems. What are the causes? This condition may be caused by anything that slows down bowel movements, including:  Long-term use of medicines that help you have a  bowel movement (laxatives).  Constipation.  Pain in the rectum. Fecal impaction can occur if you avoid having bowel movements due to the pain. Pain in the rectum can result from a medical condition, such as hemorrhoids or anal fissures.  Narcotic  pain-relieving medicines, such as methadone, morphine, or codeine.  Not drinking enough fluids.  Being inactive for a long period of time.  Diseases of the brain or nervous system that damage nerves that control the muscles of the intestines. What are the signs or symptoms? Symptoms of this condition include:  Breathing problems.  Nausea, vomiting, and dehydration.  Dizziness.  Confusion.  Rapid heartbeat.  Fever.  Sweating.  Changes in blood pressure.  Not having a normal number of bowel movements.  Changes in bowel patterns. This may include going to the bathroom less often or not at all.  A sense of fullness in the rectum but being unable to pass stool.  Pain or cramps in the abdominal area. These often happen after meals.  Thin, watery discharge from the rectum. How is this diagnosed? This condition may be diagnosed based on your symptoms and an exam of your rectum. Sometimes X-rays or lab tests are done to confirm the diagnosis and to check for other problems. How is this treated? This condition may be treated by:  Having your health care provider remove the stool using a gloved finger.  Taking medicine.  A suppository or enema given in the rectum to soften the stool, which can stimulate a bowel movement. Follow these instructions at home: Eating and drinking  Drink enough fluid to keep your urine clear or pale yellow.  Include a lot of fiber in your diet. Foods with a lot of fiber include fruits, vegetables, and oatmeal.  If you begin to get constipated, increase the amount of fiber in your diet. General instructions  Develop bowel habits. An example of a bowel habit is having a bowel movement right after breakfast every day. Be sure to give yourself enough time on the toilet. This may require using enemas, bowel softeners, or suppositories at home, as directed by your health care provider. It may also include using mineral oil or olive oil.  Exercise  regularly.  Take over-the-counter and prescription medicines only as told by your health care provider. Contact a health care provider if:  You have ongoing pain in your rectum.  You need to use an enema or a suppository more than 2 times a week.  You have rectal bleeding.  You continue to have problems. The problems may include not being able to go to the bathroom and long-term (chronic) constipation.  You have pain in your abdomen.  You have thin, pencil-like stools. Get help right away if:  You have black or tarry stools. This information is not intended to replace advice given to you by your health care provider. Make sure you discuss any questions you have with your health care provider. Document Released: 09/08/2004 Document Revised: 07/20/2016 Document Reviewed: 06/21/2016 Elsevier Interactive Patient Education  2017 Elsevier Inc.   Additional instructions:  Please get your medications reviewed and adjusted by your Primary MD.  Please request your Primary MD to go over all Hospital Tests and Procedure/Radiological results at the follow up, please get all Hospital records sent to your Prim MD by signing hospital release before you go home.  If you had Pneumonia of Lung problems at the Hospital: Please get a 2 view Chest X ray done in 6-8 weeks after hospital discharge  or sooner if instructed by your Primary MD.  If you have Congestive Heart Failure: Please call your Cardiologist or Primary MD anytime you have any of the following symptoms:  1) 3 pound weight gain in 24 hours or 5 pounds in 1 week  2) shortness of breath, with or without a dry hacking cough  3) swelling in the hands, feet or stomach  4) if you have to sleep on extra pillows at night in order to breathe  Follow cardiac low salt diet and 1.5 lit/day fluid restriction.  If you have diabetes Accuchecks 4 times/day, Once in AM empty stomach and then before each meal. Log in all results and show them to  your primary doctor at your next visit. If any glucose reading is under 80 or above 300 call your primary MD immediately.  If you have Seizure/Convulsions/Epilepsy: Please do not drive, operate heavy machinery, participate in activities at heights or participate in high speed sports until you have seen by Primary MD or a Neurologist and advised to do so again.  If you had Gastrointestinal Bleeding: Please ask your Primary MD to check a complete blood count within one week of discharge or at your next visit. Your endoscopic/colonoscopic biopsies that are pending at the time of discharge, will also need to followed by your Primary MD.  Get Medicines reviewed and adjusted. Please take all your medications with you for your next visit with your Primary MD  Please request your Primary MD to go over all hospital tests and procedure/radiological results at the follow up, please ask your Primary MD to get all Hospital records sent to his/her office.  If you experience worsening of your admission symptoms, develop shortness of breath, life threatening emergency, suicidal or homicidal thoughts you must seek medical attention immediately by calling 911 or calling your MD immediately  if symptoms less severe.  You must read complete instructions/literature along with all the possible adverse reactions/side effects for all the Medicines you take and that have been prescribed to you. Take any new Medicines after you have completely understood and accpet all the possible adverse reactions/side effects.   Do not drive or operate heavy machinery when taking Pain medications.   Do not take more than prescribed Pain, Sleep and Anxiety Medications  Special Instructions: If you have smoked or chewed Tobacco  in the last 2 yrs please stop smoking, stop any regular Alcohol  and or any Recreational drug use.  Wear Seat belts while driving.  Please note You were cared for by a hospitalist during your hospital  stay. If you have any questions about your discharge medications or the care you received while you were in the hospital after you are discharged, you can call the unit and asked to speak with the hospitalist on call if the hospitalist that took care of you is not available. Once you are discharged, your primary care physician will handle any further medical issues. Please note that NO REFILLS for any discharge medications will be authorized once you are discharged, as it is imperative that you return to your primary care physician (or establish a relationship with a primary care physician if you do not have one) for your aftercare needs so that they can reassess your need for medications and monitor your lab values.  You can reach the hospitalist office at phone [phone] or fax [phone]   If you do not have a primary care physician, you can call [phone] for a physician referral.

## 2017-01-01 DIAGNOSIS — K6289 Other specified diseases of anus and rectum: Secondary | ICD-10-CM

## 2017-01-01 LAB — GLUCOSE, CAPILLARY
GLUCOSE-CAPILLARY: 98 mg/dL (ref 65–99)
Glucose-Capillary: 130 mg/dL — ABNORMAL HIGH (ref 65–99)
Glucose-Capillary: 170 mg/dL — ABNORMAL HIGH (ref 65–99)
Glucose-Capillary: 139 mg/dL — ABNORMAL HIGH (ref 65–99)

## 2017-01-01 NOTE — Progress Notes (Signed)
Seen and stable passing reg stool Very hungry no other issue Can d/c home today.  RN aware  Pleas KochJai Shiquan Mathieu, MD [facility] [phone]) [phone]

## 2017-01-01 NOTE — Care Management Obs Status (Signed)
MEDICARE OBSERVATION STATUS NOTIFICATION   Patient Details  Name: Scharlene GlossBarbara J Dray MRN: [id number] Date of Birth: Mar 30, 1941   Medicare Observation Status Notification Given:  Yes    Lawerance Sabalebbie Nikolette Reindl, RN 01/01/2017, 2:07 PM

## 2017-01-01 NOTE — Care Management Note (Signed)
Case Management Note  Patient Details  Name: [name] MRN: [id number] Date of Birth: [birth year]  Subjective/Objective:                 Spoke with patient at the bedside, she states that she lives with her son, has HHA at home, could not name company. [facility], verified with clinical liaison Philis FendtMary Y. RN. [facility] Patient states she has RW at home already. Could not get a hold of son [name] on the phone.   Action/Plan:  [facility]   Expected Discharge Date:                  Expected Discharge Plan:  Home w Home Health Services  In-House Referral:     Discharge planning Services  CM Consult  Post Acute Care Choice:  Home Health Choice offered to:  Patient  DME Arranged:    DME Agency:     HH Arranged:  PT, Nurse's Aide [facility]  Status of Service:  Completed, signed off  If discussed at [employer] Length of Stay Meetings, dates discussed:    Additional Comments:  Lawerance SabalDebbie Tyyonna Soucy, RN 01/01/2017, 10:48 AM

## 2017-01-01 NOTE — Progress Notes (Signed)
Scharlene GlossBarbara J Scull  D/C'd per MD order.  Discussed with the patient, and family all questions fully answered.  VSS, Skin clean, dry and intact without evidence of skin break down, no evidence of skin tears noted. IV catheter discontinued intact. Site without signs and symptoms of complications. Dressing and pressure applied.  An After Visit Summary was printed and given to the patient. Patient received prescription.  D/c education completed with patient/family including follow up instructions, medication list, d/c activities limitations if indicated, with other d/c instructions as indicated by MD - patient, and family able to verbalize understanding, all questions fully answered.   Patient instructed to return to ED, call 911, or call MD for any changes in condition.   Patient escorted via WC, and D/C home via private auto.  Melvenia Needlesreti O Azelea Seguin 01/01/2017 5:12 PM

## 2017-01-10 ENCOUNTER — Ambulatory Visit (INDEPENDENT_AMBULATORY_CARE_PROVIDER_SITE_OTHER): Payer: Medicare Other | Admitting: Cardiology

## 2017-01-10 ENCOUNTER — Encounter (INDEPENDENT_AMBULATORY_CARE_PROVIDER_SITE_OTHER): Payer: Self-pay

## 2017-01-10 ENCOUNTER — Encounter: Payer: Self-pay | Admitting: Cardiology

## 2017-01-10 VITALS — BP 116/82 | HR 78 | Ht 64.0 in | Wt 114.0 lb

## 2017-01-10 DIAGNOSIS — I440 Atrioventricular block, first degree: Secondary | ICD-10-CM

## 2017-01-10 DIAGNOSIS — I340 Nonrheumatic mitral (valve) insufficiency: Secondary | ICD-10-CM

## 2017-01-10 DIAGNOSIS — I517 Cardiomegaly: Secondary | ICD-10-CM

## 2017-01-10 DIAGNOSIS — I10 Essential (primary) hypertension: Secondary | ICD-10-CM | POA: Diagnosis not present

## 2017-01-10 NOTE — Patient Instructions (Signed)

## 2017-01-10 NOTE — Progress Notes (Signed)
[address] [place], [place]  [address] Phone: [phone] Fax:  [phone]  Date:  01/10/2017   ID:  Dana Blair, Dana Blair [birth year], MRN [id number]  PCP:  Leanor Rubenstein, MD   History of Present Illness: Dana Blair is a 76 y.o. female with difficult to control hypertension, diabetes, prior CVA, hyperlipidemia here for followup hypertension and hospital follow-up.  She had an episode of syncope , echo was normal, acute kidney injury resolved likely from dehydration and urinary tract infection resolved as well. She had a biopsy secondary to rectal thickening that was normal, Dr. Dulce Sellar. Proctitis-rectal bleeding admission 12/2016.  Malnutrition noted.   Hypertension- She does have a mild first degree AV block on EKG and she is on beta blocker. We will carefully monitor.   Hyperlipidemia-currently very well controlled with LDL of 85. Continuing with Vytorin  Her son is very upbeat. Thankful.   Wt Readings from Last 3 Encounters:  01/10/17 114 lb (51.7 kg)  01/01/17 120 lb 14.4 oz (54.8 kg)  09/28/16 120 lb 4.8 oz (54.6 kg)     Past Medical History:  Diagnosis Date  . Abnormality of gait 05/05/2016  . Acidosis   . Acute respiratory failure (HCC)   . Atrial flutter (HCC) 05/05/2016  . Atrioventricular bloc first degree   . Chronic diastolic heart failure (HCC)   . Chronic kidney disease   . Debility 05/05/2016  . Dehydration   . Dementia 05/05/2016  . Diabetes (HCC)    Diabetes mellitus with chronic kidney disease   . Dysphagia 05/05/2016  . Elevated white blood cell count   . Epilepsy with status epilepticus, not intractable (HCC)   . Glaucoma   . Hydronephrosis   . Hyperlipidemia   . Hypertension    [facility]- ECHO 06/29/09 - LVH, normal EF, renal ultrasound-no renal artery stenosis  . Orthostatic hypotension   . Osteopenia   . 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 She had concomitant respiratory failure. On May 22, 2008, CT of  the head without contrast demonstrated a 9 x 20 mm acute hematoma within the head of the right caudate with intraventricular extension of hemorrhage. Neurosurgery consult-no ventriculostomy.  . Syncope 07/26/2015  . Unspecified severe protein-calorie malnutrition (HCC)     Past Surgical History:  Procedure Laterality Date  . ABDOMINAL HYSTERECTOMY    . FLEXIBLE SIGMOIDOSCOPY N/A 07/30/2015   Procedure: FLEXIBLE SIGMOIDOSCOPY;  Surgeon: Dorena Cookey, MD;  Location: [facility] ENDOSCOPY;  Service: Endoscopy;  Laterality: N/A;  2 fleets enemas at 7:30 AM on July 30    Current Outpatient Prescriptions  Medication Sig Dispense Refill  . acetaminophen (TYLENOL) 325 MG tablet Take 2 tablets (650 mg total) by mouth every 6 (six) hours as needed for mild pain, moderate pain, fever or headache (or Fever >/= 101).    Marland Kitchen amLODipine (NORVASC) 10 MG tablet TAKE ONE TABLET BY MOUTH ONCE DAILY TO CONTROL BLOOD PRESSURE 30 tablet 0  . aspirin EC 325 MG tablet Take 1 tablet (325 mg total) by mouth daily. 30 tablet 0  . atorvastatin (LIPITOR) 20 MG tablet TAKE ONE TABLET BY MOUTH ONCE DAILY TO  LOWER  CHOLESTEROL 30 tablet 2  . bisacodyl (DULCOLAX) 10 MG suppository Place 1 suppository (10 mg total) rectally daily as needed for moderate constipation. 12 suppository 0  . ciprofloxacin (CIPRO) 500 MG tablet Take 1 tablet (500 mg total) by mouth 2 (two) times daily.  6 tablet 0  . levETIRAcetam (KEPPRA) 500 MG tablet Take 2 tablets (1,000 mg total) by mouth 2 (two) times daily. 120 tablet 5  . memantine (NAMENDA XR) 28 MG CP24 24 hr capsule Take 1 capsule (28 mg total) by mouth daily. 30 capsule 3  . metroNIDAZOLE (FLAGYL) 500 MG tablet Take 1 tablet (500 mg total) by mouth 3 (three) times daily. 9 tablet 0  . nebivolol (BYSTOLIC) 5 MG tablet Take 1 tablet (5 mg total) by mouth daily. 30 tablet 0  . polyethylene glycol (MIRALAX / GLYCOLAX) packet Take 17 g by mouth 2 (two) times daily. 30 each 0  . senna-docusate (SENOKOT-S)  8.6-50 MG tablet Take 2 tablets by mouth at bedtime. 60 tablet 0  . SITagliptin Phosphate (JANUVIA PO) Take 1 tablet by mouth daily.     No current facility-administered medications for this visit.     Allergies:   No Known Allergies  Social History:  The patient  reports that she has never smoked. She has never used smokeless tobacco. She reports that she does not drink alcohol or use drugs.   Family History  Problem Relation Age of Onset  . Diabetes Mother   . Diabetes Father   . Diabetes Sister     ROS:  Please see the history of present illness.   Denies any new strokelike symptoms, fevers, chills, orthopnea, PND   All other systems reviewed and negative.   PHYSICAL EXAM: VS:  BP 116/82   Pulse 78   Ht 5\' 4"  (1.626 m)   Wt 114 lb (51.7 kg)   LMP  (LMP Unknown)   BMI 19.57 kg/m   Thin, tremulous, in no acute distress  HEENT: normal, [place]/AT, EOMI Neck: no JVD, normal carotid upstroke, no bruit Cardiac:  normal S1, S2; RRR; 2/6 apical systolic murmur  Lungs:  clear to auscultation bilaterally, no wheezing, rhonchi or rales kyphosis Abd: soft, nontender, no hepatomegaly, no bruits  Ext: no edema, 2+ distal pulses Skin: warm and dry  GU: deferred Neuro:  tremulous, AAO x 3  EKG:  08/13/14-sinus rhythm, first degree AV block, PR interval 214 ms, poor R-wave progression     Labs: A1c-6.2, LDL 85  ECHO: 05/01/16: - Left ventricle: The cavity size was normal. There was mild   concentric hypertrophy. Systolic function was normal. The   estimated ejection fraction was in the range of 60% to 65%. There   was dynamic obstruction in the mid cavity, with a peak velocity   of 246.1 cm/sec and a peak gradient of 24 mm Hg. Wall motion was   normal; there were no regional wall motion abnormalities. Doppler   parameters are consistent with abnormal left ventricular   relaxation (grade 1 diastolic dysfunction). - Aortic valve: Transvalvular velocity was within the normal range.   There  was no stenosis. There was no regurgitation. - Mitral valve: Transvalvular velocity was within the normal range.   There was no evidence for stenosis. There was no regurgitation. (prior mild) - Right ventricle: The cavity size was normal. Wall thickness was   normal. Systolic function was normal. - Pericardium, extracardiac: A trivial pericardial effusion was   identified. Features were not consistent with tamponade   physiology.  ASSESSMENT AND PLAN:  History of stroke-hospitalization in April 2017. Presented as status epilepticus. syncope-telemetry unremarkable in hospital, echo normal EF. Likely dehydration. Continue to encourage nutrition. Hypertension-previously difficult to control. Currently doing very well on this regimen. Mitral regurgitation-mild. Mild tricuspid regurgitation as well.  Should be of no clinical significance. Diabetes-well controlled. Dr. [name]. Hyperlipidemia -- Vytorin 10/20. LDL 85. Continue. Post stroke. 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 Continue to monitor. I'm fine with her continuing with beta blocker. nutrition malnutrition -encourage protein uptake  44-month followup.  Signed, [name], MD [facility]  01/10/2017 4:37 PM

## 2017-02-10 ENCOUNTER — Other Ambulatory Visit: Payer: Self-pay | Admitting: Cardiology

## 2017-02-12 ENCOUNTER — Other Ambulatory Visit: Payer: Self-pay | Admitting: Cardiology

## 2017-02-12 MED ORDER — AMLODIPINE BESYLATE 10 MG PO TABS: ORAL_TABLET | ORAL | 10 refills | Status: DC

## 2017-02-12 MED ORDER — NEBIVOLOL HCL 5 MG PO TABS: 5.0000 mg | ORAL_TABLET | Freq: Every day | ORAL | 10 refills | Status: DC

## 2017-03-29 ENCOUNTER — Ambulatory Visit: Payer: Medicare Other | Admitting: Neurology

## 2017-04-02 ENCOUNTER — Ambulatory Visit: Payer: Medicare Other | Admitting: Neurology

## 2017-04-02 DIAGNOSIS — Z029 Encounter for administrative examinations, unspecified: Secondary | ICD-10-CM

## 2017-04-03 ENCOUNTER — Encounter: Payer: Self-pay | Admitting: Neurology

## 2017-04-08 ENCOUNTER — Other Ambulatory Visit: Payer: Self-pay | Admitting: Internal Medicine

## 2017-07-21 ENCOUNTER — Other Ambulatory Visit: Payer: Self-pay | Admitting: Neurology

## 2017-09-29 IMAGING — MR MR HEAD W/O CM
9 of 11 series · 34 of 48 positions shown · non-contrast
Comparison: Prior studies from earlier same day as well as previous
brain MRI from 07/27/2015.

CLINICAL DATA: Initial evaluation for acute seizure, right gaze
deviation.

EXAM:
MRI HEAD WITHOUT CONTRAST
TECHNIQUE: Multiplanar, multiecho pulse sequences of the brain and surrounding
structures were obtained without intravenous contrast.

[Series 3: T1 · sagittal · 5.0mm · 0.47mm/px · 2 of 24 slices shown]
[im 1/24]
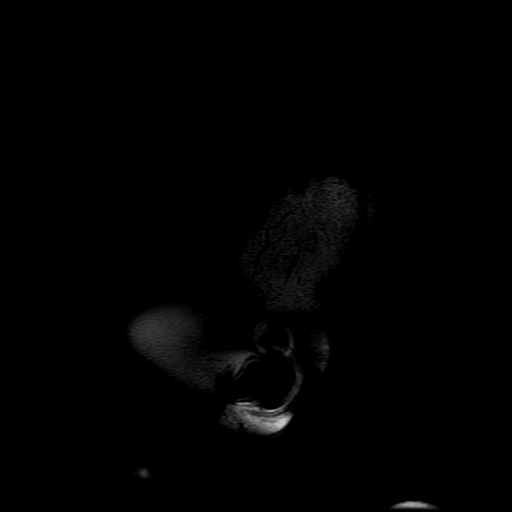
[im 12/24]
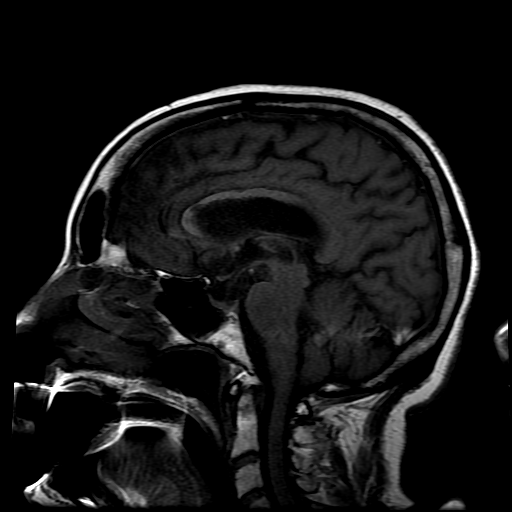

[Series 4: DWI · axial · 3.0mm · 1.09mm/px · z∈[-46,+87]mm · 8 of 94 slices shown (1 of 4)]
[im 1/94]
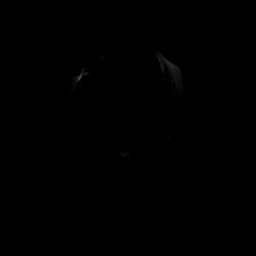
[im 11/94]
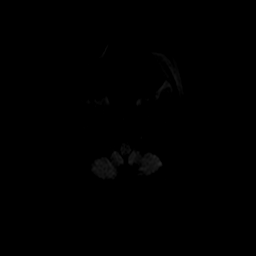
[im 32/94]
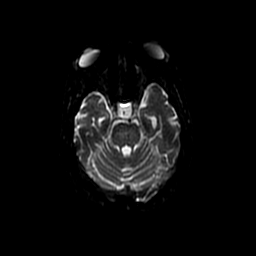
[im 42/94]
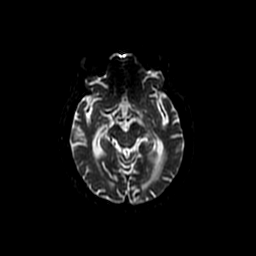
[im 52/94]
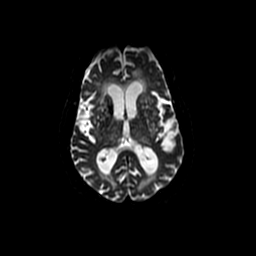
[im 63/94]
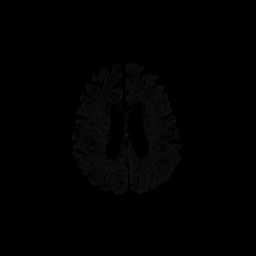
[im 83/94]
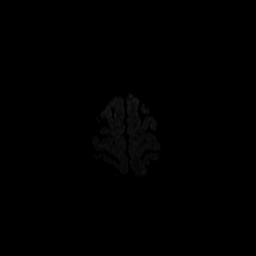
[im 94/94]
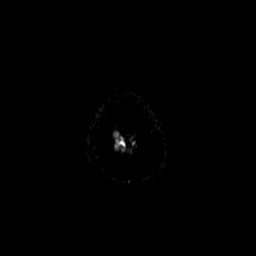

[Series 5: DWI · coronal · 5.0mm · 1.09mm/px · 7 of 66 slices shown (2 of 4)]
[im 1/66]
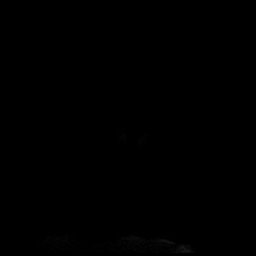
[im 11/66]
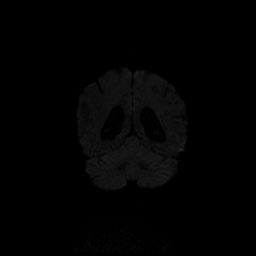
[im 22/66]
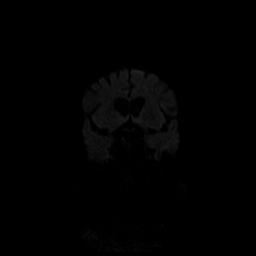
[im 33/66]
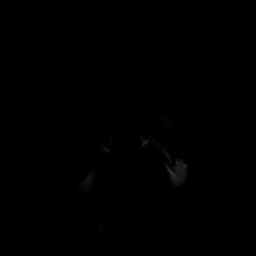
[im 44/66]
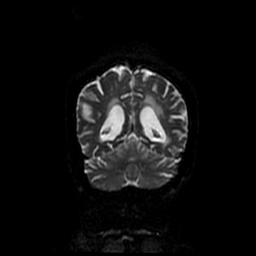
[im 55/66]
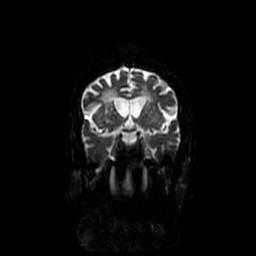
[im 66/66]
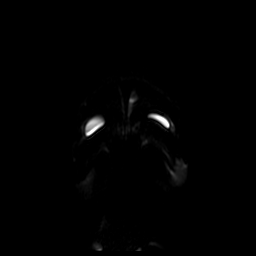

[Series 6: T2 · axial · 5.0mm · 0.43mm/px · z∈[-49,+84]mm · 2 of 24 slices shown (1 of 3)]
[im 1/24]
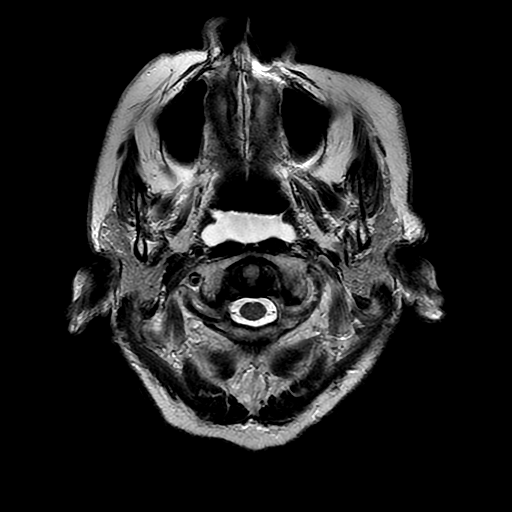
[im 24/24]
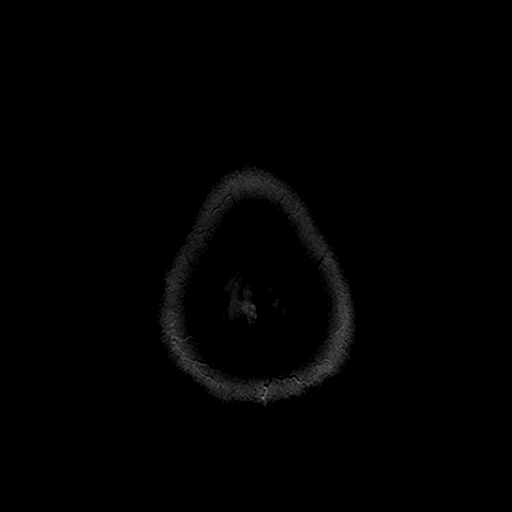

[Series 7: FLAIR · axial · 5.0mm · 0.43mm/px · z∈[-49,+84]mm · 2 of 24 slices shown]
[im 1/24]
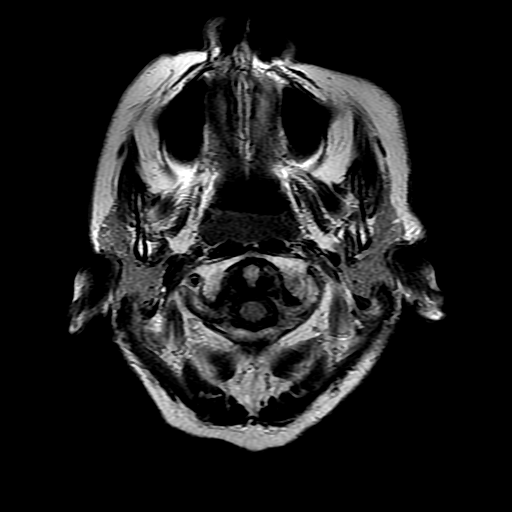
[im 24/24]
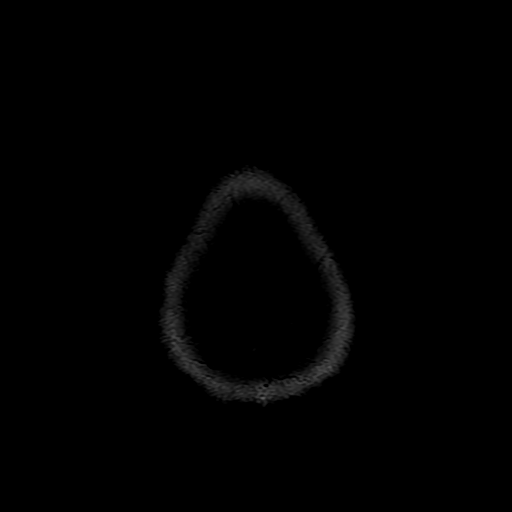

[Series 10: T2 · coronal · 5.0mm · 0.39mm/px · 2 of 25 slices shown (2 of 3)]
[im 1/25]
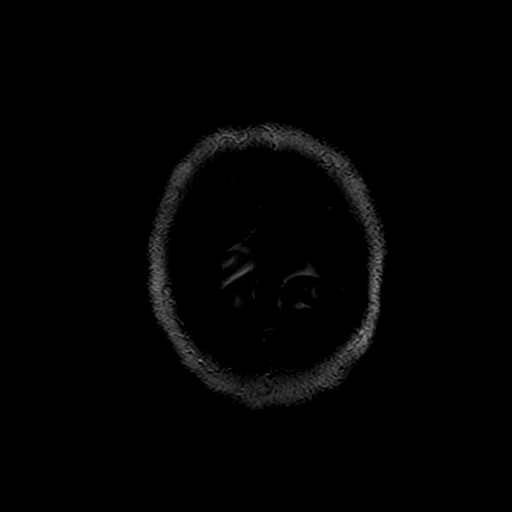
[im 25/25]
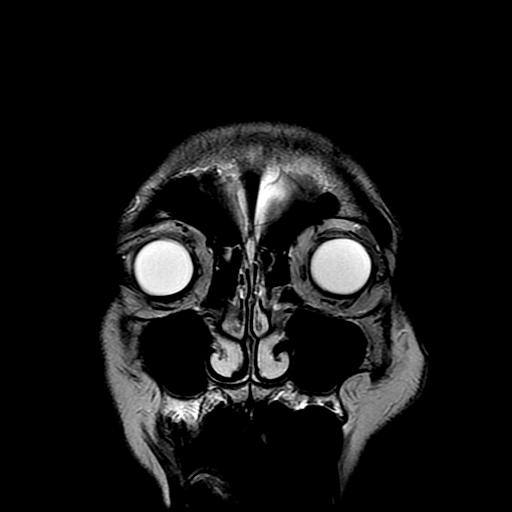

[Series 11: T2 · coronal · 3.0mm · 0.35mm/px · 3 of 29 slices shown (3 of 3)]
[im 1/29]
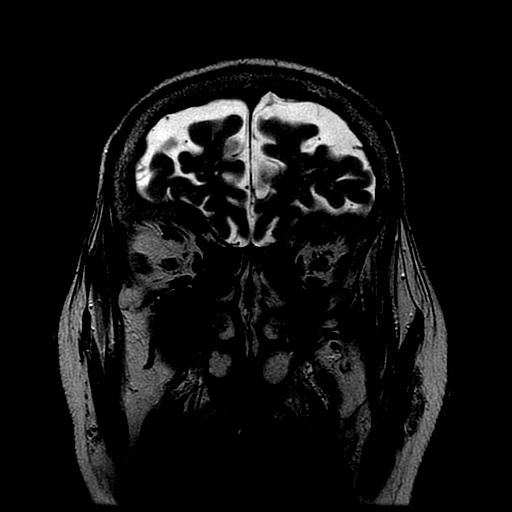
[im 15/29]
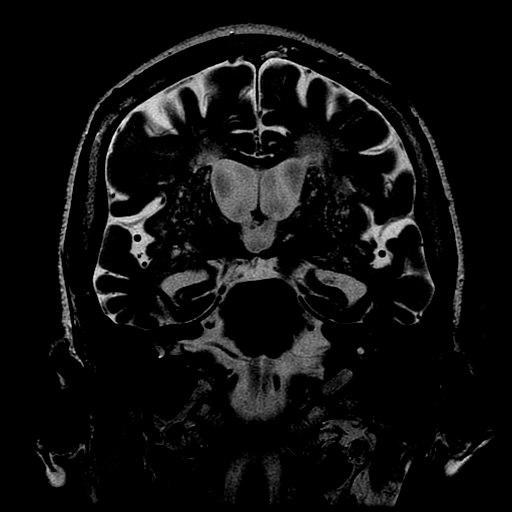
[im 29/29]
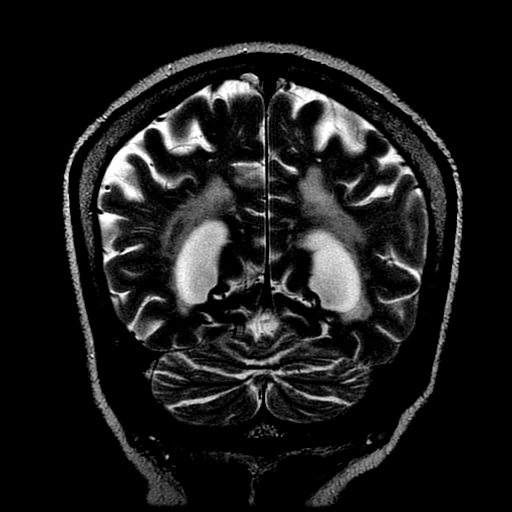

[Series 400: DWI · axial · 3.0mm · 1.09mm/px · z∈[-46,+87]mm · 5 of 47 slices shown (3 of 4)]
[im 1/47]
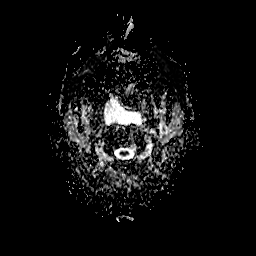
[im 12/47]
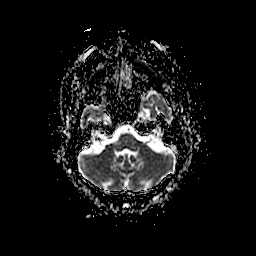
[im 24/47]
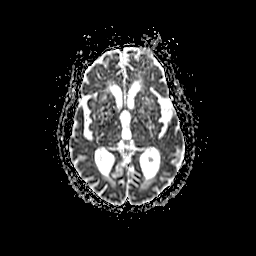
[im 35/47]
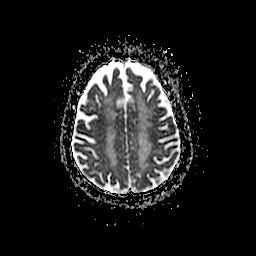
[im 47/47]
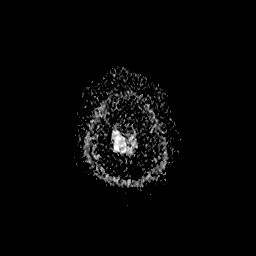

[Series 500: DWI · coronal · 5.0mm · 1.09mm/px · 3 of 33 slices shown (4 of 4)]
[im 1/33]
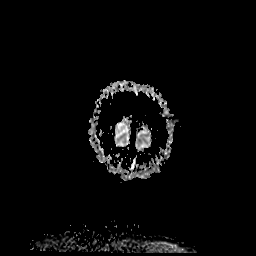
[im 17/33]
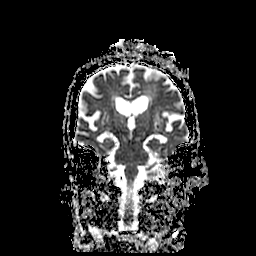
[im 33/33]
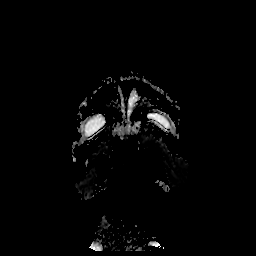

[34 of 48 positions shown; findings below may reference images not displayed]

FINDINGS: Diffuse prominence of the CSF containing spaces is compatible with
generalized age-related cerebral atrophy, progressed relative to
most recent MRI. Confluent T2/FLAIR hyperintensity involving the
periventricular and deep greater than subcortical white matter has
also progressed relative to prior. Chronic small vessel ischemic
changes present within the pons. Numerous dilated perivascular
spaces within the basal ganglia, similar to prior.

Remote lacunar infarct with chronic hemosiderin staining present
within the body of the right caudate/right internal capsule.
Additional small chronic lacunar infarctions within the bilateral
thalami. Small chronic micro hemorrhages within the pons as well.
Probable additional tiny remote left cerebellar infarct.

Mildly hyperintense 9 mm diffusion abnormality present within the
right periatrial white matter (series 4, image 26). This likely
reflects the sequela of acute/early subacute small vessel ischemia.
No other acute infarct. Gray-white matter differentiation
maintained. Major intracranial vascular flow voids are preserved. No
acute intracranial hemorrhage.

The no mass lesion, midline shift, or mass effect. Ventricular
prominence related to global parenchymal volume loss of
hydrocephalus. Hippocampi are symmetric in size and appearance with
normal signal intensity and morphology. No extra-axial fluid
collection. Scattered FLAIR sequence seen throughout the
supratentorial brain on this exam favored to related to motion
artifact.

Craniocervical junction normal. Visualized upper cervical spine
unremarkable.

Incidental note made of an empty sella. No acute abnormality about
the orbits. Sequela prior lens extraction noted bilaterally.

Fluid within the posterior nasopharynx. Patient is intubated.
Paranasal sinuses are otherwise clear. Trace opacity within the
right mastoid air cells. Mastoid air cells are otherwise clear.
Inner ear structures normal.

Bone marrow signal intensity normal. No scalp soft tissue
abnormality.
IMPRESSION: 1. Mildly hyperintense 9 mm diffusion abnormality within the right
periatrial white matter, likely reflecting acute/early subacute
small vessel ischemia.
2. No other acute intracranial process.
3. Moderate cerebral atrophy with chronic small vessel ischemic
disease, progressed relative to most recent MRI from [DATE]
07/27/2015.
[DATE]. Chronic hemorrhagic right basal ganglia infarct, with additional
chronic lacunar infarcts within the thalami, stable.

## 2017-11-29 ENCOUNTER — Other Ambulatory Visit: Payer: Self-pay | Admitting: Neurology

## 2018-02-15 ENCOUNTER — Other Ambulatory Visit: Payer: Self-pay | Admitting: Cardiology

## 2018-03-05 ENCOUNTER — Telehealth: Payer: Self-pay | Admitting: Cardiology

## 2018-03-05 ENCOUNTER — Other Ambulatory Visit: Payer: Self-pay | Admitting: *Deleted

## 2018-03-05 MED ORDER — AMLODIPINE BESYLATE 10 MG PO TABS: ORAL_TABLET | ORAL | 0 refills | Status: DC

## 2018-03-05 MED ORDER — NEBIVOLOL HCL 5 MG PO TABS: 5.0000 mg | ORAL_TABLET | Freq: Every day | ORAL | 0 refills | Status: DC

## 2018-03-05 NOTE — Telephone Encounter (Signed)
New Message   *STAT* If patient is at the pharmacy, call can be transferred to refill team.   1. Which medications need to be refilled? (please list name of each medication and dose if known) nebivolol (BYSTOLIC) 5 MG tablet and amLODipine (NORVASC) 10 MG tablet  2. Which pharmacy/location (including street and city if local pharmacy) is medication to be sent to? [id number] - [place] ([place]), [place] - [address]   3. Do they need a 30 day or 90 day supply? 90

## 2018-04-04 ENCOUNTER — Ambulatory Visit: Payer: Medicare Other | Admitting: Cardiology

## 2018-04-04 ENCOUNTER — Other Ambulatory Visit: Payer: Self-pay | Admitting: Cardiology

## 2018-04-04 MED ORDER — NEBIVOLOL HCL 5 MG PO TABS: 5.0000 mg | ORAL_TABLET | Freq: Every day | ORAL | 0 refills | Status: DC

## 2018-04-04 NOTE — Telephone Encounter (Signed)
New Message   *STAT* If patient is at the pharmacy, call can be transferred to refill team.   1. Which medications need to be refilled? (please list name of each medication and dose if known) nebivolol (BYSTOLIC) 5 MG tablet, amLODipine (NORVASC) 10 MG tablet  2. Which pharmacy/location (including street and city if local pharmacy) is medication to be sent to? [id number] - [place] ([place]), [place] - [address]  3. Do they need a 30 day or 90 day supply? 90

## 2018-04-04 NOTE — Telephone Encounter (Signed)
Pt's medication was sent to pt's pharmacy as requested. Confirmation received.  °

## 2018-04-19 ENCOUNTER — Other Ambulatory Visit: Payer: Self-pay

## 2018-04-19 ENCOUNTER — Emergency Department (HOSPITAL_COMMUNITY): Payer: Medicare Other

## 2018-04-19 ENCOUNTER — Observation Stay (HOSPITAL_COMMUNITY)
Admission: EM | Admit: 2018-04-19 | Discharge: 2018-04-21 | Disposition: A | Discharge status: Home / Self Care | Payer: Medicare Other | Attending: Internal Medicine | Admitting: Internal Medicine

## 2018-04-19 ENCOUNTER — Encounter (HOSPITAL_COMMUNITY): Payer: Self-pay

## 2018-04-19 DIAGNOSIS — I700 Atherosclerosis of aorta: Secondary | ICD-10-CM | POA: Insufficient documentation

## 2018-04-19 DIAGNOSIS — I440 Atrioventricular block, first degree: Secondary | ICD-10-CM | POA: Insufficient documentation

## 2018-04-19 DIAGNOSIS — H409 Unspecified glaucoma: Secondary | ICD-10-CM | POA: Diagnosis not present

## 2018-04-19 DIAGNOSIS — F0390 Unspecified dementia without behavioral disturbance: Secondary | ICD-10-CM | POA: Diagnosis not present

## 2018-04-19 DIAGNOSIS — G40909 Epilepsy, unspecified, not intractable, without status epilepticus: Secondary | ICD-10-CM | POA: Insufficient documentation

## 2018-04-19 DIAGNOSIS — R296 Repeated falls: Secondary | ICD-10-CM | POA: Insufficient documentation

## 2018-04-19 DIAGNOSIS — I4892 Unspecified atrial flutter: Secondary | ICD-10-CM | POA: Diagnosis not present

## 2018-04-19 DIAGNOSIS — E1100 Type 2 diabetes mellitus with hyperosmolarity without nonketotic hyperglycemic-hyperosmolar coma (NKHHC): Secondary | ICD-10-CM | POA: Diagnosis not present

## 2018-04-19 DIAGNOSIS — Z8673 Personal history of transient ischemic attack (TIA), and cerebral infarction without residual deficits: Secondary | ICD-10-CM | POA: Diagnosis not present

## 2018-04-19 DIAGNOSIS — E785 Hyperlipidemia, unspecified: Secondary | ICD-10-CM | POA: Insufficient documentation

## 2018-04-19 DIAGNOSIS — I10 Essential (primary) hypertension: Secondary | ICD-10-CM

## 2018-04-19 DIAGNOSIS — I5032 Chronic diastolic (congestive) heart failure: Secondary | ICD-10-CM | POA: Insufficient documentation

## 2018-04-19 DIAGNOSIS — E876 Hypokalemia: Secondary | ICD-10-CM | POA: Diagnosis not present

## 2018-04-19 DIAGNOSIS — E119 Type 2 diabetes mellitus without complications: Secondary | ICD-10-CM

## 2018-04-19 DIAGNOSIS — E1122 Type 2 diabetes mellitus with diabetic chronic kidney disease: Secondary | ICD-10-CM | POA: Insufficient documentation

## 2018-04-19 DIAGNOSIS — N189 Chronic kidney disease, unspecified: Secondary | ICD-10-CM | POA: Insufficient documentation

## 2018-04-19 DIAGNOSIS — Z7982 Long term (current) use of aspirin: Secondary | ICD-10-CM | POA: Diagnosis not present

## 2018-04-19 DIAGNOSIS — M8580 Other specified disorders of bone density and structure, unspecified site: Secondary | ICD-10-CM | POA: Diagnosis not present

## 2018-04-19 DIAGNOSIS — I674 Hypertensive encephalopathy: Principal | ICD-10-CM | POA: Diagnosis present

## 2018-04-19 DIAGNOSIS — I130 Hypertensive heart and chronic kidney disease with heart failure and stage 1 through stage 4 chronic kidney disease, or unspecified chronic kidney disease: Secondary | ICD-10-CM | POA: Insufficient documentation

## 2018-04-19 DIAGNOSIS — Z794 Long term (current) use of insulin: Secondary | ICD-10-CM

## 2018-04-19 LAB — URINALYSIS, ROUTINE W REFLEX MICROSCOPIC
BILIRUBIN URINE: NEGATIVE
GLUCOSE, UA: 50 mg/dL — AB
KETONES UR: NEGATIVE mg/dL
LEUKOCYTES UA: NEGATIVE
NITRITE: NEGATIVE
PROTEIN: 100 mg/dL — AB
Specific Gravity, Urine: 1.012 (ref 1.005–1.030)
pH: 6 (ref 5.0–8.0)

## 2018-04-19 LAB — CBC WITH DIFFERENTIAL/PLATELET
BASOS ABS: 0 10*3/uL (ref 0.0–0.1)
BASOS PCT: 0 %
EOS PCT: 0 %
Eosinophils Absolute: 0 10*3/uL (ref 0.0–0.7)
HCT: 45.2 % (ref 36.0–46.0)
Hemoglobin: 15.4 g/dL — ABNORMAL HIGH (ref 12.0–15.0)
Lymphocytes Relative: 23 %
Lymphs Abs: 1.8 10*3/uL (ref 0.7–4.0)
MCH: 28.6 pg (ref 26.0–34.0)
MCHC: 34.1 g/dL (ref 30.0–36.0)
MCV: 83.9 fL (ref 78.0–100.0)
MONO ABS: 0.4 10*3/uL (ref 0.1–1.0)
MONOS PCT: 5 %
Neutro Abs: 5.5 10*3/uL (ref 1.7–7.7)
Neutrophils Relative %: 72 %
PLATELETS: 245 10*3/uL (ref 150–400)
RBC: 5.39 MIL/uL — ABNORMAL HIGH (ref 3.87–5.11)
RDW: 14 % (ref 11.5–15.5)
WBC: 7.6 10*3/uL (ref 4.0–10.5)

## 2018-04-19 LAB — BASIC METABOLIC PANEL
ANION GAP: 13 (ref 5–15)
ANION GAP: 12 (ref 5–15)
BUN: 18 mg/dL (ref 6–20)
BUN: 19 mg/dL (ref 6–20)
CALCIUM: 9.2 mg/dL (ref 8.9–10.3)
CHLORIDE: 102 mmol/L (ref 101–111)
CO2: 24 mmol/L (ref 22–32)
CO2: 26 mmol/L (ref 22–32)
CREATININE: 0.92 mg/dL (ref 0.44–1.00)
Calcium: 9.3 mg/dL (ref 8.9–10.3)
Chloride: 101 mmol/L (ref 101–111)
Creatinine, Ser: 1.11 mg/dL — ABNORMAL HIGH (ref 0.44–1.00)
GFR calc non Af Amer: 47 mL/min — ABNORMAL LOW (ref >60)
GFR, EST AFRICAN AMERICAN: 54 mL/min — AB (ref >60)
GFR, EST NON AFRICAN AMERICAN: 59 mL/min — AB (ref >60)
GLUCOSE: 200 mg/dL — AB (ref 65–99)
Glucose, Bld: 177 mg/dL — ABNORMAL HIGH (ref 65–99)
POTASSIUM: 2.9 mmol/L — AB (ref 3.5–5.1)
Potassium: 2.5 mmol/L — CL (ref 3.5–5.1)
Sodium: 138 mmol/L (ref 135–145)
Sodium: 140 mmol/L (ref 135–145)

## 2018-04-19 LAB — I-STAT TROPONIN, ED: Troponin i, poc: 0.01 ng/mL (ref 0.00–0.08)

## 2018-04-19 LAB — GLUCOSE, CAPILLARY
GLUCOSE-CAPILLARY: 167 mg/dL — AB (ref 65–99)
GLUCOSE-CAPILLARY: 116 mg/dL — AB (ref 65–99)

## 2018-04-19 LAB — CBG MONITORING, ED
GLUCOSE-CAPILLARY: 216 mg/dL — AB (ref 65–99)
Glucose-Capillary: 254 mg/dL — ABNORMAL HIGH (ref 65–99)

## 2018-04-19 MED ORDER — POTASSIUM CHLORIDE 10 MEQ/100ML IV SOLN
10.0000 meq | Freq: Once | INTRAVENOUS | Status: AC
  Administered 2018-04-19: 10 meq via INTRAVENOUS
  Filled 2018-04-19: qty 100

## 2018-04-19 MED ORDER — INSULIN ASPART 100 UNIT/ML ~~LOC~~ SOLN
0.0000 [IU] | Freq: Three times a day (TID) | SUBCUTANEOUS | Status: DC
  Administered 2018-04-19: 3 [IU] via SUBCUTANEOUS
  Administered 2018-04-19: 2 [IU] via SUBCUTANEOUS
  Administered 2018-04-19: 5 [IU] via SUBCUTANEOUS
  Administered 2018-04-20 (×2): 3 [IU] via SUBCUTANEOUS
  Administered 2018-04-20: 1 [IU] via SUBCUTANEOUS
  Administered 2018-04-21: 2 [IU] via SUBCUTANEOUS
  Administered 2018-04-21: 3 [IU] via SUBCUTANEOUS
  Filled 2018-04-19 (×2): qty 1

## 2018-04-19 MED ORDER — LABETALOL HCL 5 MG/ML IV SOLN
20.0000 mg | Freq: Once | INTRAVENOUS | Status: AC
  Administered 2018-04-19: 20 mg via INTRAVENOUS
  Filled 2018-04-19: qty 4

## 2018-04-19 MED ORDER — AMLODIPINE BESYLATE 10 MG PO TABS
10.0000 mg | ORAL_TABLET | Freq: Every day | ORAL | Status: DC
  Administered 2018-04-19 – 2018-04-21 (×3): 10 mg via ORAL
  Filled 2018-04-19 (×2): qty 1
  Filled 2018-04-19: qty 2

## 2018-04-19 MED ORDER — ACETAMINOPHEN 325 MG PO TABS
650.0000 mg | ORAL_TABLET | Freq: Four times a day (QID) | ORAL | Status: DC | PRN
  Administered 2018-04-20 – 2018-04-21 (×2): 650 mg via ORAL
  Filled 2018-04-19 (×2): qty 2

## 2018-04-19 MED ORDER — ENOXAPARIN SODIUM 40 MG/0.4ML ~~LOC~~ SOLN
40.0000 mg | SUBCUTANEOUS | Status: DC
  Administered 2018-04-19 – 2018-04-20 (×2): 40 mg via SUBCUTANEOUS
  Filled 2018-04-19 (×4): qty 0.4

## 2018-04-19 MED ORDER — ASPIRIN EC 325 MG PO TBEC
325.0000 mg | DELAYED_RELEASE_TABLET | Freq: Every day | ORAL | Status: DC
  Administered 2018-04-19 – 2018-04-21 (×3): 325 mg via ORAL
  Filled 2018-04-19 (×3): qty 1

## 2018-04-19 MED ORDER — HYDRALAZINE HCL 20 MG/ML IJ SOLN
5.0000 mg | Freq: Four times a day (QID) | INTRAMUSCULAR | Status: DC | PRN
  Administered 2018-04-20: 5 mg via INTRAVENOUS
  Filled 2018-04-19 (×2): qty 1

## 2018-04-19 MED ORDER — ONDANSETRON HCL 4 MG/2ML IJ SOLN: 4.0000 mg | Freq: Four times a day (QID) | INTRAMUSCULAR | Status: DC | PRN

## 2018-04-19 MED ORDER — AMLODIPINE BESYLATE 5 MG PO TABS
10.0000 mg | ORAL_TABLET | Freq: Once | ORAL | Status: DC
  Filled 2018-04-19: qty 2

## 2018-04-19 MED ORDER — POTASSIUM CHLORIDE CRYS ER 20 MEQ PO TBCR
40.0000 meq | EXTENDED_RELEASE_TABLET | Freq: Once | ORAL | Status: AC
  Administered 2018-04-19: 40 meq via ORAL
  Filled 2018-04-19: qty 2

## 2018-04-19 MED ORDER — ONDANSETRON HCL 4 MG PO TABS: 4.0000 mg | ORAL_TABLET | Freq: Four times a day (QID) | ORAL | Status: DC | PRN

## 2018-04-19 MED ORDER — NEBIVOLOL HCL 5 MG PO TABS
5.0000 mg | ORAL_TABLET | Freq: Once | ORAL | Status: DC
  Filled 2018-04-19: qty 1

## 2018-04-19 MED ORDER — NEBIVOLOL HCL 5 MG PO TABS
5.0000 mg | ORAL_TABLET | Freq: Every day | ORAL | Status: DC
  Administered 2018-04-19 – 2018-04-20 (×2): 5 mg via ORAL
  Filled 2018-04-19 (×2): qty 1

## 2018-04-19 MED ORDER — HYDRALAZINE HCL 20 MG/ML IJ SOLN
20.0000 mg | INTRAMUSCULAR | Status: AC
  Administered 2018-04-19: 20 mg via INTRAVENOUS
  Filled 2018-04-19: qty 1

## 2018-04-19 MED ORDER — LEVETIRACETAM 500 MG PO TABS
1000.0000 mg | ORAL_TABLET | Freq: Two times a day (BID) | ORAL | Status: DC
  Administered 2018-04-19 – 2018-04-21 (×5): 1000 mg via ORAL
  Filled 2018-04-19 (×5): qty 2

## 2018-04-19 NOTE — Progress Notes (Signed)
Unable to complete admission database, pt has history of dementia  Telesitter set up at bedside  Son aware of transfer

## 2018-04-19 NOTE — ED Notes (Signed)
Patient's bed alarm went off as patient left the chair. Tele-sitter was not able to redirect patient.

## 2018-04-19 NOTE — ED Notes (Signed)
CBG was 254, informed RN Justin of blood sugar. Patient resting in recliner with tele monitor and bed alarm.

## 2018-04-19 NOTE — ED Triage Notes (Signed)
Pt arrives via [facility] from home; pt states history "for a while" of foul smelling urine and multiple falls past 2 days; per sons pt has dementia with periods of lucidity but presenting increasing confusion/disorientation the past few days  Per [facility] pt stated she "sat down" on the floor and chest felt "tight" but denied SOB or blurred vision  [facility] VS: 220/110 manual BP; CBG 124 IV- 20 R forearm  Pt is oriented x3 (disoriented to time) and alert on arrival

## 2018-04-19 NOTE — ED Notes (Signed)
Patient transported to X-ray 

## 2018-04-19 NOTE — ED Notes (Signed)
Patient up from chair, bed alarm activated. Patient sat on bedside commode and urinated.

## 2018-04-19 NOTE — ED Notes (Signed)
Bed alarm in place.

## 2018-04-19 NOTE — Progress Notes (Addendum)
77 year old female admitted from home early this morning with change in mental status.  Patient has a history of stroke, dementia, diabetes, hypertension.  Patient lives at home with family.  Patient's confusion resolved after blood pressure was treated.  At the time of admission her blood pressure was 220 0109 which was treated with hydralazine 20 mg.  She is very sensitive to hydralazine her blood pressure dropped to  140/70 after 20 mg of hydralazine without any effect on labetalol 20 mg prior to hydralazine.  I suspect patient does have some underlying confusion baseline confusion with history of dementia.  UA was unremarkable in the ER.  CT of the head and chest x-ray was unremarkable.  Will restart hydralazine to lower dose of 5 mg every 6 as needed since her blood pressure is trending back up to 178/100 with increasing confusion again.  I do see the notes from the ER nursing staff that patient was hard to redirect.  Restart home medications which include Norvasc 10 mg daily and Bystolic 5 mg daily.  Patient also takes Keppra 500 mg 2 tablets twice a day and Januvia 100 mg daily.  Potassium low replete and recheck labs today.

## 2018-04-19 NOTE — ED Provider Notes (Signed)
[facility] EMERGENCY DEPARTMENT Provider Note   CSN: [id number] Arrival date & time: 04/19/18  0109     [facility]   [facility] Chief Complaint  Patient presents with  . Fall    HPI Dana Blair is a 77 y.o. female.  Patient presents to the emergency department with a chief complaint of confusion.  She is brought by EMS from home.  Family members report that she has had increasing confusion over the past few days.  They report that she does have a history of dementia at baseline.  Patient reports that she has had foul-smelling urine for the past several days as well as has had multiple falls.  She denies being in any pain now.  History is very limited secondary to dementia/confusion.  Level 5 caveat applies 2/2 confusion/dementia.  The history is provided by the patient. No language interpreter was used.    Past Medical History:  Diagnosis Date  . Abnormality of gait 05/05/2016  . Acidosis   . Acute respiratory failure (HCC)   . Atrial flutter (HCC) 05/05/2016  . Atrioventricular bloc first degree   . Chronic diastolic heart failure (HCC)   . Chronic kidney disease   . Debility 05/05/2016  . Dehydration   . Dementia 05/05/2016  . Diabetes (HCC)    Diabetes mellitus with chronic kidney disease   . Dysphagia 05/05/2016  . Elevated white blood cell count   . Epilepsy with status epilepticus, not intractable (HCC)   . Glaucoma   . Hydronephrosis   . Hyperlipidemia   . Hypertension    [facility]- ECHO 06/29/09 - LVH, normal EF, renal ultrasound-no renal artery stenosis  . Orthostatic hypotension   . Osteopenia   . Stroke [facility](HCC)    Right caudate stroke (6/09) - also had ICH. She had concomitant respiratory failure. On May 22, 2008, CT of the head without contrast demonstrated a 9 x 20 mm acute hematoma within the head of the right caudate with intraventricular extension of hemorrhage. Neurosurgery consult-no ventriculostomy.  . Syncope 07/26/2015  . Unspecified  severe protein-calorie malnutrition [facility](HCC)     Patient Active Problem List   Diagnosis Date Noted  . 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 Seizure (HCC) 12/29/2016  . Debility 05/05/2016  . Dysphagia 05/05/2016  . Abnormality of gait 05/05/2016  . Atrial flutter (HCC) 05/05/2016  . Dementia 05/05/2016  . GERD (gastroesophageal reflux disease) 05/02/2016  . Status epilepticus (HCC) 04/23/2016  . Encounter for central line placement   . Hydronephrosis   . Severe protein-calorie malnutrition (HCC) 07/28/2015  . Syncope and collapse   . Chronic diastolic congestive heart failure (HCC)   . LVH (left ventricular hypertrophy)   . Orthostatic hypotension   . Lactic acidosis   . AKI (acute kidney injury) (HCC) 07/26/2015  . Dehydration 07/26/2015  . Essential hypertension 08/13/2014  . Hyperlipidemia 08/13/2014  . History of stroke 08/13/2014  . DM2 (diabetes mellitus, type 2) (HCC) 08/13/2014  . Mild mitral regurgitation 08/13/2014  . 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 ABDOMINAL HYSTERECTOMY    . FLEXIBLE SIGMOIDOSCOPY N/A 07/30/2015   Procedure: FLEXIBLE SIGMOIDOSCOPY;  Surgeon: Dorena CookeyJohn Hayes, MD;  [facility];  Service: Endoscopy;  Laterality: N/A;  2 fleets enemas at 7:30 AM on July 30     OB History   None      Home Medications    Prior to Admission medications   Medication Sig Start Date  End Date Taking? Authorizing Provider  acetaminophen (TYLENOL) 325 MG tablet Take 2 tablets (650 mg total) by mouth every 6 (six) hours as needed for mild pain, moderate pain, fever or headache (or Fever >/= 101). 12/31/16   Hongalgi, Maximino Greenland, MD  amLODipine (NORVASC) 10 MG tablet TAKE ONE TABLET BY MOUTH ONCE DAILY TO CONTROL BLOOD PRESSURE Please keep upcoming appointment for further refills 03/05/18   [name], MD  aspirin EC 325 MG tablet Take 1 tablet (325 mg total) by mouth daily. 05/01/16   Rai, Ripudeep K, MD  atorvastatin  (LIPITOR) 20 MG tablet TAKE ONE TABLET BY MOUTH ONCE DAILY TO  LOWER  CHOLESTEROL 06/26/16   Kimber Relic, MD  bisacodyl (DULCOLAX) 10 MG suppository Place 1 suppository (10 mg total) rectally daily as needed for moderate constipation. 12/31/16   Hongalgi, Maximino Greenland, MD  ciprofloxacin (CIPRO) 500 MG tablet Take 1 tablet (500 mg total) by mouth 2 (two) times daily. 12/31/16   Hongalgi, Maximino Greenland, MD  levETIRAcetam (KEPPRA) 500 MG tablet TAKE TWO TABLETS BY MOUTH TWICE DAILY 12/02/17   Everlena Cooper, Adam R, DO  memantine (NAMENDA XR) 28 MG CP24 24 hr capsule Take 1 capsule (28 mg total) by mouth daily. 12/11/16   Drema Dallas, DO  metroNIDAZOLE (FLAGYL) 500 MG tablet Take 1 tablet (500 mg total) by mouth 3 (three) times daily. 12/31/16   Hongalgi, Maximino Greenland, MD  nebivolol (BYSTOLIC) 5 MG tablet Take 1 tablet (5 mg total) by mouth daily. Please keep upcoming appointment for further refills 04/04/18   [name], MD  polyethylene glycol (MIRALAX / GLYCOLAX) packet Take 17 g by mouth 2 (two) times daily. 12/31/16   Hongalgi, Maximino Greenland, MD  senna-docusate (SENOKOT-S) 8.6-50 MG tablet Take 2 tablets by mouth at bedtime. 12/31/16   Hongalgi, Maximino Greenland, MD  SITagliptin Phosphate (JANUVIA PO) Take 1 tablet by mouth daily.    [provider]    Family History Family History  Problem Relation Age of Onset  . Diabetes Mother   . Diabetes Father   . Diabetes Sister     Social History Social History   Tobacco Use  . Smoking status: Never Smoker  . Smokeless tobacco: Never Used  Substance Use Topics  . Alcohol use: No  . Drug use: No     Allergies   Patient has no known allergies.   Review of Systems Review of Systems  Unable to perform ROS: Dementia     Physical Exam Updated Vital Signs Resp 17   Ht 5\' 4"  (1.626 m)   LMP  (LMP Unknown)   SpO2 97%   BMI 19.57 kg/m   Physical Exam  Constitutional: She appears well-developed and well-nourished.  HENT:  Head: Normocephalic and atraumatic.    Eyes: Pupils are equal, round, and reactive to light. Conjunctivae and EOM are normal.  Neck: Normal range of motion. Neck supple.  Cardiovascular: Normal rate and regular rhythm. Exam reveals no gallop and no friction rub.  No murmur heard. Pulmonary/Chest: Effort normal and breath sounds normal. No respiratory distress. She has no wheezes. She has no rales. She exhibits no tenderness.  Abdominal: Soft. Bowel sounds are normal. She exhibits no distension and no mass. There is no tenderness. There is no rebound and no guarding.  Musculoskeletal: Normal range of motion. She exhibits no edema or tenderness.  Neurological: She is alert.  Skin: Skin is warm and dry.  Psychiatric: She has a normal mood and affect.  Her behavior is normal. Judgment and thought content normal.  Nursing note and vitals reviewed.    ED Treatments / Results  Labs (all labs ordered are listed, but only abnormal results are displayed) Labs Reviewed  URINALYSIS, ROUTINE W REFLEX MICROSCOPIC - Abnormal; Notable for the following components:      Result Value   Color, Urine STRAW (*)    Glucose, UA 50 (*)    Hgb urine dipstick SMALL (*)    Protein, ur 100 (*)    Bacteria, UA RARE (*)    Squamous Epithelial / LPF 0-5 (*)    All other components within normal limits  CBC WITH DIFFERENTIAL/PLATELET - Abnormal; Notable for the following components:   RBC 5.39 (*)    Hemoglobin 15.4 (*)    All other components within normal limits  BASIC METABOLIC PANEL - Abnormal; Notable for the following components:   Potassium 2.5 (*)    Glucose, Bld 200 (*)    GFR calc non Af Amer 59 (*)    All other components within normal limits  I-STAT TROPONIN, ED    EKG None  Radiology No results found.  Procedures Procedures (including critical care time)  Medications Ordered in ED Medications - No data to display   Initial Impression / Assessment and Plan / ED Course  I have reviewed the triage vital signs and the  nursing notes.  Pertinent labs & imaging results that were available during my care of the patient were reviewed by me and considered in my medical decision making (see chart for details).     Patient with confusion worsening for the past day or 2 per her son, who she lives with.  Noted to be very hypertensive.  CT negative for stroke.  No sign of infection.  Neurovascularly intact.  Concern for hypertensive encephalopathy.  Discussed with Dr. Patria Mane, who agrees with plan for admission.  Discussed with Dr. Julian Reil, who states that pressures need to come down more in order to get her to the floor or step down.  Otherwise she may need ICU and cardine drip.  5:37 AM Pressures incredibly improved with hydralazine.  Appreciate Dr. Julian Reil for admitting the patient.  Final Clinical Impressions(s) / ED Diagnoses   Final diagnoses:  Hypertensive encephalopathy  Hypokalemia    ED Discharge Orders    None       Roxy Horseman, PA-C 04/19/18 1610    Azalia Bilis, MD 04/20/18 [id number]

## 2018-04-19 NOTE — ED Notes (Signed)
Patient has activity apron

## 2018-04-19 NOTE — H&P (Signed)
History and Physical    Dana Blair:[id number] DOB: May 02, 1941 DOA: 04/19/2018  PCP: Deatra James, MD  Patient coming from: Home  I have personally briefly reviewed patient's old medical records in [facility]  Chief Complaint: AMS  HPI: Dana Blair is a 77 y.o. female with medical history significant of stroke, dementia, DM2, HTN.  Family brings patient in with report of increasing confusion over past few days.  Several falls also reported.  Initially in ED patient is reportedly very agitated, pulling out IVs and confused.  BP on presentation is 230/149, multiple successive readings with systolic's in the 230s-240s.   ED Course: Given 20mg  labetalol with out much benefit at all.  BP was still reading 220/109 after that.  Tried 20mg  hydralazine, surprisingly this worked very well (better than hoped for).  BP dropped to ~140/70 and has remained there.  Additionally patients agitation has resolved:  Patient says she had headache when she came in, but is now resolved.  Denies CP or pain anywhere else.  No other specific complaints right now.  Review of Systems: As per HPI otherwise 10 point review of systems negative.   Past Medical History:  Diagnosis Date  . Abnormality of gait 05/05/2016  . Acidosis   . Acute respiratory failure (HCC)   . Atrial flutter (HCC) 05/05/2016  . Atrioventricular bloc first degree   . Chronic diastolic heart failure (HCC)   . Chronic kidney disease   . Debility 05/05/2016  . Dehydration   . Dementia 05/05/2016  . Diabetes (HCC)    Diabetes mellitus with chronic kidney disease   . Dysphagia 05/05/2016  . Elevated white blood cell count   . Epilepsy with status epilepticus, not intractable (HCC)   . Glaucoma   . Hydronephrosis   . Hyperlipidemia   . Hypertension    [facility]- ECHO 06/29/09 - LVH, normal EF, renal ultrasound-no renal artery stenosis  . Orthostatic hypotension   . Osteopenia   . 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 She had concomitant respiratory failure. On May 22, 2008, CT of the head without contrast demonstrated a 9 x 20 mm acute hematoma within the head of the right caudate with intraventricular extension of hemorrhage. Neurosurgery consult-no ventriculostomy.  . Syncope 07/26/2015  . Unspecified severe protein-calorie malnutrition (HCC)     Past Surgical History:  Procedure Laterality Date  . ABDOMINAL HYSTERECTOMY    . FLEXIBLE SIGMOIDOSCOPY N/A 07/30/2015   Procedure: FLEXIBLE SIGMOIDOSCOPY;  Surgeon: Dorena Cookey, MD;  Location: [facility] ENDOSCOPY;  Service: Endoscopy;  Laterality: N/A;  2 fleets enemas at 7:30 AM on July 30     reports that she has never smoked. She has never used smokeless tobacco. She reports that she does not drink alcohol or use drugs.  No Known Allergies  Family History  Problem Relation Age of Onset  . Diabetes Mother   . Diabetes Father   . Diabetes Sister      Prior to Admission medications   Medication Sig Start Date End Date Taking? Authorizing Provider  acetaminophen (TYLENOL) 325 MG tablet Take 2 tablets (650 mg total) by mouth every 6 (six) hours as needed for mild pain, moderate pain, fever or headache (or Fever >/= 101). 12/31/16  Yes Hongalgi, Maximino Greenland, MD  amLODipine (NORVASC) 10 MG tablet TAKE ONE TABLET BY MOUTH ONCE DAILY TO CONTROL BLOOD PRESSURE Please keep upcoming appointment for further refills 03/05/18  Yes [name], MD  aspirin EC 325 MG tablet Take 1 tablet (325 mg total) by mouth daily. 05/01/16  Yes Rai, Ripudeep K, MD  JANUVIA 100 MG tablet Take 100 mg by mouth daily. 04/10/18  Yes [provider]  levETIRAcetam (KEPPRA) 500 MG tablet TAKE TWO TABLETS BY MOUTH TWICE DAILY 12/02/17  Yes Jaffe, Adam R, DO  nebivolol (BYSTOLIC) 5 MG tablet Take 1 tablet (5 mg total) by mouth daily. Please keep upcoming appointment for further refills 04/04/18  Yes Jake BatheSkains, Mark C, MD    Physical Exam: Vitals:   04/19/18 0500 04/19/18 0522  04/19/18 0523 04/19/18 0529  BP: (!) 224/204 138/69 136/70 (!) 148/72  Pulse: 80 75 75   Resp: 19 (!) 24 15   Temp:      TempSrc:      SpO2: 99% 100% 100%   Height:        Constitutional: NAD, calm, comfortable Eyes: PERRL, lids and conjunctivae normal ENMT: Mucous membranes are moist. Posterior pharynx clear of any exudate or lesions.Normal dentition.  Neck: normal, supple, no masses, no thyromegaly Respiratory: clear to auscultation bilaterally, no wheezing, no crackles. Normal respiratory effort. No accessory muscle use.  Cardiovascular: Regular rate and rhythm, no murmurs / rubs / gallops. No extremity edema. 2+ pedal pulses. No carotid bruits.  Abdomen: no tenderness, no masses palpated. No hepatosplenomegaly. Bowel sounds positive.  Musculoskeletal: no clubbing / cyanosis. No joint deformity upper and lower extremities. Good ROM, no contractures. Normal muscle tone.  Skin: no rashes, lesions, ulcers. No induration Neurologic: CN 2-12 grossly intact. Sensation intact, DTR normal. Strength 5/5 in all 4.  Psychiatric: calm, oriented to person, place, not date (thought it was Wed).   Labs on Admission: I have personally reviewed following labs and imaging studies  CBC: Recent Labs  Lab 04/19/18 0319  WBC 7.6  NEUTROABS 5.5  HGB 15.4*  HCT 45.2  MCV 83.9  PLT 245   Basic Metabolic Panel: Recent Labs  Lab 04/19/18 0319  NA 138  K 2.5*  CL 101  CO2 24  GLUCOSE 200*  BUN 18  CREATININE 0.92  CALCIUM 9.2   GFR: CrCl cannot be calculated (Unknown ideal weight.). Liver Function Tests: No results for input(s): AST, ALT, ALKPHOS, BILITOT, PROT, ALBUMIN in the last 168 hours. No results for input(s): LIPASE, AMYLASE in the last 168 hours. No results for input(s): AMMONIA in the last 168 hours. Coagulation Profile: No results for input(s): INR, PROTIME in the last 168 hours. Cardiac Enzymes: No results for input(s): CKTOTAL, CKMB, CKMBINDEX, TROPONINI in the last 168  hours. BNP (last 3 results) No results for input(s): PROBNP in the last 8760 hours. HbA1C: No results for input(s): HGBA1C in the last 72 hours. CBG: No results for input(s): GLUCAP in the last 168 hours. Lipid Profile: No results for input(s): CHOL, HDL, LDLCALC, TRIG, CHOLHDL, LDLDIRECT in the last 72 hours. Thyroid Function Tests: No results for input(s): TSH, T4TOTAL, FREET4, T3FREE, THYROIDAB in the last 72 hours. Anemia Panel: No results for input(s): VITAMINB12, FOLATE, FERRITIN, TIBC, IRON, RETICCTPCT in the last 72 hours. Urine analysis:    Component Value Date/Time   COLORURINE STRAW (A) 04/19/2018 0206   APPEARANCEUR CLEAR 04/19/2018 0206   LABSPEC 1.012 04/19/2018 0206   PHURINE 6.0 04/19/2018 0206   GLUCOSEU 50 (A) 04/19/2018 0206   HGBUR SMALL (A) 04/19/2018 0206   BILIRUBINUR NEGATIVE 04/19/2018 0206   KETONESUR NEGATIVE 04/19/2018 0206   PROTEINUR 100 (A) 04/19/2018 0206   UROBILINOGEN 0.2 07/29/2015 1127  NITRITE NEGATIVE 04/19/2018 0206   LEUKOCYTESUR NEGATIVE 04/19/2018 0206    Radiological Exams on Admission: Dg Chest 2 View  Result Date: 04/19/2018 CLINICAL DATA:  Foul-smelling urine. Multiple falls over the past few days. Dementia. EXAM: CHEST - 2 VIEW COMPARISON:  None. FINDINGS: The heart size and mediastinal contours are within normal limits. Moderate aortic atherosclerosis at the arch without aneurysm. Minimal retrocardiac atelectasis. No pulmonary consolidation or edema. No acute displaced rib fracture. The visualized skeletal structures are unremarkable. IMPRESSION: No active cardiopulmonary disease. Moderate aortic atherosclerosis at the arch. Electronically Signed   By: Tollie Eth M.D.   On: 04/19/2018 02:36   Ct Head Wo Contrast  Result Date: 04/19/2018 CLINICAL DATA:  Acute onset of confusion and disorientation. Foul-smelling urine and multiple recent falls. EXAM: CT HEAD WITHOUT CONTRAST TECHNIQUE: Contiguous axial images were obtained from the  base of the skull through the vertex without intravenous contrast. COMPARISON:  CT of the head and MRI of the brain performed 04/23/2016 FINDINGS: Brain: No evidence of acute infarction, hemorrhage, hydrocephalus, extra-axial collection or mass lesion / mass effect. Prominence of the ventricles and sulci reflects moderate cortical volume loss. Diffuse periventricular and subcortical white matter change likely reflects small vessel ischemic microangiopathy. The brainstem and fourth ventricle are within normal limits. The basal ganglia are unremarkable in appearance. The cerebral hemispheres demonstrate grossly normal gray-white differentiation. No mass effect or midline shift is seen. Vascular: No hyperdense vessel or unexpected calcification. Skull: No free fluid is identified within the pelvis. The colon is unremarkable in appearance; the appendix is normal in caliber, and contains air. Sinuses/Orbits: The orbits are within normal limits. The paranasal sinuses and mastoid air cells are well-aerated. Other: No significant soft tissue abnormalities are seen. IMPRESSION: 1. No acute intracranial pathology seen on CT. 2. Moderate cortical volume loss and diffuse small vessel ischemic microangiopathy. Electronically Signed   By: Roanna Raider M.D.   On: 04/19/2018 02:55    EKG: Independently reviewed.  Assessment/Plan Principal Problem:   Hypertensive encephalopathy Active Problems:   DM2 (diabetes mellitus, type 2) (HCC)    1. HTN encephalopathy - believe it may be improved / resolved at this point, patient now just with mild dementia, no further agitation.  Suspect she may now be at baseline mental status. 1. BP actually much lower than goal due to unexpected sensitivity of hydralazine 2. Thankfully patient doesn't appear to have adverse effects at this point 3. Will hold off on ordering further BP meds since BP now dropped much more than initially intended. 4. Tele monitor 2. DM2 - 1. Sensitive SSI  Q4H  DVT prophylaxis: Lovenox Code Status: Full Family Communication: No family in room Disposition Plan: Home after admit Consults called: None Admission status: Place in obs   Leea Rambeau, Heywood Iles. DO [facility] Pager [phone]  If 7AM-7PM, please contact day team taking care of patient [email or web address] Password [facility]  04/19/2018, 6:16 AM

## 2018-04-20 ENCOUNTER — Encounter (HOSPITAL_COMMUNITY): Payer: Self-pay

## 2018-04-20 DIAGNOSIS — I674 Hypertensive encephalopathy: Secondary | ICD-10-CM | POA: Diagnosis not present

## 2018-04-20 LAB — GLUCOSE, CAPILLARY
GLUCOSE-CAPILLARY: 226 mg/dL — AB (ref 65–99)
GLUCOSE-CAPILLARY: 140 mg/dL — AB (ref 65–99)
Glucose-Capillary: 147 mg/dL — ABNORMAL HIGH (ref 65–99)
Glucose-Capillary: 228 mg/dL — ABNORMAL HIGH (ref 65–99)

## 2018-04-20 LAB — POTASSIUM: Potassium: 3.9 mmol/L (ref 3.5–5.1)

## 2018-04-20 MED ORDER — NEBIVOLOL HCL 5 MG PO TABS
5.0000 mg | ORAL_TABLET | Freq: Two times a day (BID) | ORAL | Status: DC
  Administered 2018-04-20 – 2018-04-21 (×2): 5 mg via ORAL
  Filled 2018-04-20 (×3): qty 1

## 2018-04-20 MED ORDER — HYDRALAZINE HCL 25 MG PO TABS
25.0000 mg | ORAL_TABLET | Freq: Three times a day (TID) | ORAL | Status: DC
  Administered 2018-04-20 – 2018-04-21 (×4): 25 mg via ORAL
  Filled 2018-04-20 (×4): qty 1

## 2018-04-20 NOTE — Progress Notes (Signed)
PROGRESS NOTE    Dana Blair  UJW:[id number] DOB: 11/10/1941 DOA: 04/19/2018 PCP: Deatra JamesSun, Vyvyan, MD   Brief Narrative: 77 y.o. female with medical history significant of stroke, dementia, DM2, HTN.  Family brings patient in with report of increasing confusion over past few days.  Several falls also reported.  Initially in ED patient is reportedly very agitated, pulling out IVs and confused.  BP on presentation is 230/149, multiple successive readings with systolic's in the 230s-240s.   ED Course: Given 20mg  labetalol with out much benefit at all.  BP was still reading 220/109 after that.  Tried 20mg  hydralazine, surprisingly this worked very well (better than hoped for).  BP dropped to ~140/70 and has remained there.  Additionally patients agitation has resolved:  Patient says she had headache when she came in, but is now resolved.  Denies CP or pain anywhere else.  No other specific complaints right now   Assessment & Plan:   Principal Problem:   Hypertensive encephalopathy Active Problems:   DM2 (diabetes mellitus, type 2) (HCC)  1] hypertensive encephalopathy in a patient with a history of underlying dementia-her blood pressure still remains elevated continue Norvasc at 10 mg daily increase Bystolic to 5 mg twice a day and hydralazine 25 mg 3 times a day.  2] type 2 diabetes -takes Januvia at home currently on SSI blood sugar stable.    DVT prophylaxis:lovenox Code Status: full Family Communication:none Disposition Plan:  tbd  Consultants: none  Procedures:none Antimicrobials:none Subjective: Denies headache,nausea or vomiting  Objective:resting in nad Vitals:   04/19/18 2338 04/20/18 0513 04/20/18 0600 04/20/18 0957  BP: (!) 168/86 (!) 178/98 (!) 171/88 (!) 155/80  Pulse: 79 72 70 70  Resp: 18 18    Temp: 99 F (37.2 C) 98.6 F (37 C)    TempSrc: Oral Oral    SpO2: 99% 100%    Weight:  49.9 kg (110 lb)    Height:        Intake/Output  Summary (Last 24 hours) at 04/20/2018 1040 Last data filed at 04/20/2018 0600 Gross per 24 hour  Intake 240 ml  Output -  Net 240 ml   Filed Weights   04/19/18 0600 04/19/18 1633 04/20/18 0513  Weight: 51.7 kg (114 lb) 47.1 kg (103 lb 14.4 oz) 49.9 kg (110 lb)    Examination:  General exam: Appears calm and comfortable  Respiratory system: Clear to auscultation. Respiratory effort normal. Cardiovascular system: S1 & S2 heard, RRR. No JVD, murmurs, rubs, gallops or clicks. No pedal edema. Gastrointestinal system: Abdomen is nondistended, soft and nontender. No organomegaly or masses felt. Normal bowel sounds heard. Central nervous system: Alert and oriented. No focal neurological deficits. Extremities: Symmetric 5 x 5 power. Skin: No rashes, lesions or ulcers Psychiatry: Judgement and insight appear normal. Mood & affect appropriate.     Data Reviewed: I have personally reviewed following labs and imaging studies  CBC: Recent Labs  Lab 04/19/18 0319  WBC 7.6  NEUTROABS 5.5  HGB 15.4*  HCT 45.2  MCV 83.9  PLT 245   Basic Metabolic Panel: Recent Labs  Lab 04/19/18 0319 04/19/18 1250 04/20/18 0723  NA 138 140  --   K 2.5* 2.9* 3.9  CL 101 102  --   CO2 24 26  --   GLUCOSE 200* 177*  --   BUN 18 19  --   CREATININE 0.92 1.11*  --   CALCIUM 9.2 9.3  --    GFR: Estimated Creatinine Clearance:  34 mL/min (A) (by C-G formula based on SCr of 1.11 mg/dL (H)). Liver Function Tests: No results for input(s): AST, ALT, ALKPHOS, BILITOT, PROT, ALBUMIN in the last 168 hours. No results for input(s): LIPASE, AMYLASE in the last 168 hours. No results for input(s): AMMONIA in the last 168 hours. Coagulation Profile: No results for input(s): INR, PROTIME in the last 168 hours. Cardiac Enzymes: No results for input(s): CKTOTAL, CKMB, CKMBINDEX, TROPONINI in the last 168 hours. BNP (last 3 results) No results for input(s): PROBNP in the last 8760 hours. HbA1C: No results for  input(s): HGBA1C in the last 72 hours. CBG: Recent Labs  Lab 04/19/18 0915 04/19/18 1337 04/19/18 1656 04/19/18 2100 04/20/18 0725  GLUCAP 254* 216* 167* 116* 147*   Lipid Profile: No results for input(s): CHOL, HDL, LDLCALC, TRIG, CHOLHDL, LDLDIRECT in the last 72 hours. Thyroid Function Tests: No results for input(s): TSH, T4TOTAL, FREET4, T3FREE, THYROIDAB in the last 72 hours. Anemia Panel: No results for input(s): VITAMINB12, FOLATE, FERRITIN, TIBC, IRON, RETICCTPCT in the last 72 hours. Sepsis Labs: No results for input(s): PROCALCITON, LATICACIDVEN in the last 168 hours.  No results found for this or any previous visit (from the past 240 hour(s)).       Radiology Studies: Dg Chest 2 View  Result Date: 04/19/2018 CLINICAL DATA:  Foul-smelling urine. Multiple falls over the past few days. Dementia. EXAM: CHEST - 2 VIEW COMPARISON:  None. FINDINGS: The heart size and mediastinal contours are within normal limits. Moderate aortic atherosclerosis at the arch without aneurysm. Minimal retrocardiac atelectasis. No pulmonary consolidation or edema. No acute displaced rib fracture. The visualized skeletal structures are unremarkable. IMPRESSION: No active cardiopulmonary disease. Moderate aortic atherosclerosis at the arch. Electronically Signed   By: Tollie Eth M.D.   On: 04/19/2018 02:36   Ct Head Wo Contrast  Result Date: 04/19/2018 CLINICAL DATA:  Acute onset of confusion and disorientation. Foul-smelling urine and multiple recent falls. EXAM: CT HEAD WITHOUT CONTRAST TECHNIQUE: Contiguous axial images were obtained from the base of the skull through the vertex without intravenous contrast. COMPARISON:  CT of the head and MRI of the brain performed 04/23/2016 FINDINGS: Brain: No evidence of acute infarction, hemorrhage, hydrocephalus, extra-axial collection or mass lesion / mass effect. Prominence of the ventricles and sulci reflects moderate cortical volume loss. Diffuse  periventricular and subcortical white matter change likely reflects small vessel ischemic microangiopathy. The brainstem and fourth ventricle are within normal limits. The basal ganglia are unremarkable in appearance. The cerebral hemispheres demonstrate grossly normal gray-white differentiation. No mass effect or midline shift is seen. Vascular: No hyperdense vessel or unexpected calcification. Skull: No free fluid is identified within the pelvis. The colon is unremarkable in appearance; the appendix is normal in caliber, and contains air. Sinuses/Orbits: The orbits are within normal limits. The paranasal sinuses and mastoid air cells are well-aerated. Other: No significant soft tissue abnormalities are seen. IMPRESSION: 1. No acute intracranial pathology seen on CT. 2. Moderate cortical volume loss and diffuse small vessel ischemic microangiopathy. Electronically Signed   By: Roanna Raider M.D.   On: 04/19/2018 02:55        Scheduled Meds: . amLODipine  10 mg Oral Daily  . aspirin EC  325 mg Oral Daily  . enoxaparin (LOVENOX) injection  40 mg Subcutaneous Q24H  . hydrALAZINE  25 mg Oral Q8H  . insulin aspart  0-9 Units Subcutaneous TID WC  . levETIRAcetam  1,000 mg Oral BID  . nebivolol  5 mg  Oral BID   Continuous Infusions:   LOS: 0 days     Alwyn Ren, MD [facility] If 7PM-7AM, please contact night-coverage [email or web address] Password [email or web address] 04/20/2018, 10:40 AM

## 2018-04-20 NOTE — Progress Notes (Signed)
Patient sleeping during shift report.      

## 2018-04-20 NOTE — Progress Notes (Signed)
Pt is resting comfortably, covered in blankets as she wishes.

## 2018-04-20 NOTE — Plan of Care (Signed)
  Problem: Pain Managment: Goal: General experience of comfort will improve Outcome: Completed/Met

## 2018-04-20 NOTE — Progress Notes (Signed)
Pt set-up for dinner, purewick in place; food prepared.

## 2018-04-20 NOTE — Evaluation (Signed)
Physical Therapy Evaluation Patient Details Name: Dana Blair MRN: [id number] DOB: [birth year] Today's Date: 04/20/2018   History of Present Illness  77 y.o. female with medical history significant of stroke, dementia, DM2, HTN.  Family brings patient in with report of increasing confusion over past few days.  Several falls also reported. Hypertensive encephalopathy  Clinical Impression   Pt admitted with above diagnosis. Pt currently with functional limitations due to the deficits listed below (see PT Problem List). Presents with generalized weakness, and currently requires mod assist for sit to stand and transfers; Son reports good improvement since arriving at the hospital; My hope is that as she improves medically, she will improve functionally; I tend to favor dc home to familiar environment, routine, and caregivers for patients with dementia; Will monitor progress and update dc plan as needed;  Pt will benefit from skilled PT to increase their independence and safety with mobility to allow discharge to the venue listed below.       Follow Up Recommendations Home health PT;Other (comment)(as well as HHOT and HHAide)    Equipment Recommendations  Rolling walker with 5" wheels;3in1 (PT);Other (comment)(will need to trial with RW)    Recommendations for Other Services OT consult; ordered per protocol    Precautions / Restrictions Precautions Precautions: Fall      Mobility  Bed Mobility Overal bed mobility: Needs Assistance Bed Mobility: Supine to Sit     Supine to sit: Min assist     General bed mobility comments: Min assist and cues to initiate and follow throught to reciprocally scoot and get feet to floor  Transfers Overall transfer level: Needs assistance Equipment used: 1 person hand held assist Transfers: Sit to/from Stand Sit to Stand: Mod assist         General transfer comment: Light mod assist to power up  Ambulation/Gait Ambulation/Gait assistance:  Min assist Ambulation Distance (Feet): (pivot steps bed to chair) Assistive device: 1 person hand held assist Gait Pattern/deviations: Shuffle     General Gait Details: weak and unsteady; opted to take steps to recliner  Stairs            Wheelchair Mobility    Modified Rankin (Stroke Patients Only)       Balance                                             Pertinent Vitals/Pain Pain Assessment: No/denies pain    Home Living Family/patient expects to be discharged to:: Private residence Living Arrangements: Children Available Help at Discharge: Family;Friend(s);Other (Comment)(home alone a few hours per day) Type of Home: House Home Access: Stairs to enter Entrance Stairs-Rails: None Entrance Stairs-Number of Steps: 1 Home Layout: One level        Prior Function Level of Independence: Needs assistance   Gait / Transfers Assistance Needed: walks without asssistive device  ADL's / Homemaking Assistance Needed: son cooks, cleans; He tells me he worries about her with bathing        Hand Dominance        Extremity/Trunk Assessment   Upper Extremity Assessment Upper Extremity Assessment: Generalized weakness;Defer to OT evaluation(with noted tremor)    Lower Extremity Assessment Lower Extremity Assessment: Generalized weakness       Communication   Communication: Other (comment)(per son, not talking as much as normal)  Cognition Arousal/Alertness: Awake/alert Behavior During Therapy: [facility] for  tasks assessed/performed Overall Cognitive Status: Impaired/Different from baseline                                 General Comments: Not as talkative as usual per son      General Comments      Exercises     Assessment/Plan    PT Assessment Patient needs continued PT services  PT Problem List Decreased strength;Decreased activity tolerance;Decreased balance;Decreased coordination;Decreased mobility;Decreased  cognition;Decreased knowledge of use of DME;Decreased safety awareness       PT Treatment Interventions DME instruction;Gait training;Stair training;Functional mobility training;Therapeutic activities;Therapeutic exercise;Balance training;Cognitive remediation;Patient/family education    PT Goals (Current goals can be found in the Care Plan section)  Acute Rehab PT Goals Patient Stated Goal: did not state PT Goal Formulation: With family Time For Goal Achievement: 05/04/18 Potential to Achieve Goals: Good    Frequency Min 3X/week   Barriers to discharge Decreased caregiver support Home alone a few hours a day    Co-evaluation               AM-PAC PT "6 Clicks" Daily Activity  Outcome Measure Difficulty turning over in bed (including adjusting bedclothes, sheets and blankets)?: A Little Difficulty moving from lying on back to sitting on the side of the bed? : A Little Difficulty sitting down on and standing up from a chair with arms (e.g., wheelchair, bedside commode, etc,.)?: Unable Help needed moving to and from a bed to chair (including a wheelchair)?: A Lot Help needed walking in hospital room?: A Lot Help needed climbing 3-5 steps with a railing? : A Lot 6 Click Score: 13    End of Session Equipment Utilized During Treatment: Gait belt Activity Tolerance: Patient tolerated treatment well Patient left: in chair;with call bell/phone within reach;with chair alarm set Nurse Communication: Mobility status PT Visit Diagnosis: Unsteadiness on feet (R26.81);Muscle weakness (generalized) (M62.81)    Time: [id number] PT Time Calculation (min) (ACUTE ONLY): 35 min   Charges:   PT Evaluation $PT Eval Moderate Complexity: 1 Mod     PT G Codes:        Van ClinesHolly Jedadiah Abdallah, PT  Acute Rehabilitation Services Pager [phone] Office [phone]   Levi AlandHolly H Sophiagrace Benbrook 04/20/2018, 3:46 PM

## 2018-04-20 NOTE — Progress Notes (Signed)
Pt has son at bedside, who has her set up for lunch.

## 2018-04-20 NOTE — Progress Notes (Signed)
Admission database completed with son at bedside

## 2018-04-20 NOTE — Progress Notes (Signed)
Pt sat up for breakfast, food cut for pt to eat easier.   Pt consumed 30-40% of meal independently prior to receiving am insulin.

## 2018-04-21 DIAGNOSIS — I674 Hypertensive encephalopathy: Secondary | ICD-10-CM | POA: Diagnosis not present

## 2018-04-21 LAB — GLUCOSE, CAPILLARY
GLUCOSE-CAPILLARY: 174 mg/dL — AB (ref 65–99)
GLUCOSE-CAPILLARY: 201 mg/dL — AB (ref 65–99)

## 2018-04-21 MED ORDER — NEBIVOLOL HCL 5 MG PO TABS: 5.0000 mg | ORAL_TABLET | Freq: Two times a day (BID) | ORAL | 0 refills | Status: DC

## 2018-04-21 MED ORDER — HYDRALAZINE HCL 25 MG PO TABS: 25.0000 mg | ORAL_TABLET | Freq: Three times a day (TID) | ORAL | 0 refills | Status: AC

## 2018-04-21 NOTE — Evaluation (Signed)
Occupational Therapy Evaluation Patient Details Name: Dana Blair MRN: [id number] DOB: [birth year] Today's Date: 04/21/2018    History of Present Illness 77 y.o. female with medical history significant of stroke, dementia, DM2, HTN.  Family brings patient in with report of increasing confusion over past few days.  Several falls also reported. Hypertensive encephalopathy   Clinical Impression   PTA, pt reports living with her son and being independent for basic ADL and functional mobility. Per chart, son is concerned about her ability to safely bathe herself. Son additionally provides assistance for home management and IADL tasks. Pt currently requiring mod assist for LB ADL and min assist for toilet transfers with handheld assistance. Pt additionally requiring assistance for thoroughness with toileting hygiene following bowel movement. Pt with confusion and requires cues to continue participation in ADL tasks but was able to state her birth date and that she is in the hospital. Pt does have a history of cognitive deficits at baseline. Pt would benefit from continued OT services while admitted to improve independence and safety with ADL and functional mobility. Feel pt would best recover in her home environment if able to have 24 hour hands on assistance with home health OT follow-up. May need to consider post-acute rehabilitation if pt unable to have 24 hour assistance.     Follow Up Recommendations  Home health OT;Supervision/Assistance - 24 hour(Pt will need 24 hour assistance at home)    Equipment Recommendations  3 in 1 bedside commode;Tub/shower seat    Recommendations for Other Services       Precautions / Restrictions Precautions Precautions: Fall Restrictions Weight Bearing Restrictions: No      Mobility Bed Mobility Overal bed mobility: Needs Assistance Bed Mobility: Supine to Sit     Supine to sit: Supervision     General bed mobility comments: Increased time and  verbal cues to scoot all the way to EOB  Transfers Overall transfer level: Needs assistance Equipment used: 1 person hand held assist Transfers: Sit to/from Stand Sit to Stand: Min assist         General transfer comment: Min assist to power up to standing with handheld assistance for stability. Able to take a few steps with assistance.     Balance Overall balance assessment: Needs assistance Sitting-balance support: Feet supported;No upper extremity supported Sitting balance-Leahy Scale: Fair     Standing balance support: Single extremity supported;Bilateral upper extremity supported Standing balance-Leahy Scale: Fair Standing balance comment: Requiring UE support to maintain upright positioning.                            ADL either performed or assessed with clinical judgement   ADL Overall ADL's : Needs assistance/impaired Eating/Feeding: Sitting;Set up   Grooming: Sitting;Supervision/safety Grooming Details (indicate cue type and reason): cues to continue with tasks Upper Body Bathing: Minimal assistance;Sitting   Lower Body Bathing: Moderate assistance;Sit to/from stand   Upper Body Dressing : Minimal assistance;Sitting   Lower Body Dressing: Moderate assistance;Sit to/from stand   Toilet Transfer: Minimal assistance Toilet Transfer Details (indicate cue type and reason): Taking a few steps with handheld assistance and reaching for arm rest of BSC.  Toileting- Clothing Manipulation and Hygiene: Minimal assistance;Sit to/from stand Toileting - Clothing Manipulation Details (indicate cue type and reason): Able to complete task without assistance but requiring assistance for thoroughness.      Functional mobility during ADLs: Minimal assistance General ADL Comments: Min assist for stability during ambulation.  Pt tends to reach for furniture for stability but would benefit from RW.      Vision Patient Visual Report: No change from baseline Vision  Assessment?: No apparent visual deficits     Perception     Praxis      Pertinent Vitals/Pain Pain Assessment: No/denies pain     Hand Dominance     Extremity/Trunk Assessment Upper Extremity Assessment Upper Extremity Assessment: Generalized weakness(bilateral tremor)   Lower Extremity Assessment Lower Extremity Assessment: Generalized weakness       Communication Communication Communication: No difficulties(however, requires prompts to continue with conversation)   Cognition Arousal/Alertness: Awake/alert Behavior During Therapy: WFL for tasks assessed/performed Overall Cognitive Status: Impaired/Different from baseline                                 General Comments: Pt with history of dementia at baseline. Able to report her birthday and that she is in the hospital but not able to report which one. Fleeting short-term memory noted as pt requiring cues each time she was handed a washcloth to continue with toileting hygiene.    General Comments       Exercises     Shoulder Instructions      Home Living Family/patient expects to be discharged to:: Private residence Living Arrangements: Children Available Help at Discharge: Family;Friend(s);Other (Comment)(home alone a few hours per day) Type of Home: House Home Access: Stairs to enter Entergy CorporationEntrance Stairs-Number of Steps: 1 Entrance Stairs-Rails: None Home Layout: One level     Bathroom Shower/Tub: Walk-in shower             Additional Comments: Pt reports that she does not use a RW or cane. No family present and unsure of equipment at home due to pt with difficulty providing history.       Prior Functioning/Environment Level of Independence: Needs assistance  Gait / Transfers Assistance Needed: walks without asssistive device ADL's / Homemaking Assistance Needed: Per PT son cooks and cleans. PT note also reports that son is concerned with bathing. Son not present during session. Pt reports  that she is independent with basic ADL but unsure of reliability of report.  Communication / Swallowing Assistance Needed: No difficulties          OT Problem List: Decreased strength;Decreased range of motion;Decreased activity tolerance;Impaired balance (sitting and/or standing);Decreased cognition;Decreased safety awareness;Decreased knowledge of precautions;Decreased knowledge of use of DME or AE;Pain      OT Treatment/Interventions: Self-care/ADL training;Therapeutic exercise;DME and/or AE instruction;Patient/family education;Cognitive remediation/compensation;Balance training;Therapeutic activities;Visual/perceptual remediation/compensation    OT Goals(Current goals can be found in the care plan section) Acute Rehab OT Goals Patient Stated Goal: did not state OT Goal Formulation: Patient unable to participate in goal setting Time For Goal Achievement: 05/05/18 Potential to Achieve Goals: Good  OT Frequency: Min 2X/week   Barriers to D/C:            Co-evaluation              AM-PAC PT "6 Clicks" Daily Activity     Outcome Measure Help from another person eating meals?: A Little Help from another person taking care of personal grooming?: A Little Help from another person toileting, which includes using toliet, bedpan, or urinal?: A Little Help from another person bathing (including washing, rinsing, drying)?: A Lot Help from another person to put on and taking off regular upper body clothing?: A Little Help from another person  to put on and taking off regular lower body clothing?: A Lot 6 Click Score: 16   End of Session Equipment Utilized During Treatment: Gait belt Nurse Communication: Mobility status(Nurse tech - pt needs new protective sacral dressing and pure whick)  Activity Tolerance: Patient tolerated treatment well Patient left: in chair;with chair alarm set  OT Visit Diagnosis: Other abnormalities of gait and mobility (R26.89);Muscle weakness  (generalized) (M62.81)                Time: 1610-9604 OT Time Calculation (min): 40 min Charges:  OT General Charges $OT Visit: 1 Visit OT Evaluation $OT Eval Moderate Complexity: 1 Mod OT Treatments $Self Care/Home Management : 23-37 mins G-Codes:     Doristine Section, MS OTR/L  Pager: [phone]   Sheron Robin A Ladaisha Portillo 04/21/2018, 9:42 AM

## 2018-04-21 NOTE — Progress Notes (Addendum)
Unsuccessful call attempt to son "Dana Blair" [phone], message states voicemail is full. NCM also sent text requested return call please.  Spoke with staff RN advised attempted to contact son and unsuccessful and if she speaks with him verify mode of transportation, because NCM does not want to request 3 in 1 be delivered to patient room, if patient going via [facility] can't take the 3-in-1 will need to be delivered.  She verbalized understanding.  In to speak with patient, very sleepy.  Return call received from Dana GourdSon Dana Blair, states he will be picking up his mother at discharge.  Discussed DME recommendation for 3-in-1/rolling walker, offered choice selected [facility].  [facility]; arranged for delivery to patient room prior to discharge.  [facility], offered choice.  [facility] Referral for [facility] health PT/OT/aide [facility] liaison Jesusita OkaDan, accepted.

## 2018-04-21 NOTE — Discharge Summary (Signed)
Physician Discharge Summary  Dana Blair ZOX:[id number] DOB: Jan 30, 1941 DOA: 04/19/2018  PCP: Deatra James, MD  Admit date: 04/19/2018 Discharge date: 04/21/2018  Admitted From:home  Disposition: home 1. Follow up with PCP in 1-2 weeks 2. Please obtain BMP/CBC in one week  Home Health:yes Equipment/Devices:none Discharge Conditionstable CODE STATUSfull Diet recommendation:cardiac  Brief/Interim Summary:76 y.o.femalewith medical history significant ofstroke, dementia, DM2, HTN. Family brings patient in with report of increasing confusion over past few days. Several falls also reported.  Initially in ED patient is reportedly very agitated, pulling out IVs and confused.  BP on presentation is 230/149, multiple successive readings with systolic's in the 230s-240s.   ED Course:Given 20mg  labetalol with out much benefit at all. BP was still reading 220/109 after that. Tried 20mg  hydralazine, surprisingly this worked very well (better than hoped for). BP dropped to ~140/70 and has remained there. Additionally patients agitation has resolved:  Patient says she had headache when she came in, but is now resolved.  Denies CP or pain anywhere else.  No other specific complaints    Discharge Diagnoses:  Principal Problem:   Hypertensive encephalopathy Active Problems:   DM2 (diabetes mellitus, type 2) (HCC)  1] hypertensive encephalopathy patient was admitted with change in mental status elevated blood pressure and was treated with IV hydralazine and labetalol.  Her home dose of Norvasc 10 mg was continued and Bystolic was increased to 5 mg twice a day hydralazine was added to his regime to control her blood pressure.  This was discussed with the son in detail and to continue this new regimen at home.  2] type 2 diabetes restart Januvia.  3] dementia with no behaviors at this time.  Discharge Instructions  Discharge Instructions    DME Bedside commode   Complete  by:  As directed    Patient needs a bedside commode to treat with the following condition:  Gait abnormality   Diet - low sodium heart healthy   Complete by:  As directed    Increase activity slowly   Complete by:  As directed      Allergies as of 04/21/2018   No Known Allergies     Medication List    STOP taking these medications   acetaminophen 325 MG tablet Commonly known as:  TYLENOL     TAKE these medications   amLODipine 10 MG tablet Commonly known as:  NORVASC TAKE ONE TABLET BY MOUTH ONCE DAILY TO CONTROL BLOOD PRESSURE Please keep upcoming appointment for further refills   aspirin EC 325 MG tablet Take 1 tablet (325 mg total) by mouth daily.   hydrALAZINE 25 MG tablet Commonly known as:  APRESOLINE Take 1 tablet (25 mg total) by mouth every 8 (eight) hours.   JANUVIA 100 MG tablet Generic drug:  sitaGLIPtin Take 100 mg by mouth daily.   levETIRAcetam 500 MG tablet Commonly known as:  KEPPRA TAKE TWO TABLETS BY MOUTH TWICE DAILY   nebivolol 5 MG tablet Commonly known as:  BYSTOLIC Take 1 tablet (5 mg total) by mouth 2 (two) times daily. What changed:    when to take this  additional instructions            Durable Medical Equipment  (From admission, onward)        Start     Ordered   04/21/18 0000  DME Bedside commode    Question:  Patient needs a bedside commode to treat with the following condition  Answer:  Gait abnormality   04/21/18  1131     Follow-up Information    Deatra James, MD Follow up.   Specialty:  Family Medicine Contact information: [phone] [address] [place] [address] [phone]          No Known Allergies  Consultations: none Procedures/Studies: Dg Chest 2 View  Result Date: 04/19/2018 CLINICAL DATA:  Foul-smelling urine. Multiple falls over the past few days. Dementia. EXAM: CHEST - 2 VIEW COMPARISON:  None. FINDINGS: The heart size and mediastinal contours are within normal limits. Moderate  aortic atherosclerosis at the arch without aneurysm. Minimal retrocardiac atelectasis. No pulmonary consolidation or edema. No acute displaced rib fracture. The visualized skeletal structures are unremarkable. IMPRESSION: No active cardiopulmonary disease. Moderate aortic atherosclerosis at the arch. Electronically Signed   By: Tollie Eth M.D.   On: 04/19/2018 02:36   Ct Head Wo Contrast  Result Date: 04/19/2018 CLINICAL DATA:  Acute onset of confusion and disorientation. Foul-smelling urine and multiple recent falls. EXAM: CT HEAD WITHOUT CONTRAST TECHNIQUE: Contiguous axial images were obtained from the base of the skull through the vertex without intravenous contrast. COMPARISON:  CT of the head and MRI of the brain performed 04/23/2016 FINDINGS: Brain: No evidence of acute infarction, hemorrhage, hydrocephalus, extra-axial collection or mass lesion / mass effect. Prominence of the ventricles and sulci reflects moderate cortical volume loss. Diffuse periventricular and subcortical white matter change likely reflects small vessel ischemic microangiopathy. The brainstem and fourth ventricle are within normal limits. The basal ganglia are unremarkable in appearance. The cerebral hemispheres demonstrate grossly normal gray-white differentiation. No mass effect or midline shift is seen. Vascular: No hyperdense vessel or unexpected calcification. Skull: No free fluid is identified within the pelvis. The colon is unremarkable in appearance; the appendix is normal in caliber, and contains air. Sinuses/Orbits: The orbits are within normal limits. The paranasal sinuses and mastoid air cells are well-aerated. Other: No significant soft tissue abnormalities are seen. IMPRESSION: 1. No acute intracranial pathology seen on CT. 2. Moderate cortical volume loss and diffuse small vessel ischemic microangiopathy. Electronically Signed   By: Roanna Raider M.D.   On: 04/19/2018 02:55    (Echo, Carotid, EGD, Colonoscopy,  ERCP)    Subjective:   Discharge Exam: Vitals:   04/21/18 0639 04/21/18 0759  BP: (!) 152/81 (!) 167/85  Pulse: 65 62  Resp: 18   Temp: 98.3 F (36.8 C)   SpO2: 99%    Vitals:   04/20/18 2009 04/20/18 2347 04/21/18 0639 04/21/18 0759  BP: 133/71 (!) 175/77 (!) 152/81 (!) 167/85  Pulse: 71 70 65 62  Resp: 18  18   Temp: 98.3 F (36.8 C)  98.3 F (36.8 C)   TempSrc: Oral  Oral   SpO2: 99%  99%   Weight:   48.4 kg (106 lb 11.2 oz)   Height:        General: Pt is alert, awake, not in acute distress Cardiovascular: RRR, S1/S2 +, no rubs, no gallops Respiratory: CTA bilaterally, no wheezing, no rhonchi Abdominal: Soft, NT, ND, bowel sounds + Extremities: no edema, no cyanosis    The results of significant diagnostics from this hospitalization (including imaging, microbiology, ancillary and laboratory) are listed below for reference.     Microbiology: No results found for this or any previous visit (from the past 240 hour(s)).   Labs: BNP (last 3 results) No results for input(s): BNP in the last 8760 hours. Basic Metabolic Panel: Recent Labs  Lab 04/19/18 0319 04/19/18 1250 04/20/18 8295  NA 138 140  --   K 2.5* 2.9* 3.9  CL 101 102  --   CO2 24 26  --   GLUCOSE 200* 177*  --   BUN 18 19  --   CREATININE 0.92 1.11*  --   CALCIUM 9.2 9.3  --    Liver Function Tests: No results for input(s): AST, ALT, ALKPHOS, BILITOT, PROT, ALBUMIN in the last 168 hours. No results for input(s): LIPASE, AMYLASE in the last 168 hours. No results for input(s): AMMONIA in the last 168 hours. CBC: Recent Labs  Lab 04/19/18 0319  WBC 7.6  NEUTROABS 5.5  HGB 15.4*  HCT 45.2  MCV 83.9  PLT 245   Cardiac Enzymes: No results for input(s): CKTOTAL, CKMB, CKMBINDEX, TROPONINI in the last 168 hours. BNP: Invalid input(s): POCBNP CBG: Recent Labs  Lab 04/20/18 0725 04/20/18 1208 04/20/18 1652 04/20/18 2245 04/21/18 0814  GLUCAP 147* 228* 226* 140* 174*    D-Dimer No results for input(s): DDIMER in the last 72 hours. Hgb A1c No results for input(s): HGBA1C in the last 72 hours. Lipid Profile No results for input(s): CHOL, HDL, LDLCALC, TRIG, CHOLHDL, LDLDIRECT in the last 72 hours. Thyroid function studies No results for input(s): TSH, T4TOTAL, T3FREE, THYROIDAB in the last 72 hours.  Invalid input(s): FREET3 Anemia work up No results for input(s): VITAMINB12, FOLATE, FERRITIN, TIBC, IRON, RETICCTPCT in the last 72 hours. Urinalysis    Component Value Date/Time   COLORURINE STRAW (A) 04/19/2018 0206   APPEARANCEUR CLEAR 04/19/2018 0206   LABSPEC 1.012 04/19/2018 0206   PHURINE 6.0 04/19/2018 0206   GLUCOSEU 50 (A) 04/19/2018 0206   HGBUR SMALL (A) 04/19/2018 0206   BILIRUBINUR NEGATIVE 04/19/2018 0206   KETONESUR NEGATIVE 04/19/2018 0206   PROTEINUR 100 (A) 04/19/2018 0206   UROBILINOGEN 0.2 07/29/2015 1127   NITRITE NEGATIVE 04/19/2018 0206   LEUKOCYTESUR NEGATIVE 04/19/2018 0206   Sepsis Labs Invalid input(s): PROCALCITONIN,  WBC,  LACTICIDVEN Microbiology No results found for this or any previous visit (from the past 240 hour(s)).   Time coordinating discharge: Over 30 minutes  SIGNED:   Alwyn RenElizabeth G Lorence Nagengast, MD  [facility] 04/21/2018, 11:33 AM Pager   If 7PM-7AM, please contact night-coverage [email or web address] Password [email or web address]

## 2018-04-21 NOTE — Progress Notes (Signed)
   04/21/18 1100  Clinical Encounter Type  Visited With Patient  Visit Type Initial  Referral From Nurse  Consult/Referral To Chaplain  Spiritual Encounters  Spiritual Needs Emotional  Stress Factors  Patient Stress Factors Exhausted  Family Stress Factors None identified    Pt was very sleepy in her chair when I arrived. Pt requested to be left alone to sleep. Chaplain will revisit if pt is ready and tells the direct care nurse about next chaplain's visit.

## 2018-04-21 NOTE — Progress Notes (Signed)
Physical Therapy Treatment Patient Details Name: Dana Blair MRN: [id number] DOB: 14-Mar-1941 Today's Date: 04/21/2018    History of Present Illness 77 y.o. female with medical history significant of stroke, dementia, DM2, HTN.  Family brings patient in with report of increasing confusion over past few days.  Several falls also reported. Hypertensive encephalopathy    PT Comments    Continuing work on functional mobility and activity tolerance;  Walked the hallway with RW, required min assist for RW management, which is typical for first time with RW; I'm curious how she does walking/gettign around in her familiar environment of home; Glad for HHPT/OT/Aide referrals (thanks, Case Mgr!); recommend working to arrange 24 hour assistance in the home   Follow Up Recommendations  Home health PT;Other (comment)(as well as HHOT and HHAide; Rec 24 hour assistance)     Equipment Recommendations  Rolling walker with 5" wheels;3in1 (PT)    Recommendations for Other Services       Precautions / Restrictions Precautions Precautions: Fall Restrictions Weight Bearing Restrictions: No    Mobility  Bed Mobility Overal bed mobility: Needs Assistance Bed Mobility: Supine to Sit     Supine to sit: Supervision     General bed mobility comments: Increased time and verbal cues to scoot all the way to EOB  Transfers Overall transfer level: Needs assistance Equipment used: Rolling walker (2 wheeled) Transfers: Sit to/from Stand Sit to Stand: Min assist         General transfer comment: Min assist to steady; cues for hand placement  Ambulation/Gait Ambulation/Gait assistance: Min assist Ambulation Distance (Feet): 100 Feet Assistive device: Rolling walker (2 wheeled) Gait Pattern/deviations: Step-through pattern;Decreased step length - right;Decreased step length - left;Trunk flexed     General Gait Details: Min assist mostly for RW control as she tends to have the RW too far in  front; tends to veer Right, requiring correction   Stairs             Wheelchair Mobility    Modified Rankin (Stroke Patients Only)       Balance Overall balance assessment: Needs assistance Sitting-balance support: Feet supported;No upper extremity supported Sitting balance-Leahy Scale: Fair     Standing balance support: Single extremity supported;Bilateral upper extremity supported Standing balance-Leahy Scale: Fair Standing balance comment: Requiring UE support to maintain upright positioning.                             Cognition Arousal/Alertness: Awake/alert Behavior During Therapy: WFL for tasks assessed/performed Overall Cognitive Status: Impaired/Different from baseline                                 General Comments: Pt with history of dementia at baseline. Able to report her birthday and that she is in the hospital but not able to report which one.       Exercises      General Comments        Pertinent Vitals/Pain Pain Assessment: No/denies pain    Home Living Family/patient expects to be discharged to:: Private residence Living Arrangements: Children Available Help at Discharge: Family;Friend(s);Other (Comment)(home alone a few hours per day) Type of Home: House Home Access: Stairs to enter Entrance Stairs-Rails: None Home Layout: One level   Additional Comments: Pt reports that she does not use a RW or cane. No family present and unsure of equipment at home due  to pt with difficulty providing history.     Prior Function Level of Independence: Needs assistance  Gait / Transfers Assistance Needed: walks without asssistive device ADL's / Homemaking Assistance Needed: Per PT son cooks and cleans. PT note also reports that son is concerned with bathing. Son not present during session. Pt reports that she is independent with basic ADL but unsure of reliability of report.      PT Goals (current goals can now be found in  the care plan section) Acute Rehab PT Goals Patient Stated Goal: did not state PT Goal Formulation: With family Time For Goal Achievement: 05/04/18 Potential to Achieve Goals: Good Progress towards PT goals: Progressing toward goals    Frequency    Min 3X/week      PT Plan Current plan remains appropriate    Co-evaluation              AM-PAC PT "6 Clicks" Daily Activity  Outcome Measure  Difficulty turning over in bed (including adjusting bedclothes, sheets and blankets)?: A Little Difficulty moving from lying on back to sitting on the side of the bed? : A Little Difficulty sitting down on and standing up from a chair with arms (e.g., wheelchair, bedside commode, etc,.)?: A Lot Help needed moving to and from a bed to chair (including a wheelchair)?: A Little Help needed walking in hospital room?: A Little Help needed climbing 3-5 steps with a railing? : A Lot 6 Click Score: 16    End of Session Equipment Utilized During Treatment: Gait belt Activity Tolerance: Patient tolerated treatment well Patient left: in chair;with call bell/phone within reach;with chair alarm set Nurse Communication: Mobility status PT Visit Diagnosis: Unsteadiness on feet (R26.81);Muscle weakness (generalized) (M62.81)     Time: 1001-1015 PT Time Calculation (min) (ACUTE ONLY): 14 min  Charges:  $Gait Training: 8-22 mins                    G Codes:       Dana ClinesHolly Dana Blair, [place]PT  Acute Rehabilitation Services Pager [phone] Office [phone]    Dana AlandHolly H [name] 04/21/2018, 12:34 PM

## 2018-04-30 ENCOUNTER — Encounter: Payer: Self-pay | Admitting: Nurse Practitioner

## 2018-04-30 ENCOUNTER — Ambulatory Visit: Payer: Medicare Other | Admitting: Nurse Practitioner

## 2018-04-30 VITALS — BP 150/80 | HR 68 | Ht 64.0 in | Wt 115.0 lb

## 2018-04-30 DIAGNOSIS — I10 Essential (primary) hypertension: Secondary | ICD-10-CM | POA: Diagnosis not present

## 2018-04-30 DIAGNOSIS — E7849 Other hyperlipidemia: Secondary | ICD-10-CM

## 2018-04-30 MED ORDER — ASPIRIN EC 81 MG PO TBEC: 81.0000 mg | DELAYED_RELEASE_TABLET | Freq: Every day | ORAL | 3 refills | Status: AC

## 2018-04-30 MED ORDER — AMLODIPINE BESYLATE 10 MG PO TABS: ORAL_TABLET | ORAL | 3 refills | Status: AC

## 2018-04-30 NOTE — Progress Notes (Signed)
CARDIOLOGY OFFICE NOTE  Date:  04/30/2018     Scharlene Gloss Date of Birth: Sep 01, 1941 Medical Record #[id number]  PCP:  Deatra James, MD  Cardiologist:  Rick Duff    Chief Complaint  Patient presents with  . Hypertension    Follow up visit - seen for Dr. Anne Fu    History of Present Illness: Dana Blair is a 77 y.o. female who presents today for a follow up visit. Seen for Dr. Anne Fu.   She has a history of difficult to control hypertension, diabetes, prior CVA, & hyperlipidemia. She has had a remote spell of syncope -  echo was normal, acute kidney injury resolved likely from dehydration and urinary tract infection resolved as well.   Last seen in January of 2018. Felt to be doing ok.   Admitted back in April - had increasing confusion, falls. BP sky high. Given Labetalol and then Hydralazine with good results - agitation resolved. Her home dose of Bystolic was increased.   Comes in today. Here with her son. He notes that he probably got too busy with working 2 jobs - she did not have that much "supervision". Feels like this was the etiology for her recent demise. Now ok. Getting some PT. Unclear why she is not on statin - I do not see any recent lipids. She remains on high dose aspirin - her stroke was back in 2017. No chest pain. Breathing is ok. Needing Norvasc refilled today.   Past Medical History:  Diagnosis Date  . Abnormality of gait 05/05/2016  . Acidosis   . Acute respiratory failure (HCC)   . Atrial flutter (HCC) 05/05/2016  . Atrioventricular bloc first degree   . Chronic diastolic heart failure (HCC)   . Chronic kidney disease   . Debility 05/05/2016  . Dehydration   . Dementia 05/05/2016  . Diabetes (HCC)    Diabetes mellitus with chronic kidney disease   . Dysphagia 05/05/2016  . Elevated white blood cell count   . Epilepsy with status epilepticus, not intractable (HCC)   . Glaucoma   . Hydronephrosis   . Hyperlipidemia   . Hypertension    [facility]- [facility]CHO 06/29/09 - LVH, normal EF, renal ultrasound-no renal artery stenosis  . Orthostatic hypotension   . Osteopenia   . 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 She had concomitant respiratory failure. On May 22, 2008, CT of the head without contrast demonstrated a 9 x 20 mm acute hematoma within the head of the right caudate with intraventricular extension of hemorrhage. Neurosurgery consult-no ventriculostomy.  . Syncope 07/26/2015  . Unspecified severe protein-calorie malnutrition (HCC)     Past Surgical History:  Procedure Laterality Date  . ABDOMINAL HYSTERECTOMY    . FLEXIBLE SIGMOIDOSCOPY N/A 07/30/2015   Procedure: FLEXIBLE SIGMOIDOSCOPY;  Surgeon: Dorena Cookey, MD;  Location: [facility] ENDOSCOPY;  Service: Endoscopy;  Laterality: N/A;  2 fleets enemas at 7:30 AM on July 30     Medications: Current Meds  Medication Sig  . amLODipine (NORVASC) 10 MG tablet TAKE ONE TABLET BY MOUTH ONCE DAILY TO CONTROL BLOOD PRESSURE  . hydrALAZINE (APRESOLINE) 25 MG tablet Take 1 tablet (25 mg total) by mouth every 8 (eight) hours.  Marland Kitchen JANUVIA 100 MG tablet Take 100 mg by mouth daily.  Marland Kitchen levETIRAcetam (KEPPRA) 500 MG tablet TAKE TWO TABLETS BY MOUTH TWICE DAILY  . nebivolol (BYSTOLIC) 5 MG tablet Take 1 tablet (5 mg total) by mouth  2 (two) times daily.  . [DISCONTINUED] amLODipine (NORVASC) 10 MG tablet TAKE ONE TABLET BY MOUTH ONCE DAILY TO CONTROL BLOOD PRESSURE Please keep upcoming appointment for further refills  . [DISCONTINUED] aspirin EC 325 MG tablet Take 1 tablet (325 mg total) by mouth daily.     Allergies: No Known Allergies  Social History: The patient  reports that she has never smoked. She has never used smokeless tobacco. She reports that she does not drink alcohol or use drugs.   Family History: The patient's family history includes Diabetes in her father, mother, and sister.   Review of Systems: Please see the history of present illness.   Otherwise, the  review of systems is positive for none.   All other systems are reviewed and negative.   Physical Exam: VS:  BP (!) 150/80 (BP Location: Left Arm, Patient Position: Sitting, Cuff Size: Normal)   Pulse 68   Ht  (1.626 m)   Wt 115 lb (52.2 kg)   LMP  (LMP Unknown)   BMI 19.74 kg/m  .  BMI Body mass index is 19.74 kg/m.  Wt Readings from Last 3 Encounters:  04/30/18 115 lb (52.2 kg)  04/21/18 106 lb 11.2 oz (48.4 kg)  01/10/17 114 lb (51.7 kg)   BP is 140/70 by me.  General: Elderly female. Alert and in no acute distress.   HEENT: Normal.  Neck: Supple, no JVD, carotid bruits, or masses noted.  Cardiac: Regular rate and rhythm. Soft systolic murmur noted. No edema.  Respiratory:  Lungs are clear to auscultation bilaterally with normal work of breathing.  GI: Soft and nontender.  MS: No deformity or atrophy. Gait and ROM intact. Using a walker.  Skin: Warm and dry. Color is normal.  Neuro:  Strength and sensation are intact and no gross focal deficits noted.  Psych: Alert, appropriate and with normal affect.   LABORATORY DATA:  EKG:  EKG is ordered today. This demonstrates NSR with poor R wave progression.  Lab Results  Component Value Date   WBC 7.6 04/19/2018   HGB 15.4 (H) 04/19/2018   HCT 45.2 04/19/2018   PLT 245 04/19/2018   GLUCOSE 177 (H) 04/19/2018   CHOL 194 04/25/2016   TRIG 87 04/25/2016   HDL 67 04/25/2016   LDLCALC 110 (H) 04/25/2016   ALT 11 (L) 12/30/2016   AST 21 12/30/2016   NA 140 04/19/2018   K 3.9 04/20/2018   CL 102 04/19/2018   CREATININE 1.11 (H) 04/19/2018   BUN 19 04/19/2018   CO2 26 04/19/2018   INR 1.01 12/30/2016   HGBA1C 6.5 (H) 12/29/2016     BNP (last 3 results) No results for input(s): BNP in the last 8760 hours.  ProBNP (last 3 results) No results for input(s): PROBNP in the last 8760 hours.   Other Studies Reviewed Today:  ECHO: 05/01/16: - Left ventricle: The cavity size was normal. There was mild concentric  hypertrophy. Systolic function was normal. The estimated ejection fraction was in the range of 60% to 65%. There was dynamic obstruction in the mid cavity, with a peak velocity of 246.1 cm/sec and a peak gradient of 24 mm Hg. Wall motion was normal; there were no regional wall motion abnormalities. Doppler parameters are consistent with abnormal left ventricular relaxation (grade 1 diastolic dysfunction). - Aortic valve: Transvalvular velocity was within the normal range. There was no stenosis. There was no regurgitation. - Mitral valve: Transvalvular velocity was within the normal range. There was no  evidence for stenosis. There was no regurgitation. (prior mild) - Right ventricle: The cavity size was normal. Wall thickness was normal. Systolic function was normal. - Pericardium, extracardiac: A trivial pericardial effusion was identified. Features were not consistent with tamponade physiology.  ASSESSMENT AND PLAN:  1. Recent hypertensive encephalopathy - improved with medicine changes. Compliance felt to be a factor. BP ok here today by me. Norvasc refilled.   2. HLD - unclear to me why she is off statin - no recent lipids - will check today - probably restart low dose statin  3. Prior stroke - in 2017   4. Valvular heart disease - stable echo  5. First degree AV block - not on today's EKG - remains in sinus as well.   6. Remote syncope - has not recurred.   Current medicines are reviewed with the patient today.  The patient does not have concerns regarding medicines other than what has been noted above.  The following changes have been made:  See above.  Labs/ tests ordered today include:    Orders Placed This Encounter  Procedures  . EKG 12-Lead     Disposition:   FU with Dr. Anne Fu in 6 months.    Patient is agreeable to this plan and will call if any problems develop in the interim.   SignedNorma Fredrickson, NP  04/30/2018 3:31 PM  [facility] [address] [place], [place]  [address] Phone: [phone] Fax: [phone]

## 2018-04-30 NOTE — Patient Instructions (Addendum)
We will be checking the following labs today - lipids and LFTs today   Medication Instructions:    Continue with your current medicines. BUT  Ok to cut the aspirin back to 81 mg a day   Testing/Procedures To Be Arranged:  N/A  Follow-Up:   See Dr. Anne Fu in 6 months.     Other Special Instructions:   N/A    If you need a refill on your cardiac medications before your next appointment, please call your pharmacy.   Call the [facility] office at [phone] if you have any questions, problems or concerns.

## 2018-05-01 LAB — LIPID PANEL
Chol/HDL Ratio: 4.5 ratio — ABNORMAL HIGH (ref 0.0–4.4)
Cholesterol, Total: 282 mg/dL — ABNORMAL HIGH (ref 100–199)
HDL: 63 mg/dL (ref >39)
LDL Calculated: 187 mg/dL — ABNORMAL HIGH (ref 0–99)
Triglycerides: 161 mg/dL — ABNORMAL HIGH (ref 0–149)
VLDL Cholesterol Cal: 32 mg/dL (ref 5–40)

## 2018-05-01 LAB — HEPATIC FUNCTION PANEL
ALT: 10 IU/L (ref 0–32)
AST: 14 IU/L (ref 0–40)
Albumin: 4.1 g/dL (ref 3.5–4.8)
Alkaline Phosphatase: 98 IU/L (ref 39–117)
Bilirubin Total: <0.2 mg/dL (ref 0.0–1.2)
Bilirubin, Direct: 0.06 mg/dL (ref 0.00–0.40)
Total Protein: 7.5 g/dL (ref 6.0–8.5)

## 2018-05-19 ENCOUNTER — Telehealth: Payer: Self-pay | Admitting: *Deleted

## 2018-05-19 NOTE — Telephone Encounter (Signed)
lvm with lab results

## 2018-05-27 ENCOUNTER — Encounter: Payer: Self-pay | Admitting: *Deleted

## 2018-05-27 NOTE — Telephone Encounter (Signed)
Left message on machine for pt to contact the office. Will send a letter today.

## 2018-06-07 ENCOUNTER — Emergency Department (HOSPITAL_COMMUNITY): Payer: Medicare Other

## 2018-06-07 ENCOUNTER — Emergency Department (HOSPITAL_COMMUNITY)
Admission: EM | Admit: 2018-06-07 | Discharge: 2018-06-08 | Disposition: A | Discharge status: Home / Self Care | Payer: Medicare Other | Attending: Emergency Medicine | Admitting: Emergency Medicine

## 2018-06-07 ENCOUNTER — Other Ambulatory Visit: Payer: Self-pay

## 2018-06-07 DIAGNOSIS — E119 Type 2 diabetes mellitus without complications: Secondary | ICD-10-CM | POA: Diagnosis not present

## 2018-06-07 DIAGNOSIS — Z7982 Long term (current) use of aspirin: Secondary | ICD-10-CM | POA: Diagnosis not present

## 2018-06-07 DIAGNOSIS — Z8673 Personal history of transient ischemic attack (TIA), and cerebral infarction without residual deficits: Secondary | ICD-10-CM | POA: Insufficient documentation

## 2018-06-07 DIAGNOSIS — I110 Hypertensive heart disease with heart failure: Secondary | ICD-10-CM | POA: Diagnosis not present

## 2018-06-07 DIAGNOSIS — I5032 Chronic diastolic (congestive) heart failure: Secondary | ICD-10-CM | POA: Diagnosis not present

## 2018-06-07 DIAGNOSIS — R739 Hyperglycemia, unspecified: Secondary | ICD-10-CM

## 2018-06-07 DIAGNOSIS — E1165 Type 2 diabetes mellitus with hyperglycemia: Secondary | ICD-10-CM | POA: Diagnosis not present

## 2018-06-07 DIAGNOSIS — R55 Syncope and collapse: Secondary | ICD-10-CM | POA: Diagnosis present

## 2018-06-07 DIAGNOSIS — F0390 Unspecified dementia without behavioral disturbance: Secondary | ICD-10-CM | POA: Insufficient documentation

## 2018-06-07 LAB — CBC
HEMATOCRIT: 45.2 % (ref 36.0–46.0)
HEMOGLOBIN: 14.1 g/dL (ref 12.0–15.0)
MCH: 27.6 pg (ref 26.0–34.0)
MCHC: 31.2 g/dL (ref 30.0–36.0)
MCV: 88.5 fL (ref 78.0–100.0)
Platelets: 277 10*3/uL (ref 150–400)
RBC: 5.11 MIL/uL (ref 3.87–5.11)
RDW: 14.6 % (ref 11.5–15.5)
WBC: 7.5 10*3/uL (ref 4.0–10.5)

## 2018-06-07 LAB — BASIC METABOLIC PANEL
Anion gap: 11 (ref 5–15)
BUN: 18 mg/dL (ref 6–20)
CHLORIDE: 100 mmol/L — AB (ref 101–111)
CO2: 27 mmol/L (ref 22–32)
Calcium: 9.2 mg/dL (ref 8.9–10.3)
Creatinine, Ser: 0.85 mg/dL (ref 0.44–1.00)
GFR calc Af Amer: >60 mL/min (ref >60)
GLUCOSE: 245 mg/dL — AB (ref 65–99)
POTASSIUM: 4.3 mmol/L (ref 3.5–5.1)
Sodium: 138 mmol/L (ref 135–145)

## 2018-06-07 LAB — I-STAT TROPONIN, ED: Troponin i, poc: 0 ng/mL (ref 0.00–0.08)

## 2018-06-07 MED ORDER — HYDRALAZINE HCL 25 MG PO TABS
25.0000 mg | ORAL_TABLET | Freq: Once | ORAL | Status: AC
  Administered 2018-06-08: 25 mg via ORAL
  Filled 2018-06-07: qty 1

## 2018-06-07 NOTE — ED Provider Notes (Signed)
[facility] Provider Note   CSN: [id number] Arrival date & time: 06/07/18  2151     History   Chief Complaint Chief Complaint  Patient presents with  . Near Syncope  . Hypertension    HPI Dana Blair is a 77 y.o. female.  LEVEL 5 CAVEAT 2/2 DEMENTIA  77 year old female with a history of dementia, hypertension, diabetes, dyslipidemia, stroke presents to the emergency department for evaluation of altered mental status.  EMS reports that patient felt as though she was going to pass out tonight.  She had no syncope or near syncopal event, but family felt as though the patient was more altered than normal.  Altered mental status has been ongoing since Thursday.  She does have a history of dementia at baseline, but EMS is unaware how alert the patient usually is.  No family currently at bedside with the patient.  The patient, herself, has no complaints.  Specifically she denies headache, chest pain, shortness of breath, abdominal pain, nausea, vomiting.  History of prior admission in April for hypertensive encephalopathy.     Past Medical History:  Diagnosis Date  . Abnormality of gait 05/05/2016  . Acidosis   . Acute respiratory failure (HCC)   . Atrial flutter (HCC) 05/05/2016  . Atrioventricular bloc first degree   . Chronic diastolic heart failure (HCC)   . Chronic kidney disease   . Debility 05/05/2016  . Dehydration   . Dementia 05/05/2016  . Diabetes (HCC)    Diabetes mellitus with chronic kidney disease   . Dysphagia 05/05/2016  . Elevated white blood cell count   . Epilepsy with status epilepticus, not intractable (HCC)   . Glaucoma   . Hydronephrosis   . Hyperlipidemia   . Hypertension    [facility]- ECHO 06/29/09 - LVH, normal EF, renal ultrasound-no renal artery stenosis  . Orthostatic hypotension   . Osteopenia   . 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 She had concomitant respiratory failure. On May 22, 2008,  CT of the head without contrast demonstrated a 9 x 20 mm acute hematoma within the head of the right caudate with intraventricular extension of hemorrhage. Neurosurgery consult-no ventriculostomy.  . Syncope 07/26/2015  . Unspecified severe protein-calorie malnutrition [facility](HCC)     Patient Active Problem List   Diagnosis Date Noted  . Hypertensive encephalopathy 04/19/2018  . 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 Seizure (HCC) 12/29/2016  . Debility 05/05/2016  . Dysphagia 05/05/2016  . Abnormality of gait 05/05/2016  . Atrial flutter (HCC) 05/05/2016  . Dementia 05/05/2016  . GERD (gastroesophageal reflux disease) 05/02/2016  . Status epilepticus (HCC) 04/23/2016  . Encounter for central line placement   . Hydronephrosis   . Severe protein-calorie malnutrition (HCC) 07/28/2015  . Syncope and collapse   . Chronic diastolic congestive heart failure (HCC)   . LVH (left ventricular hypertrophy)   . Orthostatic hypotension   . Lactic acidosis   . AKI (acute kidney injury) (HCC) 07/26/2015  . Dehydration 07/26/2015  . Essential hypertension 08/13/2014  . Hyperlipidemia 08/13/2014  . History of stroke 08/13/2014  . DM2 (diabetes mellitus, type 2) (HCC) 08/13/2014  . Mild mitral regurgitation 08/13/2014  . 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 ABDOMINAL HYSTERECTOMY    . FLEXIBLE SIGMOIDOSCOPY N/A 07/30/2015   Procedure: FLEXIBLE SIGMOIDOSCOPY;  Surgeon: Dorena CookeyJohn Hayes, MD;  [facility];  Service: Endoscopy;  Laterality: N/A;  2 fleets enemas at 7:30 AM on July 30     OB History   None      Home Medications    Prior to Admission medications   Medication Sig Start Date End Date Taking? Authorizing Provider  amLODipine (NORVASC) 10 MG tablet TAKE ONE TABLET BY MOUTH ONCE DAILY TO CONTROL BLOOD PRESSURE 04/30/18   Rosalio Macadamia, NP  aspirin EC 81 MG tablet Take 1 tablet (81 mg total) by mouth daily. 04/30/18   Rosalio Macadamia, NP    hydrALAZINE (APRESOLINE) 25 MG tablet Take 1 tablet (25 mg total) by mouth every 8 (eight) hours. 04/21/18   Alwyn Ren, MD  JANUVIA 100 MG tablet Take 100 mg by mouth daily. 04/10/18   [provider]  levETIRAcetam (KEPPRA) 500 MG tablet TAKE TWO TABLETS BY MOUTH TWICE DAILY 12/02/17   Everlena Cooper, Adam R, DO  nebivolol (BYSTOLIC) 5 MG tablet Take 1 tablet (5 mg total) by mouth 2 (two) times daily. 04/21/18   Alwyn Ren, MD    Family History Family History  Problem Relation Age of Onset  . Diabetes Mother   . Diabetes Father   . Diabetes Sister     Social History Social History   Tobacco Use  . Smoking status: Never Smoker  . Smokeless tobacco: Never Used  Substance Use Topics  . Alcohol use: No  . Drug use: No     Allergies   Patient has no known allergies.   Review of Systems Review of Systems  Unable to perform ROS: Dementia     Physical Exam Updated Vital Signs BP (!) 184/76   Pulse 79   Temp 98.7 F (37.1 C) (Oral)   Resp 20   Ht 5\' 3"  (1.6 m)   Wt 52.2 kg (115 lb)   LMP  (LMP Unknown)   SpO2 99%   BMI 20.37 kg/m   Physical Exam  Constitutional: She is oriented to person, place, and time. She appears well-developed and well-nourished. No distress.  Patient pleasant, nontoxic.  HENT:  Head: Normocephalic and atraumatic.  Eyes: Conjunctivae and EOM are normal. No scleral icterus.  Neck: Normal range of motion.  Cardiovascular: Normal rate, regular rhythm and intact distal pulses.  Pulmonary/Chest: Effort normal. No stridor. No respiratory distress. She has no wheezes. She has no rales.  Lungs CTAB  Abdominal: Soft. She exhibits no distension and no mass. There is no tenderness. There is no guarding.  Soft, nontender  Musculoskeletal: Normal range of motion.  Neurological: She is alert and oriented to person, place, and time. She exhibits normal muscle tone. Coordination normal.  GCS 15. Alert. Oriented to self. Speech is goal  oriented. Confused. No cranial nerve deficits appreciated; symmetric eyebrow raise, no facial drooping, tongue midline. Patient has equal grip strength bilaterally with 5/5 strength against resistance in all major muscle groups bilaterally. Sensation to light touch intact.  Skin: Skin is warm and dry. No rash noted. She is not diaphoretic. No erythema. No pallor.  Psychiatric: She has a normal mood and affect. Her behavior is normal.  Nursing note and vitals reviewed.    ED Treatments / Results  Labs (all labs ordered are listed, but only abnormal results are displayed) Labs Reviewed  BASIC METABOLIC PANEL - Abnormal; Notable for the following components:      Result Value   Chloride 100 (*)    Glucose, Bld 245 (*)    All other components within normal limits  URINALYSIS, ROUTINE W  REFLEX MICROSCOPIC - Abnormal; Notable for the following components:   Color, Urine STRAW (*)    Glucose, UA >=500 (*)    Protein, ur 30 (*)    All other components within normal limits  CBC  I-STAT TROPONIN, ED    EKG EKG Interpretation  Date/Time:  Saturday June 07 2018 21:53:20 EDT Ventricular Rate:  78 PR Interval:    QRS Duration: 92 QT Interval:  365 QTC Calculation: 416 R Axis:   39 Text Interpretation:  Normal sinus rhythm Non-specific ST-t changes improved from first prior of April 19, 2018 Confirmed by Margarita Grizzle [phone]) on 06/07/2018 9:57:15 PM   Radiology Ct Head Wo Contrast  Result Date: 06/07/2018 CLINICAL DATA:  Near syncopal episode.  Dementia. EXAM: CT HEAD WITHOUT CONTRAST TECHNIQUE: Contiguous axial images were obtained from the base of the skull through the vertex without intravenous contrast. COMPARISON:  04/19/2018 FINDINGS: Brain: Ventricles, cisterns and other CSF spaces are within normal. There is moderate chronic ischemic microvascular disease. There is no mass, mass effect, shift of midline structures or acute hemorrhage. There is no evidence of acute infarction.  Vascular: No hyperdense vessel or unexpected calcification. Skull: Normal. Negative for fracture or focal lesion. Sinuses/Orbits: Orbits are normal and symmetric. Paranasal sinuses are well developed with minimal opacification over the left sphenoid sinus unchanged. Other: None. IMPRESSION: No acute findings. Moderate chronic ischemic microvascular disease. Electronically Signed   By: Elberta Fortis M.D.   On: 06/07/2018 23:39    Procedures Procedures (including critical care time)  Medications Ordered in ED Medications  hydrALAZINE (APRESOLINE) tablet 25 mg (25 mg Oral Given 06/08/18 0006)     11:39 PM Family now at bedside.  They confirm patient complaint of lightheadedness; deny syncope or near syncope prior to arrival.  Son reports that patient is usually fairly alert and oriented, but does have intermittent confusion throughout the day.  She has not had any recent falls or trauma.  They report compliance with her blood pressure medications.  On chart review of care everywhere, it seems that the patient's systolic blood pressure averaged between 150 and 170 systolic during admission in April.  This was after receiving IV hydralazine for systolic blood pressures between 220 and 240.  Currently, the patient does not appear far from her baseline.  Vitals have remained stable.   Initial Impression / Assessment and Plan / ED Course  I have reviewed the triage vital signs and the nursing notes.  Pertinent labs & imaging results that were available during my care of the patient were reviewed by me and considered in my medical decision making (see chart for details).     77 year old female with a history of dementia presents to the emergency department for complaints of lightheadedness.  Family states that she stated that she felt as though she was about to faint.  She had no syncope or near syncope prior to arrival.  In the emergency department, patient is pleasant, confused but consistent with  dementia history.  Her laboratory work-up in the emergency department shows hyperglycemia without other acute process.  No leukocytosis, electrolyte derangements.  Kidney function preserved.  Troponin negative.  Also no evidence of UTI.  A head CT was performed and shows no evidence of CVA, hemorrhage, hydrocephalus.  She has no focal deficits on exam.  Patient found to be hypertensive in the emergency department; however, blood pressure is fairly consistent with where she has been in the past.  She was admitted for hypertensive encephalopathy in  April, but systolic blood pressures at this time were between 220 and 240.  I believe the patient is appropriate for discharge and outpatient follow-up with her primary care doctor.  Return precautions discussed and provided. Patient discharged in stable condition; family with no unaddressed concerns.   Final Clinical Impressions(s) / ED Diagnoses   Final diagnoses:  Hyperglycemia    ED Discharge Orders    None       Antony Madura, PA-C 06/08/18 1610    Margarita Grizzle, MD 06/08/18 1539

## 2018-06-07 NOTE — ED Triage Notes (Signed)
Pt BIB [facility] for a near syncopal episode. 45min PTA pt told family she felt like she was going to pass out. Did not pass out or LOC. Pts family stated that she has been altered more than normal since Thursday. Pt has hx of dementia. A+Ox2 per report unsure if that is baseline

## 2018-06-08 LAB — URINALYSIS, ROUTINE W REFLEX MICROSCOPIC
BACTERIA UA: NONE SEEN
BILIRUBIN URINE: NEGATIVE
Glucose, UA: >=500 mg/dL — AB
Hgb urine dipstick: NEGATIVE
KETONES UR: NEGATIVE mg/dL
Leukocytes, UA: NEGATIVE
NITRITE: NEGATIVE
PROTEIN: 30 mg/dL — AB
Specific Gravity, Urine: 1.011 (ref 1.005–1.030)
pH: 7 (ref 5.0–8.0)

## 2018-06-08 NOTE — ED Notes (Signed)
pts family understood dc material. NAD noted.  

## 2018-06-08 NOTE — Discharge Instructions (Signed)
Your work-up in the emergency department today was reassuring.  Your sugar was high.  We recommend that you watch your diet and continue your daily prescribed medications.  Have your blood pressure rechecked by your primary care doctor.  You would benefit from follow-up on Monday or Tuesday.  It is likely that worsening confusion may be related to known dementia.  Dementia can progress and worsen with age.  Return to the ED for any new or concerning symptoms.

## 2018-08-31 ENCOUNTER — Emergency Department (HOSPITAL_COMMUNITY): Payer: Medicare Other

## 2018-08-31 ENCOUNTER — Emergency Department (HOSPITAL_COMMUNITY)
Admission: EM | Admit: 2018-08-31 | Discharge: 2018-08-31 | Disposition: A | Discharge status: Home / Self Care | Payer: Medicare Other | Attending: Emergency Medicine | Admitting: Emergency Medicine

## 2018-08-31 ENCOUNTER — Encounter (HOSPITAL_COMMUNITY): Payer: Self-pay

## 2018-08-31 DIAGNOSIS — I130 Hypertensive heart and chronic kidney disease with heart failure and stage 1 through stage 4 chronic kidney disease, or unspecified chronic kidney disease: Secondary | ICD-10-CM | POA: Diagnosis not present

## 2018-08-31 DIAGNOSIS — R569 Unspecified convulsions: Secondary | ICD-10-CM

## 2018-08-31 DIAGNOSIS — I5032 Chronic diastolic (congestive) heart failure: Secondary | ICD-10-CM | POA: Diagnosis not present

## 2018-08-31 DIAGNOSIS — G40909 Epilepsy, unspecified, not intractable, without status epilepticus: Secondary | ICD-10-CM | POA: Diagnosis not present

## 2018-08-31 DIAGNOSIS — N189 Chronic kidney disease, unspecified: Secondary | ICD-10-CM | POA: Diagnosis not present

## 2018-08-31 DIAGNOSIS — Z7982 Long term (current) use of aspirin: Secondary | ICD-10-CM | POA: Insufficient documentation

## 2018-08-31 DIAGNOSIS — Z79899 Other long term (current) drug therapy: Secondary | ICD-10-CM | POA: Diagnosis not present

## 2018-08-31 DIAGNOSIS — R4182 Altered mental status, unspecified: Secondary | ICD-10-CM | POA: Diagnosis present

## 2018-08-31 DIAGNOSIS — F0390 Unspecified dementia without behavioral disturbance: Secondary | ICD-10-CM | POA: Insufficient documentation

## 2018-08-31 LAB — I-STAT CG4 LACTIC ACID, ED: LACTIC ACID, VENOUS: 4.78 mmol/L — AB (ref 0.5–1.9)

## 2018-08-31 LAB — CBC WITH DIFFERENTIAL/PLATELET
Abs Immature Granulocytes: 0 10*3/uL (ref 0.0–0.1)
BASOS ABS: 0 10*3/uL (ref 0.0–0.1)
Basophils Relative: 0 %
EOS ABS: 0 10*3/uL (ref 0.0–0.7)
EOS PCT: 0 %
HCT: 51.7 % — ABNORMAL HIGH (ref 36.0–46.0)
Hemoglobin: 16.3 g/dL — ABNORMAL HIGH (ref 12.0–15.0)
Immature Granulocytes: 0 %
Lymphocytes Relative: 16 %
Lymphs Abs: 1.5 10*3/uL (ref 0.7–4.0)
MCH: 27.6 pg (ref 26.0–34.0)
MCHC: 31.5 g/dL (ref 30.0–36.0)
MCV: 87.5 fL (ref 78.0–100.0)
Monocytes Absolute: 0.5 10*3/uL (ref 0.1–1.0)
Monocytes Relative: 6 %
NEUTROS PCT: 78 %
Neutro Abs: 7.2 10*3/uL (ref 1.7–7.7)
PLATELETS: 224 10*3/uL (ref 150–400)
RBC: 5.91 MIL/uL — AB (ref 3.87–5.11)
RDW: 12.8 % (ref 11.5–15.5)
WBC: 9.4 10*3/uL (ref 4.0–10.5)

## 2018-08-31 LAB — I-STAT CHEM 8, ED
BUN: 15 mg/dL (ref 8–23)
CALCIUM ION: 0.91 mmol/L — AB (ref 1.15–1.40)
CREATININE: 0.8 mg/dL (ref 0.44–1.00)
Chloride: 102 mmol/L (ref 98–111)
GLUCOSE: 153 mg/dL — AB (ref 70–99)
HCT: 52 % — ABNORMAL HIGH (ref 36.0–46.0)
HEMOGLOBIN: 17.7 g/dL — AB (ref 12.0–15.0)
POTASSIUM: 3.5 mmol/L (ref 3.5–5.1)
Sodium: 136 mmol/L (ref 135–145)
TCO2: 25 mmol/L (ref 22–32)

## 2018-08-31 LAB — URINALYSIS, ROUTINE W REFLEX MICROSCOPIC
Bilirubin Urine: NEGATIVE
Glucose, UA: NEGATIVE mg/dL
Ketones, ur: NEGATIVE mg/dL
NITRITE: NEGATIVE
PH: 8 (ref 5.0–8.0)
Protein, ur: 30 mg/dL — AB
SPECIFIC GRAVITY, URINE: 1.008 (ref 1.005–1.030)

## 2018-08-31 LAB — COMPREHENSIVE METABOLIC PANEL
ALT: 11 U/L (ref 0–44)
AST: 22 U/L (ref 15–41)
Albumin: 4.1 g/dL (ref 3.5–5.0)
Alkaline Phosphatase: 83 U/L (ref 38–126)
Anion gap: 15 (ref 5–15)
BUN: 11 mg/dL (ref 8–23)
CALCIUM: 9.4 mg/dL (ref 8.9–10.3)
CHLORIDE: 98 mmol/L (ref 98–111)
CO2: 26 mmol/L (ref 22–32)
CREATININE: 0.86 mg/dL (ref 0.44–1.00)
GFR calc non Af Amer: >60 mL/min (ref >60)
Glucose, Bld: 146 mg/dL — ABNORMAL HIGH (ref 70–99)
Potassium: 3.8 mmol/L (ref 3.5–5.1)
SODIUM: 139 mmol/L (ref 135–145)
Total Bilirubin: 0.7 mg/dL (ref 0.3–1.2)
Total Protein: 8.5 g/dL — ABNORMAL HIGH (ref 6.5–8.1)

## 2018-08-31 LAB — I-STAT VENOUS BLOOD GAS, ED
ACID-BASE EXCESS: 1 mmol/L (ref 0.0–2.0)
Bicarbonate: 26 mmol/L (ref 20.0–28.0)
O2 Saturation: 79 %
PO2 VEN: 43 mmHg (ref 32.0–45.0)
TCO2: 27 mmol/L (ref 22–32)
pCO2, Ven: 40 mmHg — ABNORMAL LOW (ref 44.0–60.0)
pH, Ven: 7.421 (ref 7.250–7.430)

## 2018-08-31 LAB — ETHANOL

## 2018-08-31 LAB — AMMONIA: Ammonia: 20 umol/L (ref 9–35)

## 2018-08-31 LAB — CBG MONITORING, ED: GLUCOSE-CAPILLARY: 122 mg/dL — AB (ref 70–99)

## 2018-08-31 MED ORDER — NEBIVOLOL HCL 10 MG PO TABS
10.0000 mg | ORAL_TABLET | Freq: Every day | ORAL | Status: DC
  Administered 2018-08-31: 10 mg via ORAL
  Filled 2018-08-31 (×2): qty 1

## 2018-08-31 MED ORDER — HYDRALAZINE HCL 25 MG PO TABS
25.0000 mg | ORAL_TABLET | Freq: Once | ORAL | Status: AC
  Administered 2018-08-31: 25 mg via ORAL
  Filled 2018-08-31: qty 1

## 2018-08-31 MED ORDER — SODIUM CHLORIDE 0.9 % IV BOLUS
1000.0000 mL | Freq: Once | INTRAVENOUS | Status: AC
  Administered 2018-08-31: 1000 mL via INTRAVENOUS

## 2018-08-31 MED ORDER — AMLODIPINE BESYLATE 5 MG PO TABS
10.0000 mg | ORAL_TABLET | Freq: Once | ORAL | Status: AC
  Administered 2018-08-31: 10 mg via ORAL
  Filled 2018-08-31: qty 2

## 2018-08-31 MED ORDER — LEVETIRACETAM IN NACL 1000 MG/100ML IV SOLN
1000.0000 mg | Freq: Once | INTRAVENOUS | Status: AC
  Administered 2018-08-31: 1000 mg via INTRAVENOUS
  Filled 2018-08-31: qty 100

## 2018-08-31 NOTE — ED Notes (Signed)
Pt. Placed on purewick.  

## 2018-08-31 NOTE — ED Triage Notes (Signed)
Pt from home via ems; son called EMS after he found her in a "dazed state" in the bathroom; hx dementia but normally more alert; alert to person only on ems arrival;son endorses diarrhea and polyuria over last couple of weeks; becoming more alert w/ ems; hx uti

## 2018-08-31 NOTE — Discharge Instructions (Signed)
Call your PCP and your neurologist on Tuesday morning.  Let them know you had this event.   Return for fever, shortness of breath, abdominal pain.

## 2018-08-31 NOTE — ED Notes (Signed)
Patient transported to X-ray 

## 2018-08-31 NOTE — ED Notes (Signed)
MD notified of pt's HTN 

## 2018-08-31 NOTE — ED Notes (Signed)
MD notified of pt's continued HTN

## 2018-08-31 NOTE — ED Provider Notes (Signed)
[facility] EMERGENCY DEPARTMENT Provider Note   CSN: [id number] Arrival date & time: 08/31/18  1025     History   Chief Complaint Chief Complaint  Patient presents with  . Altered Mental Status    HPI Dana Blair is a 77 y.o. female.  77 yo F with a cc of AMS this was noted earlier this morning by the son.  Stated that she was more confused than normal.  Found on the toilet in the bathroom.  EMS felt that she slowly improved throughout their transport.  She also has been having increased urination and some diarrhea.  Patient is demented at baseline.  She denies any diarrhea or abdominal pain or chest pain.  Denies any discomfort currently.  She denies head injury denies cough congestion or fever.  The history is provided by the patient.  Altered Mental Status   This is a new problem. The current episode started 3 to 5 hours ago. The problem has been gradually improving. Associated symptoms include confusion. Her past medical history is significant for seizures.  Illness  This is a new problem. The current episode started 1 to 2 hours ago. The problem occurs rarely. The problem has been resolved. Pertinent negatives include no chest pain, no headaches and no shortness of breath. Nothing aggravates the symptoms. Nothing relieves the symptoms. She has tried nothing for the symptoms. The treatment provided no relief.    Past Medical History:  Diagnosis Date  . Abnormality of gait 05/05/2016  . Acidosis   . Acute respiratory failure (HCC)   . Atrial flutter (HCC) 05/05/2016  . Atrioventricular bloc first degree   . Chronic diastolic heart failure (HCC)   . Chronic kidney disease   . Debility 05/05/2016  . Dehydration   . Dementia 05/05/2016  . Diabetes (HCC)    Diabetes mellitus with chronic kidney disease   . Dysphagia 05/05/2016  . Elevated white blood cell count   . Epilepsy with status epilepticus, not intractable (HCC)   . Glaucoma   . Hydronephrosis   .  Hyperlipidemia   . Hypertension    [facility]- ECHO 06/29/09 - LVH, normal EF, renal ultrasound-no renal artery stenosis  . Orthostatic hypotension   . Osteopenia   . 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 She had concomitant respiratory failure. On May 22, 2008, CT of the head without contrast demonstrated a 9 x 20 mm acute hematoma within the head of the right caudate with intraventricular extension of hemorrhage. Neurosurgery consult-no ventriculostomy.  . Syncope 07/26/2015  . Unspecified severe protein-calorie malnutrition [facility])     Patient Active Problem List   Diagnosis Date Noted  . Hypertensive encephalopathy 04/19/2018  . 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 Seizure (HCC) 12/29/2016  . Debility 05/05/2016  . Dysphagia 05/05/2016  . Abnormality of gait 05/05/2016  . Atrial flutter (HCC) 05/05/2016  . Dementia 05/05/2016  . GERD (gastroesophageal reflux disease) 05/02/2016  . Status epilepticus (HCC) 04/23/2016  . Encounter for central line placement   . Hydronephrosis   . Severe protein-calorie malnutrition (HCC) 07/28/2015  . Syncope and collapse   . Chronic diastolic congestive heart failure (HCC)   . LVH (left ventricular hypertrophy)   . Orthostatic hypotension   . Lactic acidosis   . AKI (acute kidney injury) (HCC) 07/26/2015  . Dehydration 07/26/2015  . Essential hypertension 08/13/2014  . Hyperlipidemia 08/13/2014  . History of stroke 08/13/2014  . DM2 (diabetes mellitus, type 2) (HCC) 08/13/2014  .  Mild mitral regurgitation 08/13/2014  . 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 ABDOMINAL HYSTERECTOMY    . FLEXIBLE SIGMOIDOSCOPY N/A 07/30/2015   Procedure: FLEXIBLE SIGMOIDOSCOPY;  Surgeon: Dorena Cookey, MD;  Location: [facility] ENDOSCOPY;  Service: Endoscopy;  Laterality: N/A;  2 fleets enemas at 7:30 AM on July 30     OB History   None      Home Medications    Prior to Admission medications   Medication  Sig Start Date End Date Taking? Authorizing Provider  amLODipine (NORVASC) 10 MG tablet TAKE ONE TABLET BY MOUTH ONCE DAILY TO CONTROL BLOOD PRESSURE Patient taking differently: Take 10 mg by mouth daily.  04/30/18  Yes Rosalio Macadamia, NP  aspirin EC 81 MG tablet Take 1 tablet (81 mg total) by mouth daily. 04/30/18  Yes Rosalio Macadamia, NP  BYSTOLIC 10 MG tablet Take 10 mg by mouth daily. 06/27/18  Yes [provider]  escitalopram (LEXAPRO) 5 MG tablet Take 5 mg by mouth daily. 08/10/18  Yes [provider]  JANUVIA 100 MG tablet Take 100 mg by mouth daily. 04/10/18  Yes [provider]  levETIRAcetam (KEPPRA) 500 MG tablet TAKE TWO TABLETS BY MOUTH TWICE DAILY Patient taking differently: Take 1,000 mg by mouth 2 (two) times daily.  12/02/17  Yes Jaffe, Adam R, DO  hydrALAZINE (APRESOLINE) 25 MG tablet Take 1 tablet (25 mg total) by mouth every 8 (eight) hours. Patient not taking: Reported on 08/31/2018 04/21/18   Alwyn Ren, MD  nebivolol (BYSTOLIC) 5 MG tablet Take 1 tablet (5 mg total) by mouth 2 (two) times daily. Patient not taking: Reported on 08/31/2018 04/21/18   Alwyn Ren, MD    Family History Family History  Problem Relation Age of Onset  . Diabetes Mother   . Diabetes Father   . Diabetes Sister     Social History Social History   Tobacco Use  . Smoking status: Never Smoker  . Smokeless tobacco: Never Used  Substance Use Topics  . Alcohol use: No  . Drug use: No     Allergies   Patient has no known allergies.   Review of Systems Review of Systems  Constitutional: Negative for chills and fever.  HENT: Negative for congestion and rhinorrhea.   Eyes: Negative for redness and visual disturbance.  Respiratory: Negative for shortness of breath and wheezing.   Cardiovascular: Negative for chest pain and palpitations.  Gastrointestinal: Negative for nausea and vomiting.  Genitourinary: Negative for dysuria and urgency.    Musculoskeletal: Negative for arthralgias and myalgias.  Skin: Negative for pallor and wound.  Neurological: Negative for dizziness and headaches.  Psychiatric/Behavioral: Positive for confusion.     Physical Exam Updated Vital Signs BP (!) 190/96   Pulse 92   Temp (!) 97.5 F (36.4 C) (Rectal)   Resp (!) 21   LMP  (LMP Unknown)   SpO2 100%   Physical Exam  Constitutional: She appears well-developed and well-nourished. No distress.  Chronically ill-appearing  HENT:  Head: Normocephalic and atraumatic.  Eyes: Pupils are equal, round, and reactive to light. EOM are normal.  Neck: Normal range of motion. Neck supple.  Cardiovascular: Normal rate and regular rhythm. Exam reveals no gallop and no friction rub.  No murmur heard. Pulmonary/Chest: Effort normal. She has no wheezes. She has no rales.  Abdominal: Soft. She exhibits no distension. There is no tenderness.  Musculoskeletal: She exhibits no edema  or tenderness.  Neurological: She is alert.  Oriented to person able to answer questions.  Skin: Skin is warm and dry. She is not diaphoretic.  Psychiatric: She has a normal mood and affect. Her behavior is normal.  Nursing note and vitals reviewed.    ED Treatments / Results  Labs (all labs ordered are listed, but only abnormal results are displayed) Labs Reviewed  COMPREHENSIVE METABOLIC PANEL - Abnormal; Notable for the following components:      Result Value   Glucose, Bld 146 (*)    Total Protein 8.5 (*)    All other components within normal limits  CBC WITH DIFFERENTIAL/PLATELET - Abnormal; Notable for the following components:   RBC 5.91 (*)    Hemoglobin 16.3 (*)    HCT 51.7 (*)    All other components within normal limits  URINALYSIS, ROUTINE W REFLEX MICROSCOPIC - Abnormal; Notable for the following components:   APPearance CLOUDY (*)    Hgb urine dipstick LARGE (*)    Protein, ur 30 (*)    Leukocytes, UA MODERATE (*)    Bacteria, UA RARE (*)    All  other components within normal limits  CBG MONITORING, ED - Abnormal; Notable for the following components:   Glucose-Capillary 122 (*)    All other components within normal limits  I-STAT CHEM 8, ED - Abnormal; Notable for the following components:   Glucose, Bld 153 (*)    Calcium, Ion 0.91 (*)    Hemoglobin 17.7 (*)    HCT 52.0 (*)    All other components within normal limits  I-STAT CG4 LACTIC ACID, ED - Abnormal; Notable for the following components:   Lactic Acid, Venous 4.78 (*)    All other components within normal limits  I-STAT VENOUS BLOOD GAS, ED - Abnormal; Notable for the following components:   pCO2, Ven 40.0 (*)    All other components within normal limits  URINE CULTURE  AMMONIA  ETHANOL    EKG EKG Interpretation  Date/Time:  Sunday August 31 2018 10:38:02 EDT Ventricular Rate:  54 PR Interval:    QRS Duration: 117 QT Interval:  528 QTC Calculation: 501 R Axis:   165 Text Interpretation:  Sinus rhythm Low voltage, extremity leads Consider left ventricular hypertrophy Prolonged QT interval Baseline wander in lead(s) I II aVR TECHNICALLY DIFFICULT Otherwise no significant change Confirmed by Melene Plan [phone]) on 08/31/2018 11:25:41 AM   Radiology Dg Chest 2 View  Result Date: 08/31/2018 CLINICAL DATA:  Altered mental status today.  History of dementia. EXAM: CHEST - 2 VIEW COMPARISON:  PA and lateral chest 04/19/2018. Single-view of the chest 07/26/2015. FINDINGS: The lungs are clear. Heart size is upper normal. Aortic atherosclerosis is noted. No pneumothorax or pleural effusion. No acute or focal bony abnormality. IMPRESSION: No acute disease. Atherosclerosis. Electronically Signed   By: Drusilla Kanner M.D.   On: 08/31/2018 12:46   Ct Head Wo Contrast  Result Date: 08/31/2018 CLINICAL DATA:  Pt's son called EMS after he found her in a "dazed state" in the bathroom; hx dementia but normally more alert; alert to person only,. EXAM: CT HEAD WITHOUT CONTRAST  TECHNIQUE: Contiguous axial images were obtained from the base of the skull through the vertex without intravenous contrast. COMPARISON:  06/07/2018 FINDINGS: Brain: Significant periventricular white matter changes. Mild central cortical atrophy. There is no intra or extra-axial fluid collection or mass lesion. The basilar cisterns and ventricles have a normal appearance. Vascular: There is dense atherosclerotic calcification of the  internal carotid arteries. Skull: Normal. Negative for fracture or focal lesion. Sinuses/Orbits: No acute finding. Other: None. IMPRESSION: 1. Atrophy and small vessel disease. 2.  No evidence for acute intracranial abnormality. Electronically Signed   By: Norva Pavlov M.D.   On: 08/31/2018 13:02    Procedures Procedures (including critical care time)  Medications Ordered in ED Medications  nebivolol (BYSTOLIC) tablet 10 mg (has no administration in time range)  amLODipine (NORVASC) tablet 10 mg (has no administration in time range)  sodium chloride 0.9 % bolus 1,000 mL (0 mLs Intravenous Stopped 08/31/18 1219)  hydrALAZINE (APRESOLINE) tablet 25 mg (25 mg Oral Given 08/31/18 1126)  levETIRAcetam (KEPPRA) IVPB 1000 mg/100 mL premix (0 mg Intravenous Stopped 08/31/18 1255)     Initial Impression / Assessment and Plan / ED Course  I have reviewed the triage vital signs and the nursing notes.  Pertinent labs & imaging results that were available during my care of the patient were reviewed by me and considered in my medical decision making (see chart for details).     77 yo F with a chief complaint of altered mental status.  This was noted by the son this morning.  She was apparently confused on the toilet.  I am unsure of the etiology of this.  Will obtain an altered mental status work-up.  She is had multiple urinary tract infections in the past and is a possible cause.  With further record review the patient has a history of seizures in the past.  She is on Keppra at  home.  We will load her through the IV.  The patient is noted to have a lactate>4. With the current information available to me, I don't think the patient is in septic shock. The lactate>4, is related to seizure activity.  The patient is been back to her baseline for some time.  Her urine is negative for infection.  Chest x-ray negative for infection.  She has no abdominal tenderness.  I doubt that the lactic acid is related to an infectious complaint.  Most likely this seems to be seizure-like activity.  She is back to her baseline discussed with the family will discharge home.  We will have her follow-up with her primary care provider as well as her neurologist.  Return for fever cough congestion abdominal pain vomiting or diarrhea.  2:39 PM:  I have discussed the diagnosis/risks/treatment options with the patient and family and believe the pt to be eligible for discharge home to follow-up with PCP. We also discussed returning to the ED immediately if new or worsening sx occur. We discussed the sx which are most concerning (e.g., sudden worsening pain, fever, inability to tolerate by mouth) that necessitate immediate return. Medications administered to the patient during their visit and any new prescriptions provided to the patient are listed below.  Medications given during this visit Medications  nebivolol (BYSTOLIC) tablet 10 mg (has no administration in time range)  amLODipine (NORVASC) tablet 10 mg (has no administration in time range)  sodium chloride 0.9 % bolus 1,000 mL (0 mLs Intravenous Stopped 08/31/18 1219)  hydrALAZINE (APRESOLINE) tablet 25 mg (25 mg Oral Given 08/31/18 1126)  levETIRAcetam (KEPPRA) IVPB 1000 mg/100 mL premix (0 mg Intravenous Stopped 08/31/18 1255)      The patient appears reasonably screen and/or stabilized for discharge and I doubt any other medical condition or other [facility] requiring further screening, evaluation, or treatment in the ED at this time prior to discharge.  Final Clinical Impressions(s) / ED Diagnoses   Final diagnoses:  Seizure [facility])    ED Discharge Orders    None       Melene Plan, DO 08/31/18 1439

## 2018-08-31 NOTE — ED Notes (Signed)
Dr.Floyd made aware of pt Lactic Acid results. ED-Lab.

## 2018-09-01 ENCOUNTER — Encounter (HOSPITAL_COMMUNITY): Payer: Self-pay

## 2018-09-01 ENCOUNTER — Observation Stay (HOSPITAL_COMMUNITY)
Admission: EM | Admit: 2018-09-01 | Discharge: 2018-09-04 | Disposition: A | Discharge status: Skilled Nursing Facility | Payer: Medicare Other | Attending: Internal Medicine | Admitting: Internal Medicine

## 2018-09-01 DIAGNOSIS — Z7984 Long term (current) use of oral hypoglycemic drugs: Secondary | ICD-10-CM | POA: Insufficient documentation

## 2018-09-01 DIAGNOSIS — E872 Acidosis, unspecified: Secondary | ICD-10-CM | POA: Diagnosis present

## 2018-09-01 DIAGNOSIS — E860 Dehydration: Secondary | ICD-10-CM

## 2018-09-01 DIAGNOSIS — N179 Acute kidney failure, unspecified: Secondary | ICD-10-CM | POA: Diagnosis not present

## 2018-09-01 DIAGNOSIS — K5730 Diverticulosis of large intestine without perforation or abscess without bleeding: Secondary | ICD-10-CM | POA: Diagnosis not present

## 2018-09-01 DIAGNOSIS — I5032 Chronic diastolic (congestive) heart failure: Secondary | ICD-10-CM | POA: Diagnosis present

## 2018-09-01 DIAGNOSIS — R748 Abnormal levels of other serum enzymes: Secondary | ICD-10-CM | POA: Insufficient documentation

## 2018-09-01 DIAGNOSIS — R2681 Unsteadiness on feet: Secondary | ICD-10-CM | POA: Diagnosis not present

## 2018-09-01 DIAGNOSIS — N189 Chronic kidney disease, unspecified: Secondary | ICD-10-CM | POA: Insufficient documentation

## 2018-09-01 DIAGNOSIS — R339 Retention of urine, unspecified: Secondary | ICD-10-CM | POA: Diagnosis present

## 2018-09-01 DIAGNOSIS — R001 Bradycardia, unspecified: Secondary | ICD-10-CM | POA: Diagnosis not present

## 2018-09-01 DIAGNOSIS — I10 Essential (primary) hypertension: Secondary | ICD-10-CM | POA: Diagnosis present

## 2018-09-01 DIAGNOSIS — I130 Hypertensive heart and chronic kidney disease with heart failure and stage 1 through stage 4 chronic kidney disease, or unspecified chronic kidney disease: Secondary | ICD-10-CM | POA: Insufficient documentation

## 2018-09-01 DIAGNOSIS — R778 Other specified abnormalities of plasma proteins: Secondary | ICD-10-CM

## 2018-09-01 DIAGNOSIS — F0390 Unspecified dementia without behavioral disturbance: Secondary | ICD-10-CM | POA: Diagnosis not present

## 2018-09-01 DIAGNOSIS — R740 Nonspecific elevation of levels of transaminase and lactic acid dehydrogenase [LDH]: Secondary | ICD-10-CM | POA: Diagnosis not present

## 2018-09-01 DIAGNOSIS — R52 Pain, unspecified: Secondary | ICD-10-CM

## 2018-09-01 DIAGNOSIS — E1122 Type 2 diabetes mellitus with diabetic chronic kidney disease: Secondary | ICD-10-CM | POA: Diagnosis not present

## 2018-09-01 DIAGNOSIS — Z7982 Long term (current) use of aspirin: Secondary | ICD-10-CM | POA: Insufficient documentation

## 2018-09-01 DIAGNOSIS — R55 Syncope and collapse: Principal | ICD-10-CM | POA: Diagnosis present

## 2018-09-01 DIAGNOSIS — E11649 Type 2 diabetes mellitus with hypoglycemia without coma: Secondary | ICD-10-CM | POA: Diagnosis not present

## 2018-09-01 DIAGNOSIS — Z8673 Personal history of transient ischemic attack (TIA), and cerebral infarction without residual deficits: Secondary | ICD-10-CM | POA: Diagnosis not present

## 2018-09-01 DIAGNOSIS — G40901 Epilepsy, unspecified, not intractable, with status epilepticus: Secondary | ICD-10-CM | POA: Diagnosis not present

## 2018-09-01 DIAGNOSIS — R7989 Other specified abnormal findings of blood chemistry: Secondary | ICD-10-CM

## 2018-09-01 DIAGNOSIS — E119 Type 2 diabetes mellitus without complications: Secondary | ICD-10-CM

## 2018-09-01 DIAGNOSIS — R569 Unspecified convulsions: Secondary | ICD-10-CM

## 2018-09-01 DIAGNOSIS — R197 Diarrhea, unspecified: Secondary | ICD-10-CM | POA: Diagnosis not present

## 2018-09-01 DIAGNOSIS — E876 Hypokalemia: Secondary | ICD-10-CM | POA: Insufficient documentation

## 2018-09-01 DIAGNOSIS — Z79899 Other long term (current) drug therapy: Secondary | ICD-10-CM | POA: Diagnosis not present

## 2018-09-01 LAB — COMPREHENSIVE METABOLIC PANEL
ALT: 15 U/L (ref 0–44)
ANION GAP: 15 (ref 5–15)
AST: 36 U/L (ref 15–41)
Albumin: 3.9 g/dL (ref 3.5–5.0)
Alkaline Phosphatase: 82 U/L (ref 38–126)
BUN: 17 mg/dL (ref 8–23)
CALCIUM: 9.7 mg/dL (ref 8.9–10.3)
CHLORIDE: 100 mmol/L (ref 98–111)
CO2: 26 mmol/L (ref 22–32)
CREATININE: 1.67 mg/dL — AB (ref 0.44–1.00)
GFR, EST AFRICAN AMERICAN: 33 mL/min — AB (ref >60)
GFR, EST NON AFRICAN AMERICAN: 28 mL/min — AB (ref >60)
Glucose, Bld: 185 mg/dL — ABNORMAL HIGH (ref 70–99)
Potassium: 4.1 mmol/L (ref 3.5–5.1)
SODIUM: 141 mmol/L (ref 135–145)
TOTAL PROTEIN: 8 g/dL (ref 6.5–8.1)
Total Bilirubin: 1.4 mg/dL — ABNORMAL HIGH (ref 0.3–1.2)

## 2018-09-01 LAB — CBC WITH DIFFERENTIAL/PLATELET
ABS IMMATURE GRANULOCYTES: 0.1 10*3/uL (ref 0.0–0.1)
BASOS ABS: 0 10*3/uL (ref 0.0–0.1)
BASOS PCT: 0 %
EOS ABS: 0 10*3/uL (ref 0.0–0.7)
EOS PCT: 0 %
HCT: 48.4 % — ABNORMAL HIGH (ref 36.0–46.0)
Hemoglobin: 15.5 g/dL — ABNORMAL HIGH (ref 12.0–15.0)
Immature Granulocytes: 1 %
Lymphocytes Relative: 10 %
Lymphs Abs: 1.2 10*3/uL (ref 0.7–4.0)
MCH: 27.8 pg (ref 26.0–34.0)
MCHC: 32 g/dL (ref 30.0–36.0)
MCV: 86.7 fL (ref 78.0–100.0)
MONO ABS: 1 10*3/uL (ref 0.1–1.0)
MONOS PCT: 8 %
Neutro Abs: 10.2 10*3/uL — ABNORMAL HIGH (ref 1.7–7.7)
Neutrophils Relative %: 81 %
PLATELETS: 229 10*3/uL (ref 150–400)
RBC: 5.58 MIL/uL — ABNORMAL HIGH (ref 3.87–5.11)
RDW: 13.1 % (ref 11.5–15.5)
WBC: 12.5 10*3/uL — ABNORMAL HIGH (ref 4.0–10.5)

## 2018-09-01 LAB — URINE CULTURE

## 2018-09-01 LAB — LACTIC ACID, PLASMA: LACTIC ACID, VENOUS: 3.5 mmol/L — AB (ref 0.5–1.9)

## 2018-09-01 LAB — TROPONIN I: TROPONIN I: 0.04 ng/mL — AB (ref <0.03)

## 2018-09-01 MED ORDER — SODIUM CHLORIDE 0.9 % IV BOLUS
500.0000 mL | Freq: Once | INTRAVENOUS | Status: AC
  Administered 2018-09-02: 500 mL via INTRAVENOUS

## 2018-09-01 MED ORDER — SODIUM CHLORIDE 0.9 % IV BOLUS
500.0000 mL | Freq: Once | INTRAVENOUS | Status: AC
  Administered 2018-09-01: 500 mL via INTRAVENOUS

## 2018-09-01 MED ORDER — IOPAMIDOL (ISOVUE-300) INJECTION 61%
INTRAVENOUS | Status: AC
  Filled 2018-09-01: qty 100

## 2018-09-01 NOTE — ED Notes (Signed)
Leonia Reader PA notified on pt.'s elevated Troponin  result.

## 2018-09-01 NOTE — ED Notes (Signed)
Patient transported to CT scan . 
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
## 2018-09-01 NOTE — ED Notes (Signed)
Purewick placed on Pt.

## 2018-09-01 NOTE — ED Triage Notes (Signed)
Pt arrived via [facility] from home c/o syncope and diarrhea for a couple weeks.  Pt was seen here yesterday.  CBG 145.

## 2018-09-01 NOTE — ED Notes (Signed)
Family at bedside. 

## 2018-09-01 NOTE — ED Notes (Signed)
Leonia Reader PA notified on pt.'s lactic acid result .

## 2018-09-02 ENCOUNTER — Emergency Department (HOSPITAL_COMMUNITY): Payer: Medicare Other

## 2018-09-02 ENCOUNTER — Ambulatory Visit (HOSPITAL_BASED_OUTPATIENT_CLINIC_OR_DEPARTMENT_OTHER): Payer: Medicare Other

## 2018-09-02 ENCOUNTER — Encounter (HOSPITAL_COMMUNITY): Payer: Self-pay | Admitting: Family Medicine

## 2018-09-02 ENCOUNTER — Observation Stay (HOSPITAL_COMMUNITY): Payer: Medicare Other

## 2018-09-02 DIAGNOSIS — Z794 Long term (current) use of insulin: Secondary | ICD-10-CM

## 2018-09-02 DIAGNOSIS — E1100 Type 2 diabetes mellitus with hyperosmolarity without nonketotic hyperglycemic-hyperosmolar coma (NKHHC): Secondary | ICD-10-CM

## 2018-09-02 DIAGNOSIS — I5030 Unspecified diastolic (congestive) heart failure: Secondary | ICD-10-CM

## 2018-09-02 DIAGNOSIS — R569 Unspecified convulsions: Secondary | ICD-10-CM

## 2018-09-02 DIAGNOSIS — R748 Abnormal levels of other serum enzymes: Secondary | ICD-10-CM | POA: Diagnosis not present

## 2018-09-02 DIAGNOSIS — F0390 Unspecified dementia without behavioral disturbance: Secondary | ICD-10-CM

## 2018-09-02 DIAGNOSIS — E872 Acidosis: Secondary | ICD-10-CM

## 2018-09-02 DIAGNOSIS — E860 Dehydration: Secondary | ICD-10-CM | POA: Diagnosis not present

## 2018-09-02 DIAGNOSIS — R001 Bradycardia, unspecified: Secondary | ICD-10-CM | POA: Diagnosis not present

## 2018-09-02 DIAGNOSIS — R55 Syncope and collapse: Secondary | ICD-10-CM | POA: Diagnosis not present

## 2018-09-02 DIAGNOSIS — I5032 Chronic diastolic (congestive) heart failure: Secondary | ICD-10-CM | POA: Diagnosis not present

## 2018-09-02 DIAGNOSIS — N179 Acute kidney failure, unspecified: Secondary | ICD-10-CM | POA: Diagnosis not present

## 2018-09-02 DIAGNOSIS — R7989 Other specified abnormal findings of blood chemistry: Secondary | ICD-10-CM

## 2018-09-02 DIAGNOSIS — R339 Retention of urine, unspecified: Secondary | ICD-10-CM | POA: Diagnosis present

## 2018-09-02 DIAGNOSIS — R778 Other specified abnormalities of plasma proteins: Secondary | ICD-10-CM | POA: Insufficient documentation

## 2018-09-02 LAB — CBC WITH DIFFERENTIAL/PLATELET
Abs Immature Granulocytes: 0 10*3/uL (ref 0.0–0.1)
BASOS ABS: 0 10*3/uL (ref 0.0–0.1)
Basophils Relative: 0 %
EOS PCT: 0 %
Eosinophils Absolute: 0 10*3/uL (ref 0.0–0.7)
HEMATOCRIT: 42.5 % (ref 36.0–46.0)
HEMOGLOBIN: 13.6 g/dL (ref 12.0–15.0)
Immature Granulocytes: 0 %
LYMPHS PCT: 6 %
Lymphs Abs: 0.7 10*3/uL (ref 0.7–4.0)
MCH: 27.8 pg (ref 26.0–34.0)
MCHC: 32 g/dL (ref 30.0–36.0)
MCV: 86.9 fL (ref 78.0–100.0)
MONO ABS: 0.6 10*3/uL (ref 0.1–1.0)
MONOS PCT: 5 %
Neutro Abs: 9.9 10*3/uL — ABNORMAL HIGH (ref 1.7–7.7)
Neutrophils Relative %: 89 %
Platelets: 206 10*3/uL (ref 150–400)
RBC: 4.89 MIL/uL (ref 3.87–5.11)
RDW: 13.1 % (ref 11.5–15.5)
WBC: 11.2 10*3/uL — AB (ref 4.0–10.5)

## 2018-09-02 LAB — CBG MONITORING, ED: GLUCOSE-CAPILLARY: 126 mg/dL — AB (ref 70–99)

## 2018-09-02 LAB — BASIC METABOLIC PANEL
ANION GAP: 16 — AB (ref 5–15)
BUN: 16 mg/dL (ref 8–23)
CO2: 22 mmol/L (ref 22–32)
Calcium: 8.9 mg/dL (ref 8.9–10.3)
Chloride: 105 mmol/L (ref 98–111)
Creatinine, Ser: 1.38 mg/dL — ABNORMAL HIGH (ref 0.44–1.00)
GFR, EST AFRICAN AMERICAN: 42 mL/min — AB (ref >60)
GFR, EST NON AFRICAN AMERICAN: 36 mL/min — AB (ref >60)
Glucose, Bld: 132 mg/dL — ABNORMAL HIGH (ref 70–99)
POTASSIUM: 3 mmol/L — AB (ref 3.5–5.1)
SODIUM: 143 mmol/L (ref 135–145)

## 2018-09-02 LAB — GLUCOSE, CAPILLARY
GLUCOSE-CAPILLARY: 89 mg/dL (ref 70–99)
GLUCOSE-CAPILLARY: 121 mg/dL — AB (ref 70–99)
Glucose-Capillary: 115 mg/dL — ABNORMAL HIGH (ref 70–99)
Glucose-Capillary: 138 mg/dL — ABNORMAL HIGH (ref 70–99)
Glucose-Capillary: 120 mg/dL — ABNORMAL HIGH (ref 70–99)

## 2018-09-02 LAB — MAGNESIUM: MAGNESIUM: 1.8 mg/dL (ref 1.7–2.4)

## 2018-09-02 LAB — URINALYSIS, ROUTINE W REFLEX MICROSCOPIC
BILIRUBIN URINE: NEGATIVE
Bacteria, UA: NONE SEEN
Glucose, UA: 50 mg/dL — AB
Ketones, ur: 5 mg/dL — AB
LEUKOCYTES UA: NEGATIVE
Nitrite: NEGATIVE
PH: 6 (ref 5.0–8.0)
Protein, ur: 30 mg/dL — AB
SPECIFIC GRAVITY, URINE: 1.006 (ref 1.005–1.030)

## 2018-09-02 LAB — ECHOCARDIOGRAM COMPLETE
Height: 65 in
WEIGHTICAEL: 1597.89 [oz_av]

## 2018-09-02 LAB — TROPONIN I: TROPONIN I: 0.04 ng/mL — AB (ref <0.03)

## 2018-09-02 LAB — LACTIC ACID, PLASMA: Lactic Acid, Venous: 1.4 mmol/L (ref 0.5–1.9)

## 2018-09-02 LAB — CREATININE, URINE, RANDOM: Creatinine, Urine: 35.58 mg/dL

## 2018-09-02 LAB — PROCALCITONIN: Procalcitonin: <0.1 ng/mL

## 2018-09-02 LAB — SODIUM, URINE, RANDOM: Sodium, Ur: 95 mmol/L

## 2018-09-02 MED ORDER — VANCOMYCIN HCL IN DEXTROSE 1-5 GM/200ML-% IV SOLN
1000.0000 mg | Freq: Once | INTRAVENOUS | Status: DC
  Filled 2018-09-02: qty 200

## 2018-09-02 MED ORDER — SODIUM CHLORIDE 0.9 % IV SOLN
1.0000 g | Freq: Once | INTRAVENOUS | Status: DC
  Filled 2018-09-02: qty 1

## 2018-09-02 MED ORDER — NEBIVOLOL HCL 10 MG PO TABS
10.0000 mg | ORAL_TABLET | Freq: Every day | ORAL | Status: DC
  Administered 2018-09-02 – 2018-09-03 (×2): 10 mg via ORAL
  Filled 2018-09-02 (×2): qty 1

## 2018-09-02 MED ORDER — AMLODIPINE BESYLATE 10 MG PO TABS
10.0000 mg | ORAL_TABLET | Freq: Every day | ORAL | Status: DC
  Administered 2018-09-02 – 2018-09-04 (×3): 10 mg via ORAL
  Filled 2018-09-02 (×3): qty 1

## 2018-09-02 MED ORDER — SODIUM CHLORIDE 0.9% FLUSH
3.0000 mL | Freq: Two times a day (BID) | INTRAVENOUS | Status: DC
  Administered 2018-09-02 – 2018-09-04 (×5): 3 mL via INTRAVENOUS

## 2018-09-02 MED ORDER — SODIUM CHLORIDE 0.9 % IV SOLN: INTRAVENOUS | Status: DC

## 2018-09-02 MED ORDER — PNEUMOCOCCAL VAC POLYVALENT 25 MCG/0.5ML IJ INJ
0.5000 mL | INJECTION | INTRAMUSCULAR | Status: DC
  Filled 2018-09-02: qty 0.5

## 2018-09-02 MED ORDER — INSULIN ASPART 100 UNIT/ML ~~LOC~~ SOLN
0.0000 [IU] | Freq: Every day | SUBCUTANEOUS | Status: DC
  Administered 2018-09-03: 2 [IU] via SUBCUTANEOUS

## 2018-09-02 MED ORDER — METRONIDAZOLE IN NACL 5-0.79 MG/ML-% IV SOLN: 500.0000 mg | Freq: Three times a day (TID) | INTRAVENOUS | Status: DC

## 2018-09-02 MED ORDER — SODIUM CHLORIDE 0.9 % IV SOLN
INTRAVENOUS | Status: AC
  Administered 2018-09-02: 100 mL/h via INTRAVENOUS

## 2018-09-02 MED ORDER — HEPARIN SODIUM (PORCINE) 5000 UNIT/ML IJ SOLN
5000.0000 [IU] | Freq: Three times a day (TID) | INTRAMUSCULAR | Status: DC
  Administered 2018-09-02 – 2018-09-04 (×7): 5000 [IU] via SUBCUTANEOUS
  Filled 2018-09-02 (×7): qty 1

## 2018-09-02 MED ORDER — VANCOMYCIN HCL 500 MG IV SOLR: 500.0000 mg | INTRAVENOUS | Status: DC

## 2018-09-02 MED ORDER — HYDROCODONE-ACETAMINOPHEN 5-325 MG PO TABS: 1.0000 | ORAL_TABLET | ORAL | Status: DC | PRN

## 2018-09-02 MED ORDER — ONDANSETRON HCL 4 MG PO TABS: 4.0000 mg | ORAL_TABLET | Freq: Four times a day (QID) | ORAL | Status: DC | PRN

## 2018-09-02 MED ORDER — ONDANSETRON HCL 4 MG/2ML IJ SOLN: 4.0000 mg | Freq: Four times a day (QID) | INTRAMUSCULAR | Status: DC | PRN

## 2018-09-02 MED ORDER — POTASSIUM CHLORIDE CRYS ER 20 MEQ PO TBCR
40.0000 meq | EXTENDED_RELEASE_TABLET | Freq: Two times a day (BID) | ORAL | Status: DC
  Administered 2018-09-02 – 2018-09-04 (×5): 40 meq via ORAL
  Filled 2018-09-02 (×5): qty 2

## 2018-09-02 MED ORDER — ACETAMINOPHEN 650 MG RE SUPP: 650.0000 mg | Freq: Four times a day (QID) | RECTAL | Status: DC | PRN

## 2018-09-02 MED ORDER — LEVETIRACETAM 500 MG PO TABS
1000.0000 mg | ORAL_TABLET | Freq: Two times a day (BID) | ORAL | Status: DC
  Administered 2018-09-02 – 2018-09-04 (×5): 1000 mg via ORAL
  Filled 2018-09-02 (×5): qty 2

## 2018-09-02 MED ORDER — INSULIN ASPART 100 UNIT/ML ~~LOC~~ SOLN
0.0000 [IU] | Freq: Three times a day (TID) | SUBCUTANEOUS | Status: DC
  Administered 2018-09-03: 3 [IU] via SUBCUTANEOUS
  Administered 2018-09-04: 1 [IU] via SUBCUTANEOUS

## 2018-09-02 MED ORDER — ESCITALOPRAM OXALATE 10 MG PO TABS
5.0000 mg | ORAL_TABLET | Freq: Every day | ORAL | Status: DC
  Administered 2018-09-02 – 2018-09-04 (×3): 5 mg via ORAL
  Filled 2018-09-02 (×3): qty 1

## 2018-09-02 MED ORDER — ACETAMINOPHEN 325 MG PO TABS: 650.0000 mg | ORAL_TABLET | Freq: Four times a day (QID) | ORAL | Status: DC | PRN

## 2018-09-02 MED ORDER — SODIUM CHLORIDE 0.9 % IV SOLN: 1.0000 g | INTRAVENOUS | Status: DC

## 2018-09-02 MED ORDER — SODIUM CHLORIDE 0.9 % IV SOLN
INTRAVENOUS | Status: AC
  Administered 2018-09-02: 04:00:00 via INTRAVENOUS

## 2018-09-02 MED ORDER — ASPIRIN EC 81 MG PO TBEC
81.0000 mg | DELAYED_RELEASE_TABLET | Freq: Every day | ORAL | Status: DC
  Administered 2018-09-02 – 2018-09-04 (×3): 81 mg via ORAL
  Filled 2018-09-02 (×3): qty 1

## 2018-09-02 NOTE — Progress Notes (Signed)
  Echocardiogram 2D Echocardiogram has been performed.  Dana Blair 09/02/2018, 12:28 PM

## 2018-09-02 NOTE — Progress Notes (Addendum)
PROGRESS NOTE    Dana Blair  ZOX:[id number] DOB: 05/18/1941 DOA: 09/01/2018 PCP: Deatra James, MD   Brief Narrative:  77 year old woman who lives with son, brought to the emergency department following an unresponsive episode at home.  [name] says for the last few weeks she has not been as responsive and ambulatory as normal.  [name] also said he ran out of her CHRONIC KEPPRA 1 month ago and has not gotten it filled. The patient also presented with acute urinary retention of unclear etiology.  Son says she has had "loose" stools for a while.  Chart review shows she had fecal impaction in the past-- son thinks her last "normal" BM was a month ago.  Assessment & Plan:   Principal Problem:   Syncope Active Problems:   Essential hypertension   DM2 (diabetes mellitus, type 2) (HCC)   AKI (acute kidney injury) (HCC)   Chronic diastolic congestive heart failure (HCC)   Lactic acidosis   Dementia   Seizure (HCC)   Urinary retention  Syncope vs seizure   (has not been on keppra for 1 month-- ER visit on 9/1 for seizures and she was loaded with IV keppra) -EKG showed sinus bradycardia with a rate of 54.   -Troponin slightly elevated and flat at 0.04.   -2D echo was ordered, results pending.   -Orthostatics negative/CT head negative/CXR NAD -PT and OT  Elevated lactic acid,  -? From dehydration vs seizures  Elevated troponin,  -flat  Denies chest pain.  Continue aspirin and beta-blocker  AKI due to Acute urinary retention:  -foley placed -has had foley in the past per son and has had to follow up with urology -d/c IVF as eating -recheck BMP in AM  Chronic diastolic heart failure -echo pending  History of seizure disorder Continue Keppra  Hypertension BP  -continue home meds   Type II Diabetes  Continue to hold Januvia Sliding scale insulin while in the hospital, with CBG check  Hypokalemia,  -replete -check Mg  Diarrhea -CT A/P scattered  diverticulosis. -h/o fecal impaction-- ? Diarrhea around an impaction-- CT scan reviewed but bladder in way -dg abd  DVT prophylaxis: sq heparin  Code Status: Full   Family Communication: called son Disposition Plan:  PT Eval  Consultants:   None    Subjective: No complaints  Objective: Vitals:   09/02/18 0200 09/02/18 0215 09/02/18 0230 09/02/18 0354  BP: (!) 163/79 (!) 156/76 (!) 123/110 (!) 145/77  Pulse: 76 72 70 72  Resp: 17 14 18 20   Temp:    98.6 F (37 C)  TempSrc:    Oral  SpO2: 100% 99% 99% 100%  Weight:    45.3 kg  Height:    5\' 5"  (1.651 m)    Intake/Output Summary (Last 24 hours) at 09/02/2018 1223 Last data filed at 09/02/2018 1013 Gross per 24 hour  Intake 1128 ml  Output -  Net 1128 ml   Filed Weights   09/01/18 2201 09/02/18 0354  Weight: 52 kg 45.3 kg    Examination:  General exam: Appears calm and comfortable  Respiratory system: Clear to auscultation. Respiratory effort normal. Cardiovascular system: S1 & S2 heard, RRR. No JVD, 2/6  murmurs, rubs, gallops or clicks. No pedal edema. Gastrointestinal system: Abdomen is nondistended, soft and nontender. No organomegaly or masses felt. Normal bowel sounds heard. Central nervous system: Alert and oriented to self . No new  focal neurological deficits. Extremities: Symmetric 5 x 5 power. Skin:  No rashes, lesions or ulcers Psychiatry  Mood & affect appropriate. Appears to have limited insight     Data Reviewed: I have personally reviewed following labs and imaging studies  CBC: Recent Labs  Lab 08/31/18 1116 08/31/18 1151 09/01/18 2240 09/02/18 0310  WBC  --  9.4 12.5* 11.2*  NEUTROABS  --  7.2 10.2* 9.9*  HGB 17.7* 16.3* 15.5* 13.6  HCT 52.0* 51.7* 48.4* 42.5  MCV  --  87.5 86.7 86.9  PLT  --  224 229 206   Basic Metabolic Panel: Recent Labs  Lab 08/31/18 1116 08/31/18 1151 09/01/18 2240 09/02/18 0310  NA 136 139 141 143  K 3.5 3.8 4.1 3.0*  CL 102 98 100 105  CO2  --  26 26  22   GLUCOSE 153* 146* 185* 132*  BUN 15 11 17 16   CREATININE 0.80 0.86 1.67* 1.38*  CALCIUM  --  9.4 9.7 8.9   GFR: Estimated Creatinine Clearance: 24.4 mL/min (A) (by C-G formula based on SCr of 1.38 mg/dL (H)). Liver Function Tests: Recent Labs  Lab 08/31/18 1151 09/01/18 2240  AST 22 36  ALT 11 15  ALKPHOS 83 82  BILITOT 0.7 1.4*  PROT 8.5* 8.0  ALBUMIN 4.1 3.9   No results for input(s): LIPASE, AMYLASE in the last 168 hours. Recent Labs  Lab 08/31/18 1151  AMMONIA 20   Coagulation Profile: No results for input(s): INR, PROTIME in the last 168 hours. Cardiac Enzymes: Recent Labs  Lab 09/01/18 2240 09/02/18 0310  TROPONINI 0.04* 0.04*   BNP (last 3 results) No results for input(s): PROBNP in the last 8760 hours. HbA1C: No results for input(s): HGBA1C in the last 72 hours. CBG: Recent Labs  Lab 08/31/18 1104 09/02/18 0219 09/02/18 0430 09/02/18 0811 09/02/18 1152  GLUCAP 122* 126* 115* 89 121*   Lipid Profile: No results for input(s): CHOL, HDL, LDLCALC, TRIG, CHOLHDL, LDLDIRECT in the last 72 hours. Thyroid Function Tests: No results for input(s): TSH, T4TOTAL, FREET4, T3FREE, THYROIDAB in the last 72 hours. Anemia Panel: No results for input(s): VITAMINB12, FOLATE, FERRITIN, TIBC, IRON, RETICCTPCT in the last 72 hours. Sepsis Labs: Recent Labs  Lab 08/31/18 1122 09/01/18 2240 09/02/18 0310 09/02/18 0630  PROCALCITON  --   --  <0.10  --   LATICACIDVEN 4.78* 3.5*  --  1.4    Recent Results (from the past 240 hour(s))  Urine culture     Status: Abnormal   Collection Time: 08/31/18 10:40 AM  Result Value Ref Range Status   Specimen Description URINE, RANDOM  Final   Special Requests   Final    NONE Performed at [facility], [address] [place], [place] [address]    Culture MULTIPLE SPECIES PRESENT, SUGGEST RECOLLECTION (A)  Final   Report Status 09/01/2018 FINAL  Final         Radiology Studies: Ct Abdomen Pelvis Wo  Contrast  Result Date: 09/02/2018 CLINICAL DATA:  Syncope and diarrhea for several weeks. EXAM: CT ABDOMEN AND PELVIS WITHOUT CONTRAST TECHNIQUE: Multidetector CT imaging of the abdomen and pelvis was performed following the standard protocol without IV contrast. COMPARISON:  12/29/2016 CT FINDINGS: Lower chest: Top-normal size heart with thoracic aortic atherosclerosis and coronary arteriosclerosis. Subsegmental atelectasis at the left lung base and/or scarring with dependent atelectasis at the right lung base. No effusion or pneumothorax. Hepatobiliary: Small hypodensity in the right hepatic lobe statistically consistent with a cyst but too small to further characterize measuring approximately 5 mm. Additional  stable 17 mm hypodensity in the right hepatic lobe likely reflecting cyst. Gallbladder is difficult to visualize but is free of stones. No biliary dilatation. Common duct is unremarkable. Pancreas: No pancreatic ductal dilatation or mass given limitations of a noncontrast study. Spleen: Normal size spleen without mass. Adrenals/Urinary Tract: Normal bilateral adrenal glands. Moderate dilatation of both ureters and intrarenal collecting systems to the level of the bladder where there is marked bladder distention noted. Findings may be secondary to fullness of the bladder causing inability to empty the ureters versus vesicoureteral reflux potentially. No significant cortical scarring however is identified to suggest longstanding reflux if indeed this is the case. No focal mural thickening is identified of the urinary bladder or calculus. The bladder measures 10.9 x 11.1 x 12.7 cm (volume = 805 cm^3) Stomach/Bowel: Decompressed stomach. Nondistended small and large bowel. Scattered colonic diverticulosis without acute diverticulitis. Normal-appearing appendix. Vascular/Lymphatic: Moderate aortoiliac atherosclerosis. No adenopathy. Reproductive: Hysterectomy.  No adnexal mass. Other: No free air nor free fluid.  Musculoskeletal: Multilevel degenerative facet arthropathy. No aggressive osseous lesions. IMPRESSION: 1. Moderate to marked bladder distention with volume of approximately 800 cc. This may be contributing to the moderate dilatation of the intrarenal collecting systems and ureters bilaterally. No obstructing calculus or mass is apparent. 2. No acute bowel obstruction or inflammation. Scattered colonic diverticulosis. 3. Stable hepatic hypodensities consistent with cysts the largest approximately 17 mm in the right hepatic lobe. Electronically Signed   By: Tollie Eth M.D.   On: 09/02/2018 00:28   X-ray Chest Pa And Lateral  Result Date: 09/02/2018 CLINICAL DATA:  77 year old female with syncope. EXAM: CHEST - 2 VIEW COMPARISON:  Chest radiograph dated 08/31/2018 FINDINGS: The lungs are clear. There is no pleural effusion pneumothorax. The cardiac silhouette is within normal limits. Osteopenia. No acute osseous pathology. Atherosclerotic calcification of the aorta. IMPRESSION: No active cardiopulmonary disease. Electronically Signed   By: Elgie Collard M.D.   On: 09/02/2018 02:19   Dg Chest 2 View  Result Date: 08/31/2018 CLINICAL DATA:  Altered mental status today.  History of dementia. EXAM: CHEST - 2 VIEW COMPARISON:  PA and lateral chest 04/19/2018. Single-view of the chest 07/26/2015. FINDINGS: The lungs are clear. Heart size is upper normal. Aortic atherosclerosis is noted. No pneumothorax or pleural effusion. No acute or focal bony abnormality. IMPRESSION: No acute disease. Atherosclerosis. Electronically Signed   By: Drusilla Kanner M.D.   On: 08/31/2018 12:46   Ct Head Wo Contrast  Result Date: 08/31/2018 CLINICAL DATA:  Pt's son called EMS after he found her in a "dazed state" in the bathroom; hx dementia but normally more alert; alert to person only,. EXAM: CT HEAD WITHOUT CONTRAST TECHNIQUE: Contiguous axial images were obtained from the base of the skull through the vertex without  intravenous contrast. COMPARISON:  06/07/2018 FINDINGS: Brain: Significant periventricular white matter changes. Mild central cortical atrophy. There is no intra or extra-axial fluid collection or mass lesion. The basilar cisterns and ventricles have a normal appearance. Vascular: There is dense atherosclerotic calcification of the internal carotid arteries. Skull: Normal. Negative for fracture or focal lesion. Sinuses/Orbits: No acute finding. Other: None. IMPRESSION: 1. Atrophy and small vessel disease. 2.  No evidence for acute intracranial abnormality. Electronically Signed   By: Norva Pavlov M.D.   On: 08/31/2018 13:02        Scheduled Meds: . amLODipine  10 mg Oral Daily  . aspirin EC  81 mg Oral Daily  . escitalopram  5 mg Oral Daily  .  heparin  5,000 Units Subcutaneous Q8H  . insulin aspart  0-5 Units Subcutaneous QHS  . insulin aspart  0-9 Units Subcutaneous TID WC  . levETIRAcetam  1,000 mg Oral BID  . nebivolol  10 mg Oral Daily  . [START ON 09/03/2018] pneumococcal 23 valent vaccine  0.5 mL Intramuscular Tomorrow-1000  . potassium chloride  40 mEq Oral BID  . sodium chloride flush  3 mL Intravenous Q12H   Continuous Infusions: . sodium chloride       LOS: 0 days      Marlin Canary DO  If 7PM-7AM, please contact night-coverage [email or web address] Password [facility] 09/02/2018, 12:23 PM

## 2018-09-02 NOTE — H&P (Signed)
History and Physical    Dana Blair:[id number] DOB: 1941-05-15 DOA: 09/01/2018  PCP: Deatra James, MD   Patient coming from: Home   Chief Complaint: Syncope, frequent urination, frequent BM's   HPI: Dana Blair is a 77 y.o. female with medical history significant for dementia, hypertension, seizure disorder, chronic diastolic CHF, and type 2 diabetes mellitus, now presenting to the emergency department following a syncopal episode.  Patient contributes some to the history, but is limited by her dementia and her son assists with history.  She has reportedly been experiencing frequent bowel movements with formed stools, and experiencing frequent urination for the past week or more.  She has been noted to have near syncopal episodes while on the toilet recently, including an episode on 08/31/2018 for which she was evaluated in the emergency department and she was suspected of possibly having a seizure.  Just prior to arrival in the ED tonight, she had been on the toilet and suffered a transient loss of consciousness, recovering awareness by the time of EMS arrival.  She denies any chest pain, abdominal pain, headache, or focal numbness or weakness.  There was no seizure-like activity observed.  Her son reports that she is at her baseline in terms of cognition. Son reports her appetite had been stable and she has not been vomiting.   ED Course: Upon arrival to the ED, patient is found to be afebrile, saturating well on room air, and with vitals otherwise stable.  EKG features a sinus bradycardia with rate 54.  CT of the abdomen and pelvis is notable for moderate to marked bladder distention with volume of approximately 800 cc and moderate dilation of the infrarenal collecting system and ureters.  Chemistry panel is notable for a creatinine of 1.67, up from 0.86 the day before.  CBC features a leukocytosis to 12,500.  Lactic acid is elevated to 3.5 and troponin is slightly elevated at 0.04.   Patient was given 500 cc of normal saline x2 in the ED and Foley catheter was placed.  She remains hemodynamically stable and will be observed for ongoing evaluation and management of syncope and urinary retention with acute kidney injury.  Review of Systems:  All other systems reviewed and apart from HPI, are negative.  Past Medical History:  Diagnosis Date  . Abnormality of gait 05/05/2016  . Acidosis   . Acute respiratory failure (HCC)   . Atrial flutter (HCC) 05/05/2016  . Atrioventricular bloc first degree   . Chronic diastolic heart failure (HCC)   . Chronic kidney disease   . Debility 05/05/2016  . Dehydration   . Dementia 05/05/2016  . Diabetes (HCC)    Diabetes mellitus with chronic kidney disease   . Dysphagia 05/05/2016  . Elevated white blood cell count   . Epilepsy with status epilepticus, not intractable (HCC)   . Glaucoma   . Hydronephrosis   . Hyperlipidemia   . Hypertension    [facility]- ECHO 06/29/09 - LVH, normal EF, renal ultrasound-no renal artery stenosis  . Orthostatic hypotension   . Osteopenia   . 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 She had concomitant respiratory failure. On May 22, 2008, CT of the head without contrast demonstrated a 9 x 20 mm acute hematoma within the head of the right caudate with intraventricular extension of hemorrhage. Neurosurgery consult-no ventriculostomy.  . Syncope 07/26/2015  . Unspecified severe protein-calorie malnutrition (HCC)     Past Surgical History:  Procedure Laterality  Date  . ABDOMINAL HYSTERECTOMY    . FLEXIBLE SIGMOIDOSCOPY N/A 07/30/2015   Procedure: FLEXIBLE SIGMOIDOSCOPY;  Surgeon: Dorena Cookey, MD;  Location: [facility] ENDOSCOPY;  Service: Endoscopy;  Laterality: N/A;  2 fleets enemas at 7:30 AM on July 30     reports that she has never smoked. She has never used smokeless tobacco. She reports that she does not drink alcohol or use drugs.  No Known Allergies  Family History  Problem Relation Age of  Onset  . Diabetes Mother   . Diabetes Father   . Diabetes Sister      Prior to Admission medications   Medication Sig Start Date End Date Taking? Authorizing Provider  amLODipine (NORVASC) 10 MG tablet TAKE ONE TABLET BY MOUTH ONCE DAILY TO CONTROL BLOOD PRESSURE Patient taking differently: Take 10 mg by mouth daily.  04/30/18   Rosalio Macadamia, NP  aspirin EC 81 MG tablet Take 1 tablet (81 mg total) by mouth daily. 04/30/18   Rosalio Macadamia, NP  BYSTOLIC 10 MG tablet Take 10 mg by mouth daily. 06/27/18   [provider]  escitalopram (LEXAPRO) 5 MG tablet Take 5 mg by mouth daily. 08/10/18   [provider]  hydrALAZINE (APRESOLINE) 25 MG tablet Take 1 tablet (25 mg total) by mouth every 8 (eight) hours. Patient not taking: Reported on 08/31/2018 04/21/18   Alwyn Ren, MD  JANUVIA 100 MG tablet Take 100 mg by mouth daily. 04/10/18   [provider]  levETIRAcetam (KEPPRA) 500 MG tablet TAKE TWO TABLETS BY MOUTH TWICE DAILY Patient taking differently: Take 1,000 mg by mouth 2 (two) times daily.  12/02/17   Drema Dallas, DO    Physical Exam: Vitals:   09/01/18 2230 09/01/18 2245 09/01/18 2309 09/02/18 0009  BP: (!) 158/70 124/72  (!) 169/80  Pulse: (!) 58 60  71  Resp: 20 16  18   Temp:   (!) 97.1 F (36.2 C)   TempSrc:      SpO2: 100% 100%  100%  Weight:      Height:          Constitutional: NAD, calm, frail  Eyes: PERTLA, lids and conjunctivae normal ENMT: Mucous membranes are moist. Posterior pharynx clear of any exudate or lesions.   Neck: normal, supple, no masses, no thyromegaly Respiratory: clear to auscultation bilaterally, no wheezing, no crackles. Normal respiratory effort.    Cardiovascular: S1 & S2 heard, regular rate and rhythm. No extremity edema.   Abdomen: No distension, no tenderness, soft. Bowel sounds normal.  Musculoskeletal: no clubbing / cyanosis. No joint deformity upper and lower extremities.    Skin: no significant  rashes, lesions, ulcers. Poor turgor. Neurologic: No facial asymmetry. Sensation to light touch intact. Strength 5/5 in all 4 limbs.  Psychiatric: Alert and oriented to person and place only. Calm, cooperative.     Labs on Admission: I have personally reviewed following labs and imaging studies  CBC: Recent Labs  Lab 08/31/18 1116 08/31/18 1151 09/01/18 2240  WBC  --  9.4 12.5*  NEUTROABS  --  7.2 10.2*  HGB 17.7* 16.3* 15.5*  HCT 52.0* 51.7* 48.4*  MCV  --  87.5 86.7  PLT  --  224 229   Basic Metabolic Panel: Recent Labs  Lab 08/31/18 1116 08/31/18 1151 09/01/18 2240  NA 136 139 141  K 3.5 3.8 4.1  CL 102 98 100  CO2  --  26 26  GLUCOSE 153* 146* 185*  BUN 15  11 17  CREATININE 0.80 0.86 1.67*  CALCIUM  --  9.4 9.7   GFR: Estimated Creatinine Clearance: 23.2 mL/min (A) (by C-G formula based on SCr of 1.67 mg/dL (H)). Liver Function Tests: Recent Labs  Lab 08/31/18 1151 09/01/18 2240  AST 22 36  ALT 11 15  ALKPHOS 83 82  BILITOT 0.7 1.4*  PROT 8.5* 8.0  ALBUMIN 4.1 3.9   No results for input(s): LIPASE, AMYLASE in the last 168 hours. Recent Labs  Lab 08/31/18 1151  AMMONIA 20   Coagulation Profile: No results for input(s): INR, PROTIME in the last 168 hours. Cardiac Enzymes: Recent Labs  Lab 09/01/18 2240  TROPONINI 0.04*   BNP (last 3 results) No results for input(s): PROBNP in the last 8760 hours. HbA1C: No results for input(s): HGBA1C in the last 72 hours. CBG: Recent Labs  Lab 08/31/18 1104  GLUCAP 122*   Lipid Profile: No results for input(s): CHOL, HDL, LDLCALC, TRIG, CHOLHDL, LDLDIRECT in the last 72 hours. Thyroid Function Tests: No results for input(s): TSH, T4TOTAL, FREET4, T3FREE, THYROIDAB in the last 72 hours. Anemia Panel: No results for input(s): VITAMINB12, FOLATE, FERRITIN, TIBC, IRON, RETICCTPCT in the last 72 hours. Urine analysis:    Component Value Date/Time   COLORURINE YELLOW 08/31/2018 1256   APPEARANCEUR  CLOUDY (A) 08/31/2018 1256   LABSPEC 1.008 08/31/2018 1256   PHURINE 8.0 08/31/2018 1256   GLUCOSEU NEGATIVE 08/31/2018 1256   HGBUR LARGE (A) 08/31/2018 1256   BILIRUBINUR NEGATIVE 08/31/2018 1256   KETONESUR NEGATIVE 08/31/2018 1256   PROTEINUR 30 (A) 08/31/2018 1256   UROBILINOGEN 0.2 07/29/2015 1127   NITRITE NEGATIVE 08/31/2018 1256   LEUKOCYTESUR MODERATE (A) 08/31/2018 1256   Sepsis Labs: @LABRCNTIP (procalcitonin:4,lacticidven:4) ) Recent Results (from the past 240 hour(s))  Urine culture     Status: Abnormal   Collection Time: 08/31/18 10:40 AM  Result Value Ref Range Status   Specimen Description URINE, RANDOM  Final   Special Requests   Final    NONE Performed at [facility], [address] [place], [place] [address]    Culture MULTIPLE SPECIES PRESENT, SUGGEST RECOLLECTION (A)  Final   Report Status 09/01/2018 FINAL  Final     Radiological Exams on Admission: Ct Abdomen Pelvis Wo Contrast  Result Date: 09/02/2018 CLINICAL DATA:  Syncope and diarrhea for several weeks. EXAM: CT ABDOMEN AND PELVIS WITHOUT CONTRAST TECHNIQUE: Multidetector CT imaging of the abdomen and pelvis was performed following the standard protocol without IV contrast. COMPARISON:  12/29/2016 CT FINDINGS: Lower chest: Top-normal size heart with thoracic aortic atherosclerosis and coronary arteriosclerosis. Subsegmental atelectasis at the left lung base and/or scarring with dependent atelectasis at the right lung base. No effusion or pneumothorax. Hepatobiliary: Small hypodensity in the right hepatic lobe statistically consistent with a cyst but too small to further characterize measuring approximately 5 mm. Additional stable 17 mm hypodensity in the right hepatic lobe likely reflecting cyst. Gallbladder is difficult to visualize but is free of stones. No biliary dilatation. Common duct is unremarkable. Pancreas: No pancreatic ductal dilatation or mass given limitations of a noncontrast study.  Spleen: Normal size spleen without mass. Adrenals/Urinary Tract: Normal bilateral adrenal glands. Moderate dilatation of both ureters and intrarenal collecting systems to the level of the bladder where there is marked bladder distention noted. Findings may be secondary to fullness of the bladder causing inability to empty the ureters versus vesicoureteral reflux potentially. No significant cortical scarring however is identified to suggest longstanding reflux if indeed  this is the case. No focal mural thickening is identified of the urinary bladder or calculus. The bladder measures 10.9 x 11.1 x 12.7 cm (volume = 805 cm^3) Stomach/Bowel: Decompressed stomach. Nondistended small and large bowel. Scattered colonic diverticulosis without acute diverticulitis. Normal-appearing appendix. Vascular/Lymphatic: Moderate aortoiliac atherosclerosis. No adenopathy. Reproductive: Hysterectomy.  No adnexal mass. Other: No free air nor free fluid. Musculoskeletal: Multilevel degenerative facet arthropathy. No aggressive osseous lesions. IMPRESSION: 1. Moderate to marked bladder distention with volume of approximately 800 cc. This may be contributing to the moderate dilatation of the intrarenal collecting systems and ureters bilaterally. No obstructing calculus or mass is apparent. 2. No acute bowel obstruction or inflammation. Scattered colonic diverticulosis. 3. Stable hepatic hypodensities consistent with cysts the largest approximately 17 mm in the right hepatic lobe. Electronically Signed   By: Tollie Eth M.D.   On: 09/02/2018 00:28   Dg Chest 2 View  Result Date: 08/31/2018 CLINICAL DATA:  Altered mental status today.  History of dementia. EXAM: CHEST - 2 VIEW COMPARISON:  PA and lateral chest 04/19/2018. Single-view of the chest 07/26/2015. FINDINGS: The lungs are clear. Heart size is upper normal. Aortic atherosclerosis is noted. No pneumothorax or pleural effusion. No acute or focal bony abnormality. IMPRESSION: No  acute disease. Atherosclerosis. Electronically Signed   By: Drusilla Kanner M.D.   On: 08/31/2018 12:46   Ct Head Wo Contrast  Result Date: 08/31/2018 CLINICAL DATA:  Pt's son called EMS after he found her in a "dazed state" in the bathroom; hx dementia but normally more alert; alert to person only,. EXAM: CT HEAD WITHOUT CONTRAST TECHNIQUE: Contiguous axial images were obtained from the base of the skull through the vertex without intravenous contrast. COMPARISON:  06/07/2018 FINDINGS: Brain: Significant periventricular white matter changes. Mild central cortical atrophy. There is no intra or extra-axial fluid collection or mass lesion. The basilar cisterns and ventricles have a normal appearance. Vascular: There is dense atherosclerotic calcification of the internal carotid arteries. Skull: Normal. Negative for fracture or focal lesion. Sinuses/Orbits: No acute finding. Other: None. IMPRESSION: 1. Atrophy and small vessel disease. 2.  No evidence for acute intracranial abnormality. Electronically Signed   By: Norva Pavlov M.D.   On: 08/31/2018 13:02    EKG: Independently reviewed. Sinus bradycardia (rate 54).   Assessment/Plan  1. Syncope  - Presents following a syncopal episode at home - Son reported recent episodes of near-syncope while on the toilet, with actual LOC tonight  - Occurrence while on the toilet suggests a neurally-mediated etiology, apparent dehydration may be contributing, sinus bradycardia on EKG with rate 54 and does not appear to have been slower than this on cardiac monitoring in ED - She was given a liter of NS in ED  - Check orthostatic vitals, continue cardiac monitoring, continue IVF hydration, check echocardiogram    2. Acute kidney injury  - SCr is 1.67, up from 0.86 the day prior  - She appears hypovolemic, suggesting a prerenal etiology, but is also noted to have bladder distension with hydroureter bilaterally  - Foley placed in ED and 1 liter NS given  -  Check urine chemistries, continue IVF hydration, avoid nephrotoxins, and repeat chem panel in am   3. Acute urinary retention  - [name] reports she has had polyuria for >1 week with near-syncopal episodes while on the toilet and actual syncope tonight  - She is found to have moderate-marked bladder distension on CT with dilation of ureters and infrarenal collecting systems, no obstructing  stone or lesion noted  - Foley placed in ED  - Does not appear to be on medications commonly associated with retention - Possibly d/t infection, will follow-up the pending UA and treat as indicated   4. Chronic diastolic CHF  - Appears hypovolemic on admission  - Treated in ED with 1 liter NS and will be continued on IVF hydration  - Follow daily wt and I/O's, continue beta-blocker    5. Seizure disorder  - No recent seizure activity  - Continue Keprra   6. Hypertension  - BP was elevated in ED, normalized without intervention  - Continue Norvasc and nebivolol as tolerated   7. Type II DM  - A1c was 6.5% in December 2017  - Managed with Januvia at home, held on admission   - Check CBG's and use a low-intensity SSI with Novolog while in hospital    8. Elevated lactic acid  - Lactic acid is elevated to 3.5 on admission, down from 4.78 on 9/1, and without fever  - There is a leukocytosis, UA not yet collected  - Could be secondary to dehydration  - Follow-up UA and treat as indicated, continue IVF hydration, trend lactate    9. Elevated troponin  - Troponin is slightly elevated in ED - There is no anginal complaint and no acute ischemic features noted on EKG  - Continue cardiac monitoring, continue ASA and beta-blocker, repeat troponin in am    DVT prophylaxis: sq heparin  Code Status: Full  Family Communication: Son was updated at bedside Consults called: None Admission status: Observation     Briscoe Deutscher, MD [facility] Pager [phone]  If 7PM-7AM, please contact  night-coverage [email or web address] Password [email or web address]  09/02/2018, 1:51 AM

## 2018-09-02 NOTE — Progress Notes (Signed)
Pt arrived to unit from ED on bed. Pt alert and oriented to self, time and place. No enteric precaution order on pt. Pt have a foley and incontinent of bowel. Pt denied pain at this time. Will continue to monitor for any change.

## 2018-09-02 NOTE — Progress Notes (Signed)
Pharmacy Antibiotic Note  Dana Blair is a 77 y.o. female admitted on 09/01/2018 with sepsis of unknown source. WBC 11.2, lactic acid 1.4, SCr 1.38, VSS except heart rate (low on EKG). Pharmacy has been consulted for cefepime and vancomycin dosing.   Plan: Cefepime 1g IV q24h starting 9/04 at 0830. Vancomycin 500mg  IV q24h starting 9/04 at 0830.  Collect Vanc trough 9/06 at 0800 prior to daily vanc dose. Continue to monitor Scr and WBCs. Monitor urine cultures.  Seems to be improving from admit, consider rapid de-escalation of antibiotics if infection is ruled out.   Height: 5\' 5"  (165.1 cm) Weight: 99 lb 13.9 oz (45.3 kg) IBW/kg (Calculated) : 57  Temp (24hrs), Avg:98 F (36.7 C), Min:97.1 F (36.2 C), Max:98.6 F (37 C)  Recent Labs  Lab 08/31/18 1116 08/31/18 1122 08/31/18 1151 09/01/18 2240 09/02/18 0310 09/02/18 0630  WBC  --   --  9.4 12.5* 11.2*  --   CREATININE 0.80  --  0.86 1.67* 1.38*  --   LATICACIDVEN  --  4.78*  --  3.5*  --  1.4    Estimated Creatinine Clearance: 24.4 mL/min (A) (by C-G formula based on SCr of 1.38 mg/dL (H)).    No Known Allergies  Microbiology results:  Thank you for allowing pharmacy to be a part of this patient's care.  Marian Sorrow 09/02/2018 8:11 AM

## 2018-09-03 ENCOUNTER — Encounter (HOSPITAL_COMMUNITY): Payer: Self-pay | Admitting: Internal Medicine

## 2018-09-03 DIAGNOSIS — E860 Dehydration: Secondary | ICD-10-CM | POA: Diagnosis not present

## 2018-09-03 DIAGNOSIS — N179 Acute kidney failure, unspecified: Secondary | ICD-10-CM | POA: Diagnosis not present

## 2018-09-03 DIAGNOSIS — F0390 Unspecified dementia without behavioral disturbance: Secondary | ICD-10-CM | POA: Diagnosis not present

## 2018-09-03 DIAGNOSIS — R748 Abnormal levels of other serum enzymes: Secondary | ICD-10-CM | POA: Diagnosis not present

## 2018-09-03 DIAGNOSIS — R55 Syncope and collapse: Secondary | ICD-10-CM

## 2018-09-03 DIAGNOSIS — I10 Essential (primary) hypertension: Secondary | ICD-10-CM

## 2018-09-03 LAB — GLUCOSE, CAPILLARY
GLUCOSE-CAPILLARY: 51 mg/dL — AB (ref 70–99)
GLUCOSE-CAPILLARY: 131 mg/dL — AB (ref 70–99)
GLUCOSE-CAPILLARY: 206 mg/dL — AB (ref 70–99)
GLUCOSE-CAPILLARY: 209 mg/dL — AB (ref 70–99)
Glucose-Capillary: 78 mg/dL (ref 70–99)
Glucose-Capillary: 202 mg/dL — ABNORMAL HIGH (ref 70–99)
Glucose-Capillary: 58 mg/dL — ABNORMAL LOW (ref 70–99)
Glucose-Capillary: 66 mg/dL — ABNORMAL LOW (ref 70–99)

## 2018-09-03 LAB — URINE CULTURE: Culture: NO GROWTH

## 2018-09-03 LAB — UREA NITROGEN, URINE: UREA NITROGEN UR: 191 mg/dL

## 2018-09-03 LAB — BASIC METABOLIC PANEL
Anion gap: 8 (ref 5–15)
BUN: 24 mg/dL — AB (ref 8–23)
CO2: 20 mmol/L — AB (ref 22–32)
CREATININE: 1.21 mg/dL — AB (ref 0.44–1.00)
Calcium: 7.9 mg/dL — ABNORMAL LOW (ref 8.9–10.3)
Chloride: 112 mmol/L — ABNORMAL HIGH (ref 98–111)
GFR calc Af Amer: 49 mL/min — ABNORMAL LOW (ref >60)
GFR calc non Af Amer: 42 mL/min — ABNORMAL LOW (ref >60)
Glucose, Bld: 94 mg/dL (ref 70–99)
POTASSIUM: 3.8 mmol/L (ref 3.5–5.1)
Sodium: 140 mmol/L (ref 135–145)

## 2018-09-03 LAB — CBC
HEMATOCRIT: 33.7 % — AB (ref 36.0–46.0)
Hemoglobin: 10.7 g/dL — ABNORMAL LOW (ref 12.0–15.0)
MCH: 27.5 pg (ref 26.0–34.0)
MCHC: 31.8 g/dL (ref 30.0–36.0)
MCV: 86.6 fL (ref 78.0–100.0)
PLATELETS: 176 10*3/uL (ref 150–400)
RBC: 3.89 MIL/uL (ref 3.87–5.11)
RDW: 13.3 % (ref 11.5–15.5)
WBC: 6.4 10*3/uL (ref 4.0–10.5)

## 2018-09-03 LAB — HEMOGLOBIN A1C
HEMOGLOBIN A1C: 6.1 % — AB (ref 4.8–5.6)
Mean Plasma Glucose: 128.37 mg/dL

## 2018-09-03 LAB — TROPONIN I: Troponin I: <0.03 ng/mL (ref <0.03)

## 2018-09-03 MED ORDER — NEBIVOLOL HCL 5 MG PO TABS
5.0000 mg | ORAL_TABLET | Freq: Every day | ORAL | Status: DC
  Administered 2018-09-04: 5 mg via ORAL
  Filled 2018-09-03: qty 1

## 2018-09-03 MED ORDER — DEXTROSE 50 % IV SOLN: 1.0000 | Freq: Once | INTRAVENOUS | Status: DC

## 2018-09-03 MED ORDER — MAGNESIUM HYDROXIDE 400 MG/5ML PO SUSP
30.0000 mL | Freq: Once | ORAL | Status: AC
  Administered 2018-09-03: 30 mL via ORAL
  Filled 2018-09-03: qty 30

## 2018-09-03 MED ORDER — MAGNESIUM SULFATE 2 GM/50ML IV SOLN
2.0000 g | Freq: Once | INTRAVENOUS | Status: AC
  Administered 2018-09-03: 2 g via INTRAVENOUS
  Filled 2018-09-03: qty 50

## 2018-09-03 NOTE — Progress Notes (Addendum)
[facility] PROGRESS NOTE  Dana Blair ZHY:[id number] DOB: Dec 25, 1941 DOA: 09/01/2018 PCP: Deatra James, MD  Assessment/Plan: Syncope vs seizure   (has not been on keppra for 1 month-- ER visit on 9/1 for seizures and she was loaded with IV keppra) -EKG showed sinus bradycardia with a rate of 54.   -Troponin slightly elevated and flat at 0.04.   -2D echo reveals moderate concentric hypertrophy, EF 65% with near obliteration of the cavity with systole and small IVC with complete collapse suggest hypovolemia, wall motion normal, grade 1 diastolic dysfuntion.  -significance of echo results to possible syncope unclear. Will request cardiology consult -Orthostatics negative/CT head negative/CXR NAD - await PT and OT  Elevated lactic acid,  - likely from dehydration vs seizures -resolved 09/03/18  Elevated troponin,  -flat  -no chest pain  Continue aspirin and beta-blocker  AKI due to Acute urinary retention:  -foley draining small amount clear golden urine -hx of urinary retention -may need to be discharged with foley and OP urology follow up -creatinine 1.21 >>1.38>>> 1.67. -hold nephrotoxins -recheck in am  Chronic diastolic heart failure -appears compensated.  -echo results noted above -see #1 -home meds include bystolic -continue home meds  History of seizure disorder Continue Keppra -son has not refilled med in 1 month -no seizure activity  Hypertension BP  -home meds include amlodipine, bystolic, apresoline -fair control   Type II Diabetes  Continue to hold Januvia Fair control  Obtain A1c Sliding scale insulin while in the hospital, with CBG check  Hypokalemia,  -resolved -magnesium 1.8. Will replete  Diarrhea -CT A/P scattered diverticulosis. -h/o fecal impaction-- ? Diarrhea around an impaction-- CT scan reviewed but bladder in way -milk of mag x1 -mobilize  Dementia -quite demented -appears stable at baseline -PT consult possible  placement  Code Status: full Family Communication: none present Disposition Plan: ? May benefit placement   Consultants:  [employer]master  Procedures:   Antibiotics:  HPI/Subjective: 77 year old woman who lives with son, brought to the emergency department 09/01/18 following an unresponsive episode at home.  [name] says for the last few weeks she has not been as responsive and ambulatory as normal.  [name] also said he ran out of her CHRONIC KEPPRA 1 month ago and has not gotten it filled. The patient also presented with acute urinary retention of unclear etiology.  Son says she has had "loose" stools for a while.  Chart review shows she had fecal impaction in the past-- son thinks her last "normal" BM was a month ago.  Objective: Vitals:   09/02/18 2144 09/03/18 0603  BP: (!) 111/51 (!) 119/55  Pulse: (!) 59 (!) 48  Resp: 19   Temp: 98.9 F (37.2 C) 98.2 F (36.8 C)  SpO2: 97% 100%    Intake/Output Summary (Last 24 hours) at 09/03/2018 0857 Last data filed at 09/03/2018 0500 Gross per 24 hour  Intake 2104 ml  Output -  Net 2104 ml   Filed Weights   09/01/18 2201 09/02/18 0354  Weight: 52 kg 45.3 kg    Exam:   General:  Awake alert in no acute distress. Oriented to self only  Cardiovascular: bradycardia but regular no mgr, no LE edema  Respiratory: normal effort BS clear bilaterally no wheeze  Abdomen: flat soft sluggish BS, non-tender to palpation no guarding or rebounding  Musculoskeletal: joints without swelling/erythema moves all extremities spontaneously  Data Reviewed: Basic Metabolic Panel: Recent Labs  Lab 08/31/18 1116 08/31/18 1151 09/01/18 2240 09/02/18  0310 09/02/18 1312 09/03/18 0338  NA 136 139 141 143  --  140  K 3.5 3.8 4.1 3.0*  --  3.8  CL 102 98 100 105  --  112*  CO2  --  26 26 22   --  20*  GLUCOSE 153* 146* 185* 132*  --  94  BUN 15 11 17 16   --  24*  CREATININE 0.80 0.86 1.67* 1.38*  --  1.21*  CALCIUM  --  9.4 9.7  8.9  --  7.9*  MG  --   --   --   --  1.8  --    Liver Function Tests: Recent Labs  Lab 08/31/18 1151 09/01/18 2240  AST 22 36  ALT 11 15  ALKPHOS 83 82  BILITOT 0.7 1.4*  PROT 8.5* 8.0  ALBUMIN 4.1 3.9   No results for input(s): LIPASE, AMYLASE in the last 168 hours. Recent Labs  Lab 08/31/18 1151  AMMONIA 20   CBC: Recent Labs  Lab 08/31/18 1116 08/31/18 1151 09/01/18 2240 09/02/18 0310 09/03/18 0659  WBC  --  9.4 12.5* 11.2* 6.4  NEUTROABS  --  7.2 10.2* 9.9*  --   HGB 17.7* 16.3* 15.5* 13.6 10.7*  HCT 52.0* 51.7* 48.4* 42.5 33.7*  MCV  --  87.5 86.7 86.9 86.6  PLT  --  224 229 206 176   Cardiac Enzymes: Recent Labs  Lab 09/01/18 2240 09/02/18 0310  TROPONINI 0.04* 0.04*   BNP (last 3 results) No results for input(s): BNP in the last 8760 hours.  ProBNP (last 3 results) No results for input(s): PROBNP in the last 8760 hours.  CBG: Recent Labs  Lab 09/02/18 0811 09/02/18 1152 09/02/18 1655 09/02/18 2223 09/03/18 0803  GLUCAP 89 121* 138* 120* 78    Recent Results (from the past 240 hour(s))  Urine culture     Status: Abnormal   Collection Time: 08/31/18 10:40 AM  Result Value Ref Range Status   Specimen Description URINE, RANDOM  Final   Special Requests   Final    NONE Performed at [facility], [address], [place], [place] [address]    Culture MULTIPLE SPECIES PRESENT, SUGGEST RECOLLECTION (A)  Final   Report Status 09/01/2018 FINAL  Final  Urine Culture     Status: None   Collection Time: 09/02/18  2:45 AM  Result Value Ref Range Status   Specimen Description URINE, RANDOM  Final   Special Requests NONE  Final   Culture   Final    NO GROWTH Performed at [facility], [address], [place], [place] [address]    Report Status 09/03/2018 FINAL  Final     Studies: Ct Abdomen Pelvis Wo Contrast  Result Date: 09/02/2018 CLINICAL DATA:  Syncope and diarrhea for several weeks. EXAM: CT ABDOMEN AND PELVIS WITHOUT  CONTRAST TECHNIQUE: Multidetector CT imaging of the abdomen and pelvis was performed following the standard protocol without IV contrast. COMPARISON:  12/29/2016 CT FINDINGS: Lower chest: Top-normal size heart with thoracic aortic atherosclerosis and coronary arteriosclerosis. Subsegmental atelectasis at the left lung base and/or scarring with dependent atelectasis at the right lung base. No effusion or pneumothorax. Hepatobiliary: Small hypodensity in the right hepatic lobe statistically consistent with a cyst but too small to further characterize measuring approximately 5 mm. Additional stable 17 mm hypodensity in the right hepatic lobe likely reflecting cyst. Gallbladder is difficult to visualize but is free of stones. No biliary dilatation. Common duct is  unremarkable. Pancreas: No pancreatic ductal dilatation or mass given limitations of a noncontrast study. Spleen: Normal size spleen without mass. Adrenals/Urinary Tract: Normal bilateral adrenal glands. Moderate dilatation of both ureters and intrarenal collecting systems to the level of the bladder where there is marked bladder distention noted. Findings may be secondary to fullness of the bladder causing inability to empty the ureters versus vesicoureteral reflux potentially. No significant cortical scarring however is identified to suggest longstanding reflux if indeed this is the case. No focal mural thickening is identified of the urinary bladder or calculus. The bladder measures 10.9 x 11.1 x 12.7 cm (volume = 805 cm^3) Stomach/Bowel: Decompressed stomach. Nondistended small and large bowel. Scattered colonic diverticulosis without acute diverticulitis. Normal-appearing appendix. Vascular/Lymphatic: Moderate aortoiliac atherosclerosis. No adenopathy. Reproductive: Hysterectomy.  No adnexal mass. Other: No free air nor free fluid. Musculoskeletal: Multilevel degenerative facet arthropathy. No aggressive osseous lesions. IMPRESSION: 1. Moderate to marked  bladder distention with volume of approximately 800 cc. This may be contributing to the moderate dilatation of the intrarenal collecting systems and ureters bilaterally. No obstructing calculus or mass is apparent. 2. No acute bowel obstruction or inflammation. Scattered colonic diverticulosis. 3. Stable hepatic hypodensities consistent with cysts the largest approximately 17 mm in the right hepatic lobe. Electronically Signed   By: Tollie Eth M.D.   On: 09/02/2018 00:28   X-ray Chest Pa And Lateral  Result Date: 09/02/2018 CLINICAL DATA:  77 year old female with syncope. EXAM: CHEST - 2 VIEW COMPARISON:  Chest radiograph dated 08/31/2018 FINDINGS: The lungs are clear. There is no pleural effusion pneumothorax. The cardiac silhouette is within normal limits. Osteopenia. No acute osseous pathology. Atherosclerotic calcification of the aorta. IMPRESSION: No active cardiopulmonary disease. Electronically Signed   By: Elgie Collard M.D.   On: 09/02/2018 02:19   Dg Abd Portable 1v  Result Date: 09/02/2018 CLINICAL DATA:  Abdominal pain EXAM: PORTABLE ABDOMEN - 1 VIEW COMPARISON:  None. FINDINGS: The bowel gas pattern is normal. No radio-opaque calculi or other significant radiographic abnormality are seen. IMPRESSION: Negative. Electronically Signed   By: Elige Ko   On: 09/02/2018 13:06    Scheduled Meds: . amLODipine  10 mg Oral Daily  . aspirin EC  81 mg Oral Daily  . escitalopram  5 mg Oral Daily  . heparin  5,000 Units Subcutaneous Q8H  . insulin aspart  0-5 Units Subcutaneous QHS  . insulin aspart  0-9 Units Subcutaneous TID WC  . levETIRAcetam  1,000 mg Oral BID  . nebivolol  10 mg Oral Daily  . pneumococcal 23 valent vaccine  0.5 mL Intramuscular Tomorrow-1000  . potassium chloride  40 mEq Oral BID  . sodium chloride flush  3 mL Intravenous Q12H   Continuous Infusions:  Principal Problem:   Syncope Active Problems:   Essential hypertension   DM2 (diabetes mellitus, type 2)  (HCC)   AKI (acute kidney injury) (HCC)   Chronic diastolic congestive heart failure (HCC)   Lactic acidosis   Dementia   Seizure (HCC)   Urinary retention    Time spent: 65 minutes    [facility] M  [facility]  If 7PM-7AM, please contact night-coverage at [email or web address], password [facility] 09/03/2018, 8:57 AM  LOS: 0 days

## 2018-09-03 NOTE — Evaluation (Signed)
Physical Therapy Evaluation Patient Details Name: Dana Blair MRN: [id number] DOB: Nov 12, 1941 Today's Date: 09/03/2018   History of Present Illness  77 year old woman who lives with son, brought to the emergency department following an unresponsive episode at home.  [name] says for the last few weeks she has not been as responsive and ambulatory as normal.  [name] also said he ran out of her CHRONIC KEPPRA 1 month ago and has not gotten it filled. The patient also presented with acute urinary retention of unclear etiology.  Son says she has had "loose" stools for a while.  Chart review shows she had fecal impaction in the past-- son thinks her last "normal" BM was a month ago.  Clinical Impression   Pt admitted with above diagnosis. Pt currently with functional limitations due to the deficits listed below (see PT Problem List). Per pt, she was walking independently at home; lives with son, who works, and pt must be independent to [employer] home; Presents with decr functional mobility, decr activity tolerance; At this point, we must consider SNF for dc home;  Pt will benefit from skilled PT to increase their independence and safety with mobility to allow discharge to the venue listed below.       Follow Up Recommendations SNF    Equipment Recommendations  Rolling walker with 5" wheels;3in1 (PT)    Recommendations for Other Services       Precautions / Restrictions Precautions Precautions: Fall      Mobility  Bed Mobility Overal bed mobility: Needs Assistance Bed Mobility: Supine to Sit     Supine to sit: Mod assist     General bed mobility comments: light mod handheld assist to pull to sit  Transfers Overall transfer level: Needs assistance Equipment used: Rolling walker (2 wheeled) Transfers: Sit to/from Stand Sit to Stand: Min assist         General transfer comment: multiple tries to stand before successfully getting all the way up; dependent on  momentum; min assist to rise and stabilize; very effortfull  Ambulation/Gait Ambulation/Gait assistance: Min assist Gait Distance (Feet): 30 Feet Assistive device: Rolling walker (2 wheeled);None Gait Pattern/deviations: Decreased step length - right;Trunk flexed     General Gait Details: First half of walk with RW slow with dependence o nUE support; walked from sink to chair by window without RW and noted consistent tendency to reach out for UE support on furniture  Stairs            Wheelchair Mobility    Modified Rankin (Stroke Patients Only)       Balance Overall balance assessment: Needs assistance Sitting-balance support: Feet supported Sitting balance-Leahy Scale: Fair       Standing balance-Leahy Scale: Poor Standing balance comment: Reaches out for UE support with standing activities and walking                             Pertinent Vitals/Pain Pain Assessment: No/denies pain    Home Living Family/patient expects to be discharged to:: Private residence Living Arrangements: Children Available Help at Discharge: Family;Friend(s);Other (Comment)(home alone a few hours per day) Type of Home: House Home Access: Stairs to enter Entrance Stairs-Rails: None Entrance Stairs-Number of Steps: 1 Home Layout: One level   Additional Comments: Not sure how reliable a historian she is; tells me she does not use a RW at home; will need more reliable information re: PLOF and DME at home  Prior Function Level of Independence: Needs assistance         Comments: Need more reliable information     Hand Dominance        Extremity/Trunk Assessment   Upper Extremity Assessment Upper Extremity Assessment: Generalized weakness    Lower Extremity Assessment Lower Extremity Assessment: Generalized weakness       Communication   Communication: No difficulties(however, requires prompts to continue with conversation)  Cognition Arousal/Alertness:  Awake/alert Behavior During Therapy: WFL for tasks assessed/performed Overall Cognitive Status: Within Functional Limits for tasks assessed                                        General Comments General comments (skin integrity, edema, etc.): See Vitals Flowsheets for BPS in lying, sitting, and standing at 0 minutes; unable to obtain standing 3 minutes as Ms. Dana Blair was fatigued and sat quickly    Exercises     Assessment/Plan    PT Assessment Patient needs continued PT services  PT Problem List Decreased strength;Decreased activity tolerance;Decreased balance;Decreased mobility;Decreased coordination;Decreased cognition;Decreased knowledge of use of DME;Decreased safety awareness;Decreased knowledge of precautions       PT Treatment Interventions DME instruction;Gait training;Stair training;Functional mobility training;Therapeutic activities;Therapeutic exercise;Balance training;Cognitive remediation;Patient/family education    PT Goals (Current goals can be found in the Care Plan section)  Acute Rehab PT Goals Patient Stated Goal: Did not state, but agreeable to gettign out of bed PT Goal Formulation: With patient Time For Goal Achievement: 09/17/18 Potential to Achieve Goals: Good    Frequency Min 3X/week   Barriers to discharge Decreased caregiver support Is at home alone for a few hours at a time    Co-evaluation               AM-PAC PT "6 Clicks" Daily Activity  Outcome Measure Difficulty turning over in bed (including adjusting bedclothes, sheets and blankets)?: A Little Difficulty moving from lying on back to sitting on the side of the bed? : Unable Difficulty sitting down on and standing up from a chair with arms (e.g., wheelchair, bedside commode, etc,.)?: Unable Help needed moving to and from a bed to chair (including a wheelchair)?: A Little Help needed walking in hospital room?: A Little Help needed climbing 3-5 steps with a railing? : A  Lot 6 Click Score: 13    End of Session Equipment Utilized During Treatment: Gait belt Activity Tolerance: Patient tolerated treatment well Patient left: in chair;with call bell/phone within reach;with chair alarm set Nurse Communication: Mobility status PT Visit Diagnosis: Unsteadiness on feet (R26.81);Other abnormalities of gait and mobility (R26.89);Muscle weakness (generalized) (M62.81)    Time: 1610-9604 PT Time Calculation (min) (ACUTE ONLY): 24 min   Charges:   PT Evaluation $PT Eval Low Complexity: 1 Low PT Treatments $Gait Training: 8-22 mins        Van Clines, PT  Acute Rehabilitation Services Pager [phone] Office [phone]   Levi Aland 09/03/2018, 12:02 PM

## 2018-09-03 NOTE — NC FL2 (Signed)
[place] MEDICAID FL2 LEVEL OF CARE SCREENING TOOL     IDENTIFICATION  Patient Name: Dana Blair Birthdate: [birth year] Sex: female Admission Date (Current Location): 09/01/2018  [facility] and [place] Number:  Producer, television/film/video and Address:  [facility] [place], [address] [place], [place] [address]      Provider Number: [id number]  Attending Physician Name and Address:  Barnetta Chapel, MD  Relative Name and Phone Number:  Zollie Beckers, son, [phone]    Current Level of Care: Hospital Recommended Level of Care: Skilled Nursing Facility Prior Approval Number:    Date Approved/Denied:   PASRR Number: [id number]  Discharge Plan: SNF    Current Diagnoses: Patient Active Problem List   Diagnosis Date Noted  . Urinary retention 09/02/2018  . Elevated troponin   . Hypertensive encephalopathy 04/19/2018  . 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 Seizure (HCC) 12/29/2016  . Debility 05/05/2016  . Dysphagia 05/05/2016  . Abnormality of gait 05/05/2016  . Atrial flutter (HCC) 05/05/2016  . Dementia 05/05/2016  . GERD (gastroesophageal reflux disease) 05/02/2016  . Encounter for central line placement   . Hydronephrosis   . Severe protein-calorie malnutrition (HCC) 07/28/2015  . Syncope   . Chronic diastolic congestive heart failure (HCC)   . LVH (left ventricular hypertrophy)   . Orthostatic hypotension   . Lactic acidosis   . AKI (acute kidney injury) (HCC) 07/26/2015  . Dehydration 07/26/2015  . Essential hypertension 08/13/2014  . Hyperlipidemia 08/13/2014  . History of stroke 08/13/2014  . DM2 (diabetes mellitus, type 2) (HCC) 08/13/2014  . Mild mitral regurgitation 08/13/2014  . First degree AV block 08/13/2014    Orientation RESPIRATION BLADDER Height & Weight     Self, Place  Normal Incontinent, Indwelling catheter Weight: 45.3 kg Height:  5\' 5"  (165.1 cm)  BEHAVIORAL SYMPTOMS/MOOD NEUROLOGICAL BOWEL NUTRITION STATUS      Continent  Diet(Please see DC Summary)  AMBULATORY STATUS COMMUNICATION OF NEEDS Skin   Limited Assist Verbally Normal                       Personal Care Assistance Level of Assistance  Bathing, Feeding, Dressing Bathing Assistance: Maximum assistance Feeding assistance: Limited assistance Dressing Assistance: Limited assistance     Functional Limitations Info  Sight, Hearing, Speech Sight Info: Adequate Hearing Info: Adequate Speech Info: Adequate    SPECIAL CARE FACTORS FREQUENCY  PT (By licensed PT), OT (By licensed OT)     PT Frequency: 5x/week OT Frequency: 3x/week            Contractures      Additional Factors Info  Code Status, Allergies, Insulin Sliding Scale, Isolation Precautions Code Status Info: Full Allergies Info: NKA   Insulin Sliding Scale Info: 3x daily and at bedtime Isolation Precautions Info: Enteric precautions     Current Medications (09/03/2018):  This is the current hospital active medication list Current Facility-Administered Medications  Medication Dose Route Frequency Provider Last Rate Last Dose  . acetaminophen (TYLENOL) tablet 650 mg  650 mg Oral Q6H PRN Opyd, Lavone Neri, MD       Or  . acetaminophen (TYLENOL) suppository 650 mg  650 mg Rectal Q6H PRN Opyd, Lavone Neri, MD      . amLODipine (NORVASC) tablet 10 mg  10 mg Oral Daily Opyd, Lavone Neri, MD   10 mg at 09/03/18 1039  . aspirin EC tablet 81 mg  81 mg Oral Daily Opyd, Lavone Neri, MD  81 mg at 09/03/18 1039  . escitalopram (LEXAPRO) tablet 5 mg  5 mg Oral Daily Opyd, Lavone Neri, MD   5 mg at 09/03/18 1039  . heparin injection 5,000 Units  5,000 Units Subcutaneous Q8H Opyd, Lavone Neri, MD   5,000 Units at 09/03/18 0541  . HYDROcodone-acetaminophen (NORCO/VICODIN) 5-325 MG per tablet 1-2 tablet  1-2 tablet Oral Q4H PRN Opyd, Lavone Neri, MD      . insulin aspart (novoLOG) injection 0-5 Units  0-5 Units Subcutaneous QHS Opyd, Timothy S, MD      . insulin aspart (novoLOG) injection 0-9 Units  0-9  Units Subcutaneous TID WC Opyd, Lavone Neri, MD      . levETIRAcetam (KEPPRA) tablet 1,000 mg  1,000 mg Oral BID Opyd, Lavone Neri, MD   1,000 mg at 09/03/18 1039  . nebivolol (BYSTOLIC) tablet 10 mg  10 mg Oral Daily Opyd, Lavone Neri, MD   10 mg at 09/03/18 1051  . ondansetron (ZOFRAN) tablet 4 mg  4 mg Oral Q6H PRN Opyd, Lavone Neri, MD       Or  . ondansetron (ZOFRAN) injection 4 mg  4 mg Intravenous Q6H PRN Opyd, Lavone Neri, MD      . pneumococcal 23 valent vaccine (PNU-IMMUNE) injection 0.5 mL  0.5 mL Intramuscular Tomorrow-1000 Opyd, Lavone Neri, MD      . potassium chloride SA (K-DUR,KLOR-CON) CR tablet 40 mEq  40 mEq Oral BID Marcos Eke, PA-C   40 mEq at 09/03/18 1039  . sodium chloride flush (NS) 0.9 % injection 3 mL  3 mL Intravenous Q12H Opyd, Lavone Neri, MD   3 mL at 09/02/18 1013     Discharge Medications: Please see discharge summary for a list of discharge medications.  Relevant Imaging Results:  Relevant Lab Results:   Additional Information SS#: [id number]  Mearl Latin, LCSWA

## 2018-09-03 NOTE — Progress Notes (Signed)
Pt's CBG is 51.  Pt asymptomatic,treatment given, pt eating dinner. MD notified via amion, see new orders.  [id number]

## 2018-09-03 NOTE — Progress Notes (Addendum)
3:56pm-[name] contacted CSW back. He has selected [employer]. They will begin insurance authorization.  2:36pm-CSW left voicemail for patient's son to discuss discharge plan. Also contacted daughter's number, but a gentleman answered and hung up when CSW asked for [place].   [name] Azlyn Wingler LCSW [phone]

## 2018-09-03 NOTE — Consult Note (Signed)
Cardiology Consultation:   Patient ID: Dana Blair; [id number]; 08/20/1941   Admit date: 09/01/2018 Date of Consult: 09/03/2018  Primary Care Provider: Deatra James, MD Primary Cardiologist: No primary care provider on file. Primary Electrophysiologist:  None   Patient Profile:   Dana Blair is a 77 y.o. female with a PMH of HTN, chronic diastolic CHF, seizure disorder, type 2 DM, and dementia who is being seen today for the evaluation of syncope and abnormal echo results at the request of Dr. Dartha Lodge.  History of Present Illness:   [name] presented after a syncopal episode while going to the bathroom on 09/01/18. She had reportedly been experiencing frequent loose stools and urination for quite some time. She was evaluated in the ED on 08/31/18 with AMS while going to the bathroom that was felt to be 2/2 a seizure given postictal state, negative infectious work-up, and elevated lactate. She was loaded with keppra and discharged home. No prior history of arrhythmias. She is pleasantly demented and does not recall the events surrounding her current admission to the hospital. Per chart review, there was no complaints of CP, SOB, dizziness, or lightheadedness prior to or after her syncopal event. She reportedly returned to baseline prior to EMS arrival to her house.   She was last evaluated by cardiology, Norma Fredrickson, NP, 04/2018 and was felt to be doing okay from a cardiac standpoint. Her blood pressure was stable and current regimen was continued.   At the time of this evaluation she is resting comfortably in bed without complaints. No complaints of chest pain, SOB, dizziness, lightheadedness, or palpitations at this time. She is oriented to self but not place/time.   Hospital course: BP intermittently elevated - overall improved today, bradycardic, otherwise VSS. Orthostatics were negative. Labs notable for electrolytes wnl, Cr 1.67>1.21 after IVFs, mild leukocytosis (resolved), Hgb  15.5>10.7 (likely dilutional), PLT wnl, Trop 0.04x2, lactate 3.5>1.4 after IVFs, procal negative. EKG with sinus rhythm with sinus arrhythmia and poor R wave progression. Echo with EF 65-70%, no wall motion abnormalities, G1DD, and evidence of dehydration given near obliteration of LV cavity and complete collapse of IVC with systole. CXR without acute findings. CT A/P moderate to marked bladder distention and bilateral hydroureter, no bowel obstruction or inflammation but scattered colonic diverticulosis. A foley catheter was placed for urinary obstruction. Cardiology asked to evaluate for syncope and abnormal echo.   Past Medical History:  Diagnosis Date  . Abnormality of gait 05/05/2016  . Acidosis   . Acute respiratory failure (HCC)   . Atrial flutter (HCC) 05/05/2016  . Atrioventricular bloc first degree   . Chronic diastolic heart failure (HCC)   . Chronic kidney disease   . Debility 05/05/2016  . Dehydration   . Dementia 05/05/2016  . Diabetes (HCC)    Diabetes mellitus with chronic kidney disease   . Dysphagia 05/05/2016  . Elevated white blood cell count   . Epilepsy with status epilepticus, not intractable (HCC)   . Glaucoma   . Hydronephrosis   . Hyperlipidemia   . Hypertension    [facility]- ECHO 06/29/09 - LVH, normal EF, renal ultrasound-no renal artery stenosis  . Orthostatic hypotension   . Osteopenia   . 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 She had concomitant respiratory failure. On May 22, 2008, CT of the head without contrast demonstrated a 9 x 20 mm acute hematoma within the head of the right caudate with intraventricular extension of hemorrhage. Neurosurgery  consult-no ventriculostomy.  . Syncope 07/26/2015  . Unspecified severe protein-calorie malnutrition (HCC)     Past Surgical History:  Procedure Laterality Date  . ABDOMINAL HYSTERECTOMY    . FLEXIBLE SIGMOIDOSCOPY N/A 07/30/2015   Procedure: FLEXIBLE SIGMOIDOSCOPY;  Surgeon: Dorena Cookey, MD;   Location: [facility] ENDOSCOPY;  Service: Endoscopy;  Laterality: N/A;  2 fleets enemas at 7:30 AM on July 30     Home Medications:  Prior to Admission medications   Medication Sig Start Date End Date Taking? Authorizing Provider  amLODipine (NORVASC) 10 MG tablet TAKE ONE TABLET BY MOUTH ONCE DAILY TO CONTROL BLOOD PRESSURE Patient taking differently: Take 10 mg by mouth daily.  04/30/18   Rosalio Macadamia, NP  aspirin EC 81 MG tablet Take 1 tablet (81 mg total) by mouth daily. 04/30/18   Rosalio Macadamia, NP  BYSTOLIC 10 MG tablet Take 10 mg by mouth daily. 06/27/18   [provider]  escitalopram (LEXAPRO) 5 MG tablet Take 5 mg by mouth daily. 08/10/18   [provider]  hydrALAZINE (APRESOLINE) 25 MG tablet Take 1 tablet (25 mg total) by mouth every 8 (eight) hours. Patient not taking: Reported on 08/31/2018 04/21/18   Alwyn Ren, MD  JANUVIA 100 MG tablet Take 100 mg by mouth daily. 04/10/18   [provider]  levETIRAcetam (KEPPRA) 500 MG tablet TAKE TWO TABLETS BY MOUTH TWICE DAILY Patient taking differently: Take 1,000 mg by mouth 2 (two) times daily.  12/02/17   Drema Dallas, DO    Inpatient Medications: Scheduled Meds: . amLODipine  10 mg Oral Daily  . aspirin EC  81 mg Oral Daily  . escitalopram  5 mg Oral Daily  . heparin  5,000 Units Subcutaneous Q8H  . insulin aspart  0-5 Units Subcutaneous QHS  . insulin aspart  0-9 Units Subcutaneous TID WC  . levETIRAcetam  1,000 mg Oral BID  . nebivolol  10 mg Oral Daily  . pneumococcal 23 valent vaccine  0.5 mL Intramuscular Tomorrow-1000  . potassium chloride  40 mEq Oral BID  . sodium chloride flush  3 mL Intravenous Q12H   Continuous Infusions:  PRN Meds: acetaminophen **OR** acetaminophen, HYDROcodone-acetaminophen, ondansetron **OR** ondansetron (ZOFRAN) IV  Allergies:   No Known Allergies  Social History:   Social History   Socioeconomic History  . Marital status: Married    Spouse name: Not on  file  . Number of children: Not on file  . Years of education: Not on file  . Highest education level: Not on file  Occupational History  . Occupation: retail, [employer]  Social Needs  . Financial resource strain: Not on file  . Food insecurity:    Worry: Not on file    Inability: Not on file  . Transportation needs:    Medical: Not on file    Non-medical: Not on file  Tobacco Use  . Smoking status: Never Smoker  . Smokeless tobacco: Never Used  Substance and Sexual Activity  . Alcohol use: No  . Drug use: No  . Sexual activity: Never  Lifestyle  . Physical activity:    Days per week: Not on file    Minutes per session: Not on file  . Stress: Not on file  Relationships  . Social connections:    Talks on phone: Not on file    Gets together: Not on file    Attends religious service: Not on file    Active member of club or organization: Not  on file    Attends meetings of clubs or organizations: Not on file    Relationship status: Not on file  . Intimate partner violence:    Fear of current or ex partner: Not on file    Emotionally abused: Not on file    Physically abused: Not on file    Forced sexual activity: Not on file  Other Topics Concern  . Not on file  Social History Narrative   Lives at [place] since 05/01/16   Widow   Never smoked   No advance directives    Family History:    Family History  Problem Relation Age of Onset  . Diabetes Mother   . Diabetes Father   . Diabetes Sister      ROS:  Please see the history of present illness.   All other ROS reviewed and negative.     Physical Exam/Data:   Vitals:   09/02/18 1415 09/02/18 2144 09/03/18 0603 09/03/18 1400  BP: (!) 106/58 (!) 111/51 (!) 119/55 137/74  Pulse: 64 (!) 59 (!) 48 (!) 51  Resp: 18 19  20   Temp: 98.6 F (37 C) 98.9 F (37.2 C) 98.2 F (36.8 C) 98.6 F (37 C)  TempSrc: Oral Oral Oral Oral  SpO2: 97% 97% 100% 100%  Weight:      Height:        Intake/Output Summary (Last  24 hours) at 09/03/2018 1539 Last data filed at 09/03/2018 1400 Gross per 24 hour  Intake 1404 ml  Output 200 ml  Net 1204 ml   Filed Weights   09/01/18 2201 09/02/18 0354  Weight: 52 kg 45.3 kg   Body mass index is 16.62 kg/m.  General:  Thin elderly female laying in bed in no acute distress HEENT: sclera anicteric  Neck: no JVD Vascular: No carotid bruits; distal pulses 2+ bilaterally Cardiac:  normal S1, S2; RRR; no murmurs, rubs, or gallops Lungs:  clear to auscultation bilaterally, no wheezing, rhonchi or rales  Abd: NABS, soft, nontender, no hepatomegaly Ext: no edema Musculoskeletal:  No deformities, BUE and BLE strength normal and equal Skin: warm and dry  Neuro:  CNs 2-12 intact, no focal abnormalities noted Psych:  Normal affect   EKG:  The EKG was personally reviewed and demonstrates:  sinus rhythm with sinus arrhythmia and poor R wave progression. Telemetry:  Telemetry was personally reviewed and demonstrates:  Sinus bradycardia  Relevant CV Studies: Echocardiogram 09/02/18: Study Conclusions  - Left ventricle: The cavity size was normal. There was moderate   concentric hypertrophy. Systolic function was vigorous. The   estimated ejection fraction was in the range of 65% to 70%, with   near obliteration of the cavity with systole. Mean LVOT gradient   7 mmHg. Wall motion was normal; there were no regional wall   motion abnormalities. Doppler parameters are consistent with   abnormal left ventricular relaxation (grade 1 diastolic   dysfunction). - Aortic valve: There was no regurgitation. - Mitral valve: Calcified annulus. There was no significant   regurgitation. - Right ventricle: Systolic function was mildly reduced. - Right atrium: Central venous pressure (est): 3 mm Hg. - Atrial septum: No defect or patent foramen ovale was identified. - Tricuspid valve: There was trivial regurgitation. - Pulmonic valve: There was no significant  regurgitation.  Impressions:  - Moderate LVH with normal EF. Near obliteration of LV cavity and   small IVC with complete collapse suggest hypovolemia, recommend   clinical correlation.  Laboratory Data:  Chemistry  Recent Labs  Lab 09/01/18 2240 09/02/18 0310 09/03/18 0338  NA 141 143 140  K 4.1 3.0* 3.8  CL 100 105 112*  CO2 26 22 20*  GLUCOSE 185* 132* 94  BUN 17 16 24*  CREATININE 1.67* 1.38* 1.21*  CALCIUM 9.7 8.9 7.9*  GFRNONAA 28* 36* 42*  GFRAA 33* 42* 49*  ANIONGAP 15 16* 8    Recent Labs  Lab 08/31/18 1151 09/01/18 2240  PROT 8.5* 8.0  ALBUMIN 4.1 3.9  AST 22 36  ALT 11 15  ALKPHOS 83 82  BILITOT 0.7 1.4*   Hematology Recent Labs  Lab 09/01/18 2240 09/02/18 0310 09/03/18 0659  WBC 12.5* 11.2* 6.4  RBC 5.58* 4.89 3.89  HGB 15.5* 13.6 10.7*  HCT 48.4* 42.5 33.7*  MCV 86.7 86.9 86.6  MCH 27.8 27.8 27.5  MCHC 32.0 32.0 31.8  RDW 13.1 13.1 13.3  PLT 229 206 176   Cardiac Enzymes Recent Labs  Lab 09/01/18 2240 09/02/18 0310  TROPONINI 0.04* 0.04*   No results for input(s): TROPIPOC in the last 168 hours.  BNPNo results for input(s): BNP, PROBNP in the last 168 hours.  DDimer No results for input(s): DDIMER in the last 168 hours.  Radiology/Studies:  Ct Abdomen Pelvis Wo Contrast  Result Date: 09/02/2018 CLINICAL DATA:  Syncope and diarrhea for several weeks. EXAM: CT ABDOMEN AND PELVIS WITHOUT CONTRAST TECHNIQUE: Multidetector CT imaging of the abdomen and pelvis was performed following the standard protocol without IV contrast. COMPARISON:  12/29/2016 CT FINDINGS: Lower chest: Top-normal size heart with thoracic aortic atherosclerosis and coronary arteriosclerosis. Subsegmental atelectasis at the left lung base and/or scarring with dependent atelectasis at the right lung base. No effusion or pneumothorax. Hepatobiliary: Small hypodensity in the right hepatic lobe statistically consistent with a cyst but too small to further characterize  measuring approximately 5 mm. Additional stable 17 mm hypodensity in the right hepatic lobe likely reflecting cyst. Gallbladder is difficult to visualize but is free of stones. No biliary dilatation. Common duct is unremarkable. Pancreas: No pancreatic ductal dilatation or mass given limitations of a noncontrast study. Spleen: Normal size spleen without mass. Adrenals/Urinary Tract: Normal bilateral adrenal glands. Moderate dilatation of both ureters and intrarenal collecting systems to the level of the bladder where there is marked bladder distention noted. Findings may be secondary to fullness of the bladder causing inability to empty the ureters versus vesicoureteral reflux potentially. No significant cortical scarring however is identified to suggest longstanding reflux if indeed this is the case. No focal mural thickening is identified of the urinary bladder or calculus. The bladder measures 10.9 x 11.1 x 12.7 cm (volume = 805 cm^3) Stomach/Bowel: Decompressed stomach. Nondistended small and large bowel. Scattered colonic diverticulosis without acute diverticulitis. Normal-appearing appendix. Vascular/Lymphatic: Moderate aortoiliac atherosclerosis. No adenopathy. Reproductive: Hysterectomy.  No adnexal mass. Other: No free air nor free fluid. Musculoskeletal: Multilevel degenerative facet arthropathy. No aggressive osseous lesions. IMPRESSION: 1. Moderate to marked bladder distention with volume of approximately 800 cc. This may be contributing to the moderate dilatation of the intrarenal collecting systems and ureters bilaterally. No obstructing calculus or mass is apparent. 2. No acute bowel obstruction or inflammation. Scattered colonic diverticulosis. 3. Stable hepatic hypodensities consistent with cysts the largest approximately 17 mm in the right hepatic lobe. Electronically Signed   By: Tollie Eth M.D.   On: 09/02/2018 00:28   X-ray Chest Pa And Lateral  Result Date: 09/02/2018 CLINICAL DATA:   77 year old female with syncope. EXAM: CHEST - 2  VIEW COMPARISON:  Chest radiograph dated 08/31/2018 FINDINGS: The lungs are clear. There is no pleural effusion pneumothorax. The cardiac silhouette is within normal limits. Osteopenia. No acute osseous pathology. Atherosclerotic calcification of the aorta. IMPRESSION: No active cardiopulmonary disease. Electronically Signed   By: Elgie Collard M.D.   On: 09/02/2018 02:19   Dg Chest 2 View  Result Date: 08/31/2018 CLINICAL DATA:  Altered mental status today.  History of dementia. EXAM: CHEST - 2 VIEW COMPARISON:  PA and lateral chest 04/19/2018. Single-view of the chest 07/26/2015. FINDINGS: The lungs are clear. Heart size is upper normal. Aortic atherosclerosis is noted. No pneumothorax or pleural effusion. No acute or focal bony abnormality. IMPRESSION: No acute disease. Atherosclerosis. Electronically Signed   By: Drusilla Kanner M.D.   On: 08/31/2018 12:46   Ct Head Wo Contrast  Result Date: 08/31/2018 CLINICAL DATA:  Pt's son called EMS after he found her in a "dazed state" in the bathroom; hx dementia but normally more alert; alert to person only,. EXAM: CT HEAD WITHOUT CONTRAST TECHNIQUE: Contiguous axial images were obtained from the base of the skull through the vertex without intravenous contrast. COMPARISON:  06/07/2018 FINDINGS: Brain: Significant periventricular white matter changes. Mild central cortical atrophy. There is no intra or extra-axial fluid collection or mass lesion. The basilar cisterns and ventricles have a normal appearance. Vascular: There is dense atherosclerotic calcification of the internal carotid arteries. Skull: Normal. Negative for fracture or focal lesion. Sinuses/Orbits: No acute finding. Other: None. IMPRESSION: 1. Atrophy and small vessel disease. 2.  No evidence for acute intracranial abnormality. Electronically Signed   By: Norva Pavlov M.D.   On: 08/31/2018 13:02   Dg Abd Portable 1v  Result Date:  09/02/2018 CLINICAL DATA:  Abdominal pain EXAM: PORTABLE ABDOMEN - 1 VIEW COMPARISON:  None. FINDINGS: The bowel gas pattern is normal. No radio-opaque calculi or other significant radiographic abnormality are seen. IMPRESSION: Negative. Electronically Signed   By: Elige Ko   On: 09/02/2018 13:06    Assessment and Plan:   1. Syncope: patient experienced a syncopal episode while going to the bathroom on 09/01/18. She had reportedly been experiencing frequent loose stools and urination for quite some time. No prior history of arrhythmias. Reportedly no complaints of chest pain before/after event. Trop 0.04 x2. EKG non-ischemic with sinus arrhythmia. Echo this admission with EF 65-70%, no wall motion abnormalities, moderate concentric hypertrophy (previously mild in 2017), G1DD, and evidence of dehydration given near obliteration of LV cavity and complete collapse of IVC with systole. Orthostatics were positive today and he had an elevated Cr on admission which improved with IVFs, supporting dehydration as possible contributor to syncope in addition to likely vasovagal event given efforts to go to the bathroom. Also with bradycardia which could have contributed. LV hypertrophy in the setting of dehydration likely contributed to syncope as well.  - Will decrease nebivolol to 5mg  daily given bradycardia as this could have contributed to syncope. If bradycardia persists, may need to consider alternative antihypertensive therapy  2. Mildly elevated troponin: Trop 0.04x2, EKG non-ischemic. Elevation not consistent with ACS. Echo with stable EF and no wall motion abnormalities.  - No ischemic evaluation at this time   3. HTN: has proved difficult to control in the past. Was elevated on admission and improved today.  - Continue amlodipine  - Will decrease nebivolol to 5mg  daily given bradycardia as this could have contributed to syncope. If bradycardia persists, may need to consider alternative therapy  4. AKI  in the setting of acute urinary retention and dehydration: Cr 1.67 on admission and improved to 1.21 today with IVFs and foley placement - Continue management per primary team.   5. Dementia: oriented to self only.    For questions or updates, please contact [facility] Please consult [email or web address] for contact info under Cardiology/STEMI.   Signed, Beatriz Stallion, PA-C  09/03/2018 3:39 PM [phone]

## 2018-09-03 NOTE — Clinical Social Work Note (Signed)
Clinical Social Work Assessment  Patient Details  Name: Dana Blair MRN: [id number] Date of Birth: [birth year]  Date of referral:  09/03/18               Reason for consult:  Facility Placement                Permission sought to share information with:  Facility Medical sales representative, Family Supports Permission granted to share information::  Yes, Verbal Permission Granted  Name::     Ulyess Blossom::  SNFs  Relationship::  Son  Contact Information:  [phone]  Housing/Transportation Living arrangements for the past 2 months:  Single Family Home Source of Information:  Adult Children Patient Interpreter Needed:  None Criminal Activity/Legal Involvement Pertinent to Current Situation/Hospitalization:  No - Comment as needed Significant Relationships:  Adult Children Lives with:  Adult Children Do you feel safe going back to the place where you live?  No Need for family participation in patient care:  Yes (Comment)  Care giving concerns:  CSW received consult for possible SNF placement at time of discharge. CSW spoke with patient's son, Zollie Beckers, regarding PT recommendation of SNF placement at time of discharge. He was at work and had to return CSW's call.  [name] reported that he is currently unable to care for patient at their home given patient's current physical needs and fall risk. [name] expressed understanding of PT recommendation and is agreeable to SNF placement at time of discharge. CSW to continue to follow and assist with discharge planning needs.   Social Worker assessment / plan:  CSW spoke with patient's son concerning possibility of rehab at [facility] before returning home.  Employment status:  Retired Database administrator PT Recommendations:  Skilled Nursing Facility Information / Referral to community resources:  Skilled Nursing Facility  Patient/Family's Response to care:  [name] recognizes need for rehab before returning  home and is agreeable to a SNF in [place]. Patient reported preference for [facility] since his sister had been there. CSW also discussed applying for long term facility Medicaid to see if patient could qualify.  Patient/Family's Understanding of and Emotional Response to Diagnosis, Current Treatment, and Prognosis:  Patient/family is realistic regarding therapy needs and expressed being hopeful for SNF placement. [name] expressed understanding of CSW role and discharge process as well as medical condition. No questions/concerns about plan or treatment.    Emotional Assessment Appearance:  Appears stated age Attitude/Demeanor/Rapport:  Other(Quiet, confused) Affect (typically observed):  Pleasant Orientation:  Oriented to Self, Oriented to Place Alcohol / Substance use:  Not Applicable Psych involvement (Current and /or in the community):  No (Comment)  Discharge Needs  Concerns to be addressed:  Care Coordination Readmission within the last 30 days:  No Current discharge risk:  Cognitively Impaired Barriers to Discharge:  Continued Medical Work up   [employer], LCSWA 09/03/2018, 4:03 PM

## 2018-09-04 DIAGNOSIS — I5032 Chronic diastolic (congestive) heart failure: Secondary | ICD-10-CM

## 2018-09-04 DIAGNOSIS — N179 Acute kidney failure, unspecified: Secondary | ICD-10-CM | POA: Diagnosis not present

## 2018-09-04 DIAGNOSIS — R55 Syncope and collapse: Secondary | ICD-10-CM | POA: Diagnosis not present

## 2018-09-04 DIAGNOSIS — I10 Essential (primary) hypertension: Secondary | ICD-10-CM | POA: Diagnosis not present

## 2018-09-04 DIAGNOSIS — R001 Bradycardia, unspecified: Secondary | ICD-10-CM

## 2018-09-04 LAB — GLUCOSE, CAPILLARY
GLUCOSE-CAPILLARY: 143 mg/dL — AB (ref 70–99)
GLUCOSE-CAPILLARY: 114 mg/dL — AB (ref 70–99)
GLUCOSE-CAPILLARY: 109 mg/dL — AB (ref 70–99)
GLUCOSE-CAPILLARY: 134 mg/dL — AB (ref 70–99)
Glucose-Capillary: 100 mg/dL — ABNORMAL HIGH (ref 70–99)
Glucose-Capillary: 107 mg/dL — ABNORMAL HIGH (ref 70–99)
Glucose-Capillary: 131 mg/dL — ABNORMAL HIGH (ref 70–99)

## 2018-09-04 MED ORDER — NEBIVOLOL HCL 5 MG PO TABS: 5.0000 mg | ORAL_TABLET | Freq: Every day | ORAL | 0 refills | Status: DC

## 2018-09-04 NOTE — Clinical Social Work Placement (Signed)
   CLINICAL SOCIAL WORK PLACEMENT  NOTE  Date:  09/04/2018  Patient Details  Name: Dana Blair MRN: [id number] Date of Birth: 1941-06-03  Clinical Social Work is seeking post-discharge placement for this patient at the Skilled  Nursing Facility level of care (*CSW will initial, date and re-position this form in  chart as items are completed):  Yes   Patient/family provided with [facility] Clinical Social Work Department's list of facilities offering this level of care within the geographic area requested by the patient (or if unable, by the patient's family).  Yes   Patient/family informed of their freedom to choose among providers that offer the needed level of care, that participate in Medicare, Medicaid or managed care program needed by the patient, have an available bed and are willing to accept the patient.  Yes   Patient/family informed of [facility]'s ownership interest in [facility] and [facility], as well as of the fact that they are under no obligation to receive care at these facilities.  PASRR submitted to EDS on       PASRR number received on       Existing PASRR number confirmed on 09/03/18     FL2 transmitted to all facilities in geographic area requested by pt/family on 09/03/18     FL2 transmitted to all facilities within larger geographic area on       Patient informed that his/her managed care company has contracts with or will negotiate with certain facilities, including the following:        Yes   Patient/family informed of bed offers received.  Patient chooses bed at [facility]     Physician recommends and patient chooses bed at      Patient to be transferred to [facility] on 09/04/18.  Patient to be transferred to facility by [facility]     Patient family notified on 09/04/18 of transfer.  Name of family member notified:  Zollie Beckers, son     PHYSICIAN       Additional Comment:     _______________________________________________ Mearl Latin, LCSWA 09/04/2018, 1:48 PM

## 2018-09-04 NOTE — Progress Notes (Signed)
[facility] has Therapist, occupational for patient to transfer when medically ready.   [name] Amritpal Shropshire LCSW [phone]

## 2018-09-04 NOTE — Progress Notes (Addendum)
Pt to discharge to [facility] today.  Report called to Patsy. At [phone].  AVS to chart.  [id number]

## 2018-09-04 NOTE — Progress Notes (Signed)
Patient will DC to: [facility] Anticipated DC date: 09/04/18 Family notified: Son, Production designer, theatre/television/film by: Sharin Mons   Per MD patient ready for DC to . RN, patient, patient's family, and facility notified of DC. Discharge Summary sent to facility. RN given number for report [phone]). DC packet on chart. Ambulance transport requested for patient.   CSW signing off.  Cristobal Goldmann, LCSW Clinical Social Worker [phone]

## 2018-09-04 NOTE — Progress Notes (Signed)
Progress Note  Patient Name: Dana Blair Date of Encounter: 09/04/2018  Primary Cardiologist: No primary care provider on file.   Subjective   Denies any chest pain, SOB or dizziness  Inpatient Medications    Scheduled Meds: . amLODipine  10 mg Oral Daily  . aspirin EC  81 mg Oral Daily  . dextrose  1 ampule Intravenous Once  . escitalopram  5 mg Oral Daily  . heparin  5,000 Units Subcutaneous Q8H  . insulin aspart  0-5 Units Subcutaneous QHS  . insulin aspart  0-9 Units Subcutaneous TID WC  . levETIRAcetam  1,000 mg Oral BID  . nebivolol  5 mg Oral Daily  . pneumococcal 23 valent vaccine  0.5 mL Intramuscular Tomorrow-1000  . potassium chloride  40 mEq Oral BID  . sodium chloride flush  3 mL Intravenous Q12H   Continuous Infusions:  PRN Meds: acetaminophen **OR** acetaminophen, HYDROcodone-acetaminophen, ondansetron **OR** ondansetron (ZOFRAN) IV   Vital Signs    Vitals:   09/03/18 2103 09/04/18 0549 09/04/18 0700 09/04/18 1319  BP: 128/64 (!) 143/68  (!) 141/65  Pulse: (!) 57 (!) 53  (!) 58  Resp:    16  Temp: 98.5 F (36.9 C) 98.3 F (36.8 C)  98 F (36.7 C)  TempSrc:  Oral    SpO2: 99% 100%  99%  Weight:   49.5 kg   Height:        Intake/Output Summary (Last 24 hours) at 09/04/2018 1445 Last data filed at 09/04/2018 1312 Gross per 24 hour  Intake 940 ml  Output 700 ml  Net 240 ml   Filed Weights   09/01/18 2201 09/02/18 0354 09/04/18 0700  Weight: 52 kg 45.3 kg 49.5 kg    Telemetry    Sinus bradycardia in the 50's - Personally Reviewed  ECG    No new EKG to review - Personally Reviewed  Physical Exam   GEN: No acute distress.   Neck: No JVD Cardiac: RRR, no murmurs, rubs, or gallops.  Respiratory: Clear to auscultation bilaterally. GI: Soft, nontender, non-distended  MS: No edema; No deformity. Neuro:  Nonfocal  Psych: Normal affect   Labs    Chemistry Recent Labs  Lab 08/31/18 1151 09/01/18 2240 09/02/18 0310  09/03/18 0338  NA 139 141 143 140  K 3.8 4.1 3.0* 3.8  CL 98 100 105 112*  CO2 26 26 22  20*  GLUCOSE 146* 185* 132* 94  BUN 11 17 16  24*  CREATININE 0.86 1.67* 1.38* 1.21*  CALCIUM 9.4 9.7 8.9 7.9*  PROT 8.5* 8.0  --   --   ALBUMIN 4.1 3.9  --   --   AST 22 36  --   --   ALT 11 15  --   --   ALKPHOS 83 82  --   --   BILITOT 0.7 1.4*  --   --   GFRNONAA >60 28* 36* 42*  GFRAA >60 33* 42* 49*  ANIONGAP 15 15 16* 8     Hematology Recent Labs  Lab 09/01/18 2240 09/02/18 0310 09/03/18 0659  WBC 12.5* 11.2* 6.4  RBC 5.58* 4.89 3.89  HGB 15.5* 13.6 10.7*  HCT 48.4* 42.5 33.7*  MCV 86.7 86.9 86.6  MCH 27.8 27.8 27.5  MCHC 32.0 32.0 31.8  RDW 13.1 13.1 13.3  PLT 229 206 176    Cardiac Enzymes Recent Labs  Lab 09/01/18 2240 09/02/18 0310 09/03/18 1533  TROPONINI 0.04* 0.04* <0.03   No results for  input(s): TROPIPOC in the last 168 hours.   BNPNo results for input(s): BNP, PROBNP in the last 168 hours.   DDimer No results for input(s): DDIMER in the last 168 hours.   Radiology    No results found.  Cardiac Studies   2D echo 09/02/2018 Study Conclusions  - Left ventricle: The cavity size was normal. There was moderate   concentric hypertrophy. Systolic function was vigorous. The   estimated ejection fraction was in the range of 65% to 70%, with   near obliteration of the cavity with systole. Mean LVOT gradient   7 mmHg. Wall motion was normal; there were no regional wall   motion abnormalities. Doppler parameters are consistent with   abnormal left ventricular relaxation (grade 1 diastolic   dysfunction). - Aortic valve: There was no regurgitation. - Mitral valve: Calcified annulus. There was no significant   regurgitation. - Right ventricle: Systolic function was mildly reduced. - Right atrium: Central venous pressure (est): 3 mm Hg. - Atrial septum: No defect or patent foramen ovale was identified. - Tricuspid valve: There was trivial regurgitation. -  Pulmonic valve: There was no significant regurgitation.  Impressions:  - Moderate LVH with normal EF. Near obliteration of LV cavity and   small IVC with complete collapse suggest hypovolemia, recommend   clinical correlation.  Patient Profile     77 y.o. female with a history of HTN, chronic diastolic CHF, type 2 DM and dementia.  The history was mainly obtained through patient's chart as patient's dementia results in inability to gain adequate history.  Admitted with syncope after diarrhea and frequent urination.  Assessment & Plan    1. Syncope: patient experienced a syncopal episode while going to the bathroom on 09/01/18. She had reportedly been experiencing frequent loose stools and urination for quite some time. No prior history of arrhythmias. Reportedly no complaints of chest pain before/after event.  -Trop 0.04 x2.  -EKG non-ischemic with sinus arrhythmia. - Echo this admission with EF 65-70%, no wall motion abnormalities, moderate concentric hypertrophy (previously mild in 2017), G1DD, with near obliteration of LV cavity and complete collapse of IVC with systole. -Orthostatics were positive  -elevated Cr on admission which improved with IVFs, supporting dehydration as possible contributor to syncope  -Syncope likely related to orthostatic hypotension from dehydration with inability to render an appropriate heart rate response to hypotension due to BB.  She appeared hemoconcentrated and creatinine elevated on admit and improved with hydration.  She was also orthostatic on admit.  Her EF is normal and with near cavity obliteration during systole and completely collapsing IVC, all these point to low preload from dehydration -need to avoid diuretics, encourage increased PO intake, consider compression hose and avoid direct venodilators for treatment of her HTN which could drop preload and increase risk of LV cavity obliteration/obstruction during systole.   2. Mildly elevated troponin:   -Trop 0.04x2, EKG non-ischemic.  -Elevation not consistent with ACS.  -Echo with stable EF and no wall motion abnormalities.  -No ischemic evaluation at this time   3. HTN -BP controlled on exam today at 141/85mmHg -Continue amlodipine 10mg  daily -remains bradycardic so would stop Bystolic - given advanced age and problems with orthostatic hypotension I think she is ok to have a BP </= to 150/4mmHg.   -if BP trends upward could consider addition of low dose ARB if renal function stable  4. AKI in the setting of acute urinary retention and dehydration: Cr 1.67 on admission and improved to 1.21  today with IVFs and foley placement - Continue management per primary team.   5. Dementia: oriented to self only.   [facility] will sign off.   Medication Recommendations:  Amlodipine 10mg  daily Other recommendations (labs, testing, etc):  none Follow up as an outpatient:  followup with extender in our office in 1-2 weeks  For questions or updates, please contact [facility] Please consult [email or web address] for contact info under Cardiology/STEMI.      Signed, Armanda Magic, MD  09/04/2018, 2:45 PM

## 2018-09-04 NOTE — Progress Notes (Signed)
Physical Therapy Treatment Patient Details Name: Dana Blair MRN: [id number] DOB: 05/05/1941 Today's Date: 09/04/2018    History of Present Illness 77 year old woman who lives with son, brought to the emergency department following an unresponsive episode at home.  [name] says for the last few weeks she has not been as responsive and ambulatory as normal.  [name] also said he ran out of her CHRONIC KEPPRA 1 month ago and has not gotten it filled. The patient also presented with acute urinary retention of unclear etiology.  Son says she has had "loose" stools for a while.  Chart review shows she had fecal impaction in the past-- son thinks her last "normal" BM was a month ago.    PT Comments    Session focused on progressing patient's activity tolerance and functional mobility. Patient progressing towards physical therapy goals as evidenced by increased ambulation distance to 200 feet using walker and minimal assistance for walker negotiation/steering. Patient continues to display decreased orientation, poor attention, memory, and impaired ability to problem solve. Presents as a high fall risk based on cognitive and balance deficits as well as decreased gait speed. SNF remains appropriate recommendation.     Follow Up Recommendations  SNF     Equipment Recommendations  Rolling walker with 5" wheels;3in1 (PT)    Recommendations for Other Services       Precautions / Restrictions Precautions Precautions: Fall Restrictions Weight Bearing Restrictions: No    Mobility  Bed Mobility Overal bed mobility: Needs Assistance Bed Mobility: Supine to Sit     Supine to sit: Min assist     General bed mobility comments: min assist to elevate trunk to sit  Transfers Overall transfer level: Needs assistance Equipment used: Rolling walker (2 wheeled) Transfers: Sit to/from Stand Sit to Stand: Min assist         General transfer comment: dependent on momentum; min  assist to boost up and stabilize. hand over hand guidance for hand placement but patient continuously placing both hands on walker  Ambulation/Gait Ambulation/Gait assistance: Min assist Gait Distance (Feet): 200 Feet Assistive device: Rolling walker (2 wheeled) Gait Pattern/deviations: Decreased step length - right;Trunk flexed;Drifts right/left;Narrow base of support;Decreased dorsiflexion - right;Decreased dorsiflexion - left Gait velocity: decr Gait velocity interpretation: <1.31 ft/sec, indicative of household ambulator General Gait Details: patient requiring max directional cues and manual assistance for steering walker (tends to drift to the right). narrow BOS utilized and patient with decreased heel strike at initial contact likely secondary to tight gastrocs.   Stairs             Wheelchair Mobility    Modified Rankin (Stroke Patients Only)       Balance Overall balance assessment: Needs assistance Sitting-balance support: Feet supported Sitting balance-Leahy Scale: Fair       Standing balance-Leahy Scale: Poor                              Cognition Arousal/Alertness: Awake/alert Behavior During Therapy: WFL for tasks assessed/performed Overall Cognitive Status: Impaired/Different from baseline Area of Impairment: Orientation;Memory;Attention;Following commands;Safety/judgement;Awareness;Problem solving                 Orientation Level: Disoriented to;Place;Time;Situation Current Attention Level: Focused Memory: Decreased short-term memory Following Commands: Follows one step commands inconsistently;Follows one step commands with increased time Safety/Judgement: Decreased awareness of deficits Awareness: Intellectual Problem Solving: Slow processing;Decreased initiation;Requires verbal cues;Difficulty sequencing;Requires tactile cues General Comments: patient requiring  frequent redirection to task at hand with one step commands        Exercises      General Comments        Pertinent Vitals/Pain Pain Assessment: Faces Faces Pain Scale: No hurt    Home Living                      Prior Function            PT Goals (current goals can now be found in the care plan section) Acute Rehab PT Goals Patient Stated Goal: Did not state, but agreeable for out of bed mobility PT Goal Formulation: With patient Time For Goal Achievement: 09/17/18 Potential to Achieve Goals: Good Progress towards PT goals: Progressing toward goals    Frequency    Min 3X/week      PT Plan Current plan remains appropriate    Co-evaluation              AM-PAC PT "6 Clicks" Daily Activity  Outcome Measure  Difficulty turning over in bed (including adjusting bedclothes, sheets and blankets)?: A Little Difficulty moving from lying on back to sitting on the side of the bed? : Unable Difficulty sitting down on and standing up from a chair with arms (e.g., wheelchair, bedside commode, etc,.)?: Unable Help needed moving to and from a bed to chair (including a wheelchair)?: A Little Help needed walking in hospital room?: A Little Help needed climbing 3-5 steps with a railing? : A Lot 6 Click Score: 13    End of Session Equipment Utilized During Treatment: Gait belt Activity Tolerance: Patient tolerated treatment well Patient left: in chair;with call bell/phone within reach;with chair alarm set Nurse Communication: Mobility status PT Visit Diagnosis: Unsteadiness on feet (R26.81);Other abnormalities of gait and mobility (R26.89);Muscle weakness (generalized) (M62.81)     Time: 5638-9373 PT Time Calculation (min) (ACUTE ONLY): 23 min  Charges:  $Gait Training: 8-22 mins $Therapeutic Activity: 8-22 mins                     Laurina Bustle, PT, DPT Acute Rehabilitation Services Pager [phone] Office [phone]    Vanetta Mulders 09/04/2018, 12:02 PM

## 2018-09-04 NOTE — Discharge Summary (Addendum)
Physician Discharge Summary  Dana Blair KCM:[id number] DOB: 1941/08/26 DOA: 09/01/2018  PCP: Deatra James, MD  Admit date: 09/01/2018 Discharge date: 09/04/2018  Time spent: 65 minutes  Recommendations for Outpatient Follow-up:  1. Follow up with [facility] 1 week for evaluation of urinary retention and removal of foley 2. Follow up with PCP 1 week for evaluation of diabetes control and need for oral agent 3. [employer]. Monitor CBG's TID, BP and HR check bid for one week to monitor BP control as meds changed   Discharge Diagnoses:  Principal Problem:   Syncope Active Problems:   DM2 (diabetes mellitus, type 2) (HCC)   Seizure (HCC)   Urinary retention   Essential hypertension   AKI (acute kidney injury) (HCC)   Chronic diastolic congestive heart failure (HCC)   Lactic acidosis   Dementia   Discharge Condition: stable  Diet recommendation: carb modified  Filed Weights   09/01/18 2201 09/02/18 0354 09/04/18 0700  Weight: 52 kg 45.3 kg 49.5 kg    History of present illness:  77 year old womanwho lived with son,brought to the emergency department 09/01/18 following an unresponsiveepisode at home. [name] says for the last few weeks she has not been as responsive and ambulatory as normal. [name] also said he ran out of her CHRONIC KEPPRA 1 month ago and has not gotten it filled. The patient also presented with acute urinary retention of unclear etiology. Son says she has had "loose" stools for a while. Chart review shows she had fecal impaction in the past-- son thinks her last "normal" BM was a month ago.  Hospital Course:  Syncopevs seizure (had not been on keppra for 1 month-- ER visit on 9/1 for seizures and she was loaded with IV keppra) -EKG showed sinus bradycardia with a rate of 54. -Troponin slightly elevatedand flatat 0.04. -2D echo reveals moderate concentric hypertrophy, EF 65% with near obliteration of the cavity with  systole and small IVC with complete collapse suggest hypovolemia, wall motion normal, grade 1 diastolic dysfuntion.  -significance of echo results to possible syncope unclear.  -Orthostatics negative/CT head negative/CXR NAD -evaluated by cardiology who opined syncope likely related to orthostatic hypotension from dehydration with inablility to render a tachycardic response to hypotension due to BB. Cards looked at echo and opined results point to low preload from dehydration -bradycardia evaluated by cards who recommended reducing bystolic to 5mg  and discontinuing if HR did not improve. -discontinued bystolic at discharge as HR remained in 50s. Recommend monitoring BP for control   Elevated lactic acid, - likely from dehydration vs seizures -resolved 09/03/18  Elevated troponin, -flat -no chest pain  Continue aspirin   AKI due toAcute urinary retention: -hx of urinary retention -discharged with foley and OP urology follow up -creatinine 1.21 >>1.38>>> 1.67.  Chronic diastolic heart failure -remained compensated.  -echo results noted above -see #1 -bystolic discontinued at discharge due to bradycardia  History of seizure disorder Continue Keppra -son has not refilled med in 1 month -no seizure activity   HypertensionBP -home meds include amlodipine,  apresoline -fair control  Type II Diabetes Home med include Januvia which was held during hospital stay. She was on SSI and had one episode hypoglycemia. Appetite good. HgA1c 6.1. Will discontinue Januvia at discharge. Facility to check CBG's TID to evaluate need for oral agent  Hypokalemia, -resolved  Diarrhea -CT A/P scattered diverticulosis. -h/o fecal impaction-- ? Diarrhea around an impaction--  -milk of mag x1 -mobilize  Dementia -quite demented -remained  stable at baseline  Procedures:  Consultations:  cardiology  Discharge Exam: Vitals:   09/03/18 2103 09/04/18 0549  BP: 128/64 (!)  143/68  Pulse: (!) 57 (!) 53  Resp:    Temp: 98.5 F (36.9 C) 98.3 F (36.8 C)  SpO2: 99% 100%    General: lying in bed in no acute distress. alert Cardiovascular: rrr no MGR no LE edema Respiratory: normal effort BS clear bilaterally  Discharge Instructions    Allergies as of 09/04/2018   No Known Allergies     Medication List    STOP taking these medications   BYSTOLIC 10 MG tablet Generic drug:  nebivolol   JANUVIA 100 MG tablet Generic drug:  sitaGLIPtin     TAKE these medications   amLODipine 10 MG tablet Commonly known as:  NORVASC TAKE ONE TABLET BY MOUTH ONCE DAILY TO CONTROL BLOOD PRESSURE What changed:    how much to take  how to take this  when to take this  additional instructions   aspirin EC 81 MG tablet Take 1 tablet (81 mg total) by mouth daily.   escitalopram 5 MG tablet Commonly known as:  LEXAPRO Take 5 mg by mouth daily.   hydrALAZINE 25 MG tablet Commonly known as:  APRESOLINE Take 1 tablet (25 mg total) by mouth every 8 (eight) hours.   levETIRAcetam 500 MG tablet Commonly known as:  KEPPRA TAKE TWO TABLETS BY MOUTH TWICE DAILY      No Known Allergies Contact information for after-discharge care    Destination    [facility] Preferred SNF .   Service:  Skilled Nursing Contact information: [address] [place] [address] [phone]               The results of significant diagnostics from this hospitalization (including imaging, microbiology, ancillary and laboratory) are listed below for reference.    Significant Diagnostic Studies: Ct Abdomen Pelvis Wo Contrast  Result Date: 09/02/2018 CLINICAL DATA:  Syncope and diarrhea for several weeks. EXAM: CT ABDOMEN AND PELVIS WITHOUT CONTRAST TECHNIQUE: Multidetector CT imaging of the abdomen and pelvis was performed following the standard protocol without IV contrast. COMPARISON:  12/29/2016 CT FINDINGS: Lower chest: Top-normal size heart  with thoracic aortic atherosclerosis and coronary arteriosclerosis. Subsegmental atelectasis at the left lung base and/or scarring with dependent atelectasis at the right lung base. No effusion or pneumothorax. Hepatobiliary: Small hypodensity in the right hepatic lobe statistically consistent with a cyst but too small to further characterize measuring approximately 5 mm. Additional stable 17 mm hypodensity in the right hepatic lobe likely reflecting cyst. Gallbladder is difficult to visualize but is free of stones. No biliary dilatation. Common duct is unremarkable. Pancreas: No pancreatic ductal dilatation or mass given limitations of a noncontrast study. Spleen: Normal size spleen without mass. Adrenals/Urinary Tract: Normal bilateral adrenal glands. Moderate dilatation of both ureters and intrarenal collecting systems to the level of the bladder where there is marked bladder distention noted. Findings may be secondary to fullness of the bladder causing inability to empty the ureters versus vesicoureteral reflux potentially. No significant cortical scarring however is identified to suggest longstanding reflux if indeed this is the case. No focal mural thickening is identified of the urinary bladder or calculus. The bladder measures 10.9 x 11.1 x 12.7 cm (volume = 805 cm^3) Stomach/Bowel: Decompressed stomach. Nondistended small and large bowel. Scattered colonic diverticulosis without acute diverticulitis. Normal-appearing appendix. Vascular/Lymphatic: Moderate aortoiliac atherosclerosis. No adenopathy. Reproductive: Hysterectomy.  No adnexal mass. Other:  No free air nor free fluid. Musculoskeletal: Multilevel degenerative facet arthropathy. No aggressive osseous lesions. IMPRESSION: 1. Moderate to marked bladder distention with volume of approximately 800 cc. This may be contributing to the moderate dilatation of the intrarenal collecting systems and ureters bilaterally. No obstructing calculus or mass is  apparent. 2. No acute bowel obstruction or inflammation. Scattered colonic diverticulosis. 3. Stable hepatic hypodensities consistent with cysts the largest approximately 17 mm in the right hepatic lobe. Electronically Signed   By: Tollie Eth M.D.   On: 09/02/2018 00:28   X-ray Chest Pa And Lateral  Result Date: 09/02/2018 CLINICAL DATA:  77 year old female with syncope. EXAM: CHEST - 2 VIEW COMPARISON:  Chest radiograph dated 08/31/2018 FINDINGS: The lungs are clear. There is no pleural effusion pneumothorax. The cardiac silhouette is within normal limits. Osteopenia. No acute osseous pathology. Atherosclerotic calcification of the aorta. IMPRESSION: No active cardiopulmonary disease. Electronically Signed   By: Elgie Collard M.D.   On: 09/02/2018 02:19   Dg Chest 2 View  Result Date: 08/31/2018 CLINICAL DATA:  Altered mental status today.  History of dementia. EXAM: CHEST - 2 VIEW COMPARISON:  PA and lateral chest 04/19/2018. Single-view of the chest 07/26/2015. FINDINGS: The lungs are clear. Heart size is upper normal. Aortic atherosclerosis is noted. No pneumothorax or pleural effusion. No acute or focal bony abnormality. IMPRESSION: No acute disease. Atherosclerosis. Electronically Signed   By: Drusilla Kanner M.D.   On: 08/31/2018 12:46   Ct Head Wo Contrast  Result Date: 08/31/2018 CLINICAL DATA:  Pt's son called EMS after he found her in a "dazed state" in the bathroom; hx dementia but normally more alert; alert to person only,. EXAM: CT HEAD WITHOUT CONTRAST TECHNIQUE: Contiguous axial images were obtained from the base of the skull through the vertex without intravenous contrast. COMPARISON:  06/07/2018 FINDINGS: Brain: Significant periventricular white matter changes. Mild central cortical atrophy. There is no intra or extra-axial fluid collection or mass lesion. The basilar cisterns and ventricles have a normal appearance. Vascular: There is dense atherosclerotic calcification of the  internal carotid arteries. Skull: Normal. Negative for fracture or focal lesion. Sinuses/Orbits: No acute finding. Other: None. IMPRESSION: 1. Atrophy and small vessel disease. 2.  No evidence for acute intracranial abnormality. Electronically Signed   By: Norva Pavlov M.D.   On: 08/31/2018 13:02   Dg Abd Portable 1v  Result Date: 09/02/2018 CLINICAL DATA:  Abdominal pain EXAM: PORTABLE ABDOMEN - 1 VIEW COMPARISON:  None. FINDINGS: The bowel gas pattern is normal. No radio-opaque calculi or other significant radiographic abnormality are seen. IMPRESSION: Negative. Electronically Signed   By: Elige Ko   On: 09/02/2018 13:06    Microbiology: Recent Results (from the past 240 hour(s))  Urine culture     Status: Abnormal   Collection Time: 08/31/18 10:40 AM  Result Value Ref Range Status   Specimen Description URINE, RANDOM  Final   Special Requests   Final    NONE Performed at [facility], [address] [place], [place] [address]    Culture MULTIPLE SPECIES PRESENT, SUGGEST RECOLLECTION (A)  Final   Report Status 09/01/2018 FINAL  Final  Urine Culture     Status: None   Collection Time: 09/02/18  2:45 AM  Result Value Ref Range Status   Specimen Description URINE, RANDOM  Final   Special Requests NONE  Final   Culture   Final    NO GROWTH Performed at [facility], [address], [place], [place] [address]    Report Status 09/03/2018 FINAL  Final     Labs: Basic Metabolic Panel: Recent Labs  Lab 08/31/18 1116 08/31/18 1151 09/01/18 2240 09/02/18 0310 09/02/18 1312 09/03/18 0338  NA 136 139 141 143  --  140  K 3.5 3.8 4.1 3.0*  --  3.8  CL 102 98 100 105  --  112*  CO2  --  26 26 22   --  20*  GLUCOSE 153* 146* 185* 132*  --  94  BUN 15 11 17 16   --  24*  CREATININE 0.80 0.86 1.67* 1.38*  --  1.21*  CALCIUM  --  9.4 9.7 8.9  --  7.9*  MG  --   --   --   --  1.8  --    Liver Function Tests: Recent Labs  Lab 08/31/18 1151 09/01/18 2240  AST 22  36  ALT 11 15  ALKPHOS 83 82  BILITOT 0.7 1.4*  PROT 8.5* 8.0  ALBUMIN 4.1 3.9   No results for input(s): LIPASE, AMYLASE in the last 168 hours. Recent Labs  Lab 08/31/18 1151  AMMONIA 20   CBC: Recent Labs  Lab 08/31/18 1116 08/31/18 1151 09/01/18 2240 09/02/18 0310 09/03/18 0659  WBC  --  9.4 12.5* 11.2* 6.4  NEUTROABS  --  7.2 10.2* 9.9*  --   HGB 17.7* 16.3* 15.5* 13.6 10.7*  HCT 52.0* 51.7* 48.4* 42.5 33.7*  MCV  --  87.5 86.7 86.9 86.6  PLT  --  224 229 206 176   Cardiac Enzymes: Recent Labs  Lab 09/01/18 2240 09/02/18 0310 09/03/18 1533  TROPONINI 0.04* 0.04* <0.03   BNP: BNP (last 3 results) No results for input(s): BNP in the last 8760 hours.  ProBNP (last 3 results) No results for input(s): PROBNP in the last 8760 hours.  CBG: Recent Labs  Lab 09/04/18 0144 09/04/18 0259 09/04/18 0718 09/04/18 0809 09/04/18 0922  GLUCAP 100* 143* 131* 114* 109*       Signed:  Gwenyth Bender MD.  [facility] 09/04/2018, 1:05 PM

## 2018-10-31 DEATH — deceased
